# Patient Record
Sex: Female | Born: 1965 | Race: White | Hispanic: No | Marital: Married | Smoking: Never smoker
Health system: Southern US, Community
[De-identification: ages and names within clinical notes are randomized; demographics above are authoritative.]

## PROBLEM LIST (undated history)

## (undated) DIAGNOSIS — R519 Headache, unspecified: Secondary | ICD-10-CM

## (undated) DIAGNOSIS — K219 Gastro-esophageal reflux disease without esophagitis: Secondary | ICD-10-CM

## (undated) DIAGNOSIS — I10 Essential (primary) hypertension: Secondary | ICD-10-CM

## (undated) DIAGNOSIS — N920 Excessive and frequent menstruation with regular cycle: Secondary | ICD-10-CM

## (undated) DIAGNOSIS — R011 Cardiac murmur, unspecified: Secondary | ICD-10-CM

## (undated) DIAGNOSIS — F419 Anxiety disorder, unspecified: Secondary | ICD-10-CM

## (undated) DIAGNOSIS — I341 Nonrheumatic mitral (valve) prolapse: Secondary | ICD-10-CM

## (undated) DIAGNOSIS — T7840XA Allergy, unspecified, initial encounter: Secondary | ICD-10-CM

## (undated) DIAGNOSIS — N393 Stress incontinence (female) (male): Secondary | ICD-10-CM

## (undated) DIAGNOSIS — J45909 Unspecified asthma, uncomplicated: Secondary | ICD-10-CM

## (undated) DIAGNOSIS — Z960 Presence of urogenital implants: Secondary | ICD-10-CM

## (undated) DIAGNOSIS — N8110 Cystocele, unspecified: Secondary | ICD-10-CM

## (undated) DIAGNOSIS — R112 Nausea with vomiting, unspecified: Secondary | ICD-10-CM

## (undated) DIAGNOSIS — Z9889 Other specified postprocedural states: Secondary | ICD-10-CM

## (undated) DIAGNOSIS — T8859XA Other complications of anesthesia, initial encounter: Secondary | ICD-10-CM

## (undated) DIAGNOSIS — T4145XA Adverse effect of unspecified anesthetic, initial encounter: Secondary | ICD-10-CM

## (undated) HISTORY — DX: Anxiety disorder, unspecified: F41.9

## (undated) HISTORY — DX: Cardiac murmur, unspecified: R01.1

## (undated) HISTORY — DX: Essential (primary) hypertension: I10

## (undated) HISTORY — DX: Unspecified asthma, uncomplicated: J45.909

## (undated) HISTORY — PX: BREAST SURGERY: SHX581

## (undated) HISTORY — DX: Presence of urogenital implants: Z96.0

## (undated) HISTORY — DX: Excessive and frequent menstruation with regular cycle: N92.0

## (undated) HISTORY — DX: Nonrheumatic mitral (valve) prolapse: I34.1

## (undated) HISTORY — PX: FRACTURE SURGERY: SHX138

## (undated) HISTORY — PX: BREAST BIOPSY: SHX20

## (undated) HISTORY — DX: Allergy, unspecified, initial encounter: T78.40XA

## (undated) HISTORY — PX: POLYPECTOMY: SHX149

## (undated) HISTORY — DX: Stress incontinence (female) (male): N39.3

## (undated) HISTORY — PX: ABDOMINAL HYSTERECTOMY: SHX81

## (undated) HISTORY — DX: Cystocele, unspecified: N81.10

## (undated) HISTORY — PX: CHOLECYSTECTOMY: SHX55

---
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
## 2012-06-26 ENCOUNTER — Ambulatory Visit: Payer: Self-pay | Admitting: Obstetrics and Gynecology

## 2014-01-15 ENCOUNTER — Ambulatory Visit: Payer: Self-pay | Admitting: Obstetrics and Gynecology

## 2014-01-25 ENCOUNTER — Ambulatory Visit: Payer: Self-pay | Admitting: Obstetrics and Gynecology

## 2015-06-18 ENCOUNTER — Ambulatory Visit: Payer: Self-pay | Admitting: Family Medicine

## 2015-09-25 ENCOUNTER — Encounter: Payer: Self-pay | Admitting: Family Medicine

## 2015-09-25 ENCOUNTER — Ambulatory Visit (INDEPENDENT_AMBULATORY_CARE_PROVIDER_SITE_OTHER): Payer: Managed Care, Other (non HMO) | Admitting: Family Medicine

## 2015-09-25 VITALS — BP 124/78 | HR 102 | Temp 98.2°F | Resp 18 | Ht 64.0 in | Wt 201.5 lb

## 2015-09-25 DIAGNOSIS — J029 Acute pharyngitis, unspecified: Secondary | ICD-10-CM | POA: Insufficient documentation

## 2015-09-25 DIAGNOSIS — F419 Anxiety disorder, unspecified: Secondary | ICD-10-CM

## 2015-09-25 DIAGNOSIS — J302 Other seasonal allergic rhinitis: Secondary | ICD-10-CM | POA: Diagnosis not present

## 2015-09-25 DIAGNOSIS — I10 Essential (primary) hypertension: Secondary | ICD-10-CM | POA: Diagnosis not present

## 2015-09-25 DIAGNOSIS — R030 Elevated blood-pressure reading, without diagnosis of hypertension: Secondary | ICD-10-CM

## 2015-09-25 DIAGNOSIS — R011 Cardiac murmur, unspecified: Secondary | ICD-10-CM | POA: Insufficient documentation

## 2015-09-25 DIAGNOSIS — J0110 Acute frontal sinusitis, unspecified: Secondary | ICD-10-CM | POA: Diagnosis not present

## 2015-09-25 DIAGNOSIS — IMO0002 Reserved for concepts with insufficient information to code with codable children: Secondary | ICD-10-CM | POA: Insufficient documentation

## 2015-09-25 DIAGNOSIS — R059 Cough, unspecified: Secondary | ICD-10-CM | POA: Insufficient documentation

## 2015-09-25 DIAGNOSIS — J321 Chronic frontal sinusitis: Secondary | ICD-10-CM | POA: Insufficient documentation

## 2015-09-25 DIAGNOSIS — F418 Other specified anxiety disorders: Secondary | ICD-10-CM | POA: Insufficient documentation

## 2015-09-25 DIAGNOSIS — R058 Other specified cough: Secondary | ICD-10-CM | POA: Insufficient documentation

## 2015-09-25 DIAGNOSIS — J019 Acute sinusitis, unspecified: Secondary | ICD-10-CM | POA: Insufficient documentation

## 2015-09-25 DIAGNOSIS — R05 Cough: Secondary | ICD-10-CM | POA: Insufficient documentation

## 2015-09-25 DIAGNOSIS — R42 Dizziness and giddiness: Secondary | ICD-10-CM | POA: Insufficient documentation

## 2015-09-25 DIAGNOSIS — IMO0001 Reserved for inherently not codable concepts without codable children: Secondary | ICD-10-CM | POA: Insufficient documentation

## 2015-09-25 DIAGNOSIS — J45909 Unspecified asthma, uncomplicated: Secondary | ICD-10-CM | POA: Insufficient documentation

## 2015-09-25 DIAGNOSIS — J309 Allergic rhinitis, unspecified: Secondary | ICD-10-CM | POA: Insufficient documentation

## 2015-09-25 DIAGNOSIS — Z23 Encounter for immunization: Secondary | ICD-10-CM | POA: Insufficient documentation

## 2015-09-25 DIAGNOSIS — F411 Generalized anxiety disorder: Secondary | ICD-10-CM

## 2015-09-25 DIAGNOSIS — Z87898 Personal history of other specified conditions: Secondary | ICD-10-CM | POA: Insufficient documentation

## 2015-09-25 MED ORDER — ALPRAZOLAM 1 MG PO TABS: 1.0000 mg | ORAL_TABLET | Freq: Every day | ORAL | Status: DC | PRN

## 2015-09-25 MED ORDER — MONTELUKAST SODIUM 10 MG PO TABS: 10.0000 mg | ORAL_TABLET | Freq: Every day | ORAL | Status: DC

## 2015-09-25 MED ORDER — AZITHROMYCIN 250 MG PO TABS: ORAL_TABLET | ORAL | Status: DC

## 2015-09-25 MED ORDER — AMLODIPINE BESYLATE 5 MG PO TABS: 5.0000 mg | ORAL_TABLET | Freq: Every day | ORAL | Status: DC

## 2015-09-25 NOTE — Progress Notes (Signed)
Name: Mackenzie Bailey   MRN: [id number]    DOB: October 28, 1965   Date:09/25/2015       Progress Note  Subjective  Chief Complaint  Chief Complaint  Patient presents with  . URI    Sinusitis This is a new problem. The current episode started in the past 7 days. There has been no fever. Associated symptoms include congestion, headaches and sinus pressure. Pertinent negatives include no chills, shortness of breath or sore throat. Past treatments include oral decongestants.  Hypertension The problem is controlled. Associated symptoms include anxiety and headaches. Pertinent negatives include no blurred vision, chest pain, palpitations, shortness of breath or sweats. Past treatments include calcium channel blockers. The current treatment provides significant improvement. There is no history of kidney disease, CAD/MI or CVA.  Anxiety Presents for follow-up visit. Symptoms include nervous/anxious behavior and panic. Patient reports no chest pain, palpitations or shortness of breath. Symptoms occur occasionally. The severity of symptoms is mild.   Past treatments include benzodiazephines. The treatment provided moderate relief. Compliance with prior treatments has been good.    Past Medical History  Diagnosis Date  . Hypertension   . Allergy   . Anxiety   . Asthma   . Heart murmur   . Mitral valve disorder     Past Surgical History  Procedure Laterality Date  . Polypectomy      Family History  Problem Relation Age of Onset  . Arthritis Father   . Asthma Father   . Cancer Father     skin  . Cancer Sister     uterine  . Thyroid disease Sister   . Heart attack Maternal Grandfather   . Early death Maternal Grandfather     massive heart attack  . Arthritis Paternal Grandmother   . Cancer Paternal Grandmother     uterine  . Stroke Paternal Grandfather     Social History   Social History  . Marital Status: Married    Spouse Name: N/A  . Number of Children: N/A  . Years of  Education: N/A   Occupational History  . Not on file.   Social History Main Topics  . Smoking status: Never Smoker   . Smokeless tobacco: Not on file  . Alcohol Use: 0.0 oz/week    0 Standard drinks or equivalent per week     Comment: some weekends  . Drug Use: No  . Sexual Activity:    Partners: Male   Other Topics Concern  . Not on file   Social History Narrative  . No narrative on file     Current outpatient prescriptions:  .  ALPRAZolam (XANAX) 1 MG tablet, Take 1 mg by mouth at bedtime as needed for anxiety., Disp: , Rfl:  .  amLODipine (NORVASC) 5 MG tablet, , Disp: , Rfl:  .  montelukast (SINGULAIR) 10 MG tablet, Take 10 mg by mouth at bedtime., Disp: , Rfl:   Allergies  Allergen Reactions  . Penicillins Hives    Review of Systems  Constitutional: Negative for fever and chills.  HENT: Positive for congestion and sinus pressure. Negative for sore throat.   Eyes: Negative for blurred vision.  Respiratory: Positive for sputum production. Negative for shortness of breath.   Cardiovascular: Negative for chest pain and palpitations.  Neurological: Positive for headaches.  Psychiatric/Behavioral: The patient is nervous/anxious.     Objective  Filed Vitals:   09/18/15 1453 09/25/15 1442  BP: 121/84 124/78  Pulse:  102  Temp:  98.2 F (36.8  C)  TempSrc:  Oral  Resp:  18  Height:  5\' 4"  (1.626 m)  Weight:  201 lb 8 oz (91.4 kg)  SpO2:  98%    Physical Exam  Constitutional: She is oriented to person, place, and time and well-developed, well-nourished, and in no distress.  HENT:  Head: Normocephalic and atraumatic.  Right Ear: Tympanic membrane and ear canal normal.  Left Ear: Tympanic membrane and ear canal normal.  Nose: Mucosal edema present. Right sinus exhibits maxillary sinus tenderness and frontal sinus tenderness. Left sinus exhibits maxillary sinus tenderness and frontal sinus tenderness.  Mouth/Throat: Mucous membranes are normal. Posterior  oropharyngeal erythema present.  Cardiovascular: Normal rate and regular rhythm.   Pulmonary/Chest: Effort normal and breath sounds normal.  Neurological: She is alert and oriented to person, place, and time.  Psychiatric: Mood, memory, affect and judgment normal.  Nursing note and vitals reviewed.   Assessment & Plan  1. Essential hypertension BP stable and controlled on antihypertensive therapy - amLODipine (NORVASC) 5 MG tablet; Take 1 tablet (5 mg total) by mouth daily.  Dispense: 90 tablet; Refill: 0  2. Other seasonal allergic rhinitis  - montelukast (SINGULAIR) 10 MG tablet; Take 1 tablet (10 mg total) by mouth at bedtime.  Dispense: 90 tablet; Refill: 1  3. Acute frontal sinusitis, recurrence not specified Started on Z-Pak therapy for frontal sinusitis. - azithromycin (ZITHROMAX Z-PAK) 250 MG tablet; 2 tabs po x day 1, then 1 tab po q day x 4 days  Dispense: 6 each; Refill: 0  4. Anxiety Stable on alprazolam 1 mg daily as needed. Refills provided - ALPRAZolam (XANAX) 1 MG tablet; Take 1 tablet (1 mg total) by mouth daily as needed for anxiety.  Dispense: 30 tablet; Refill: 0   Mackenzie Bailey [facility] 09/25/2015 3:07 PM

## 2015-10-06 ENCOUNTER — Telehealth: Payer: Self-pay | Admitting: Family Medicine

## 2015-10-06 NOTE — Telephone Encounter (Signed)
Patient was seen by dr Manuella Ghazi about 2 weeks ago and still isn't any better. She is requesting a prescription for prednisone. Please send to [facility].

## 2015-10-08 MED ORDER — PREDNISONE 10 MG (21) PO TBPK
ORAL_TABLET | ORAL | Status: DC
Start: 2015-10-08 — End: 2015-10-30

## 2015-10-08 NOTE — Telephone Encounter (Signed)
Prednisone RX sent to Pharmacy.

## 2015-10-21 ENCOUNTER — Telehealth: Payer: Self-pay | Admitting: Family Medicine

## 2015-10-21 NOTE — Telephone Encounter (Signed)
Pt states she was here in Dec for a sick visit and her cough has not gotten any better. Pt request prednisone to be called into [facility]

## 2015-10-23 NOTE — Telephone Encounter (Signed)
Routed to Dr. Shah for approval 

## 2015-10-25 NOTE — Telephone Encounter (Signed)
Spoke with patient who explains that she is having coughing. Sinus pressure is better. Have recommended that she call on Monday 1/9 to make an appointment for Monday afternoon and will evaluate her symptoms at that time. Verbalized agreement.

## 2015-10-30 ENCOUNTER — Ambulatory Visit (INDEPENDENT_AMBULATORY_CARE_PROVIDER_SITE_OTHER): Payer: Managed Care, Other (non HMO) | Admitting: Family Medicine

## 2015-10-30 ENCOUNTER — Encounter: Payer: Self-pay | Admitting: Family Medicine

## 2015-10-30 VITALS — BP 130/78 | HR 105 | Temp 98.2°F | Resp 19 | Ht 64.0 in | Wt 203.4 lb

## 2015-10-30 DIAGNOSIS — J209 Acute bronchitis, unspecified: Secondary | ICD-10-CM

## 2015-10-30 MED ORDER — BENZONATATE 200 MG PO CAPS: 200.0000 mg | ORAL_CAPSULE | Freq: Three times a day (TID) | ORAL | Status: DC | PRN

## 2015-10-30 MED ORDER — PREDNISONE 10 MG (21) PO TBPK: 10.0000 mg | ORAL_TABLET | Freq: Every day | ORAL | Status: DC

## 2015-10-30 NOTE — Progress Notes (Signed)
Name: Mackenzie Bailey   MRN: [id number]    DOB: Feb 17, 1966   Date:10/30/2015       Progress Note  Subjective  Chief Complaint  Chief Complaint  Patient presents with  . Cough    Resp  . Hypertension  . Anxiety    Cough This is a recurrent problem. Episode onset: over 6 weeks. The problem has been unchanged. The cough is productive of sputum. Pertinent negatives include no chest pain, chills, ear pain, fever, postnasal drip, sore throat or shortness of breath. She has tried OTC cough suppressant for the symptoms. The treatment provided moderate relief.    diagnosed with acute sinusitis and started on Z-Pak 4 weeks ago. This resulted in improvement of symptoms. Past Medical History  Diagnosis Date  . Hypertension   . Allergy   . Anxiety   . Asthma   . Heart murmur   . Mitral valve disorder     Past Surgical History  Procedure Laterality Date  . Polypectomy      Family History  Problem Relation Age of Onset  . Arthritis Father   . Asthma Father   . Cancer Father     skin  . Cancer Sister     uterine  . Thyroid disease Sister   . Heart attack Maternal Grandfather   . Early death Maternal Grandfather     massive heart attack  . Arthritis Paternal Grandmother   . Cancer Paternal Grandmother     uterine  . Stroke Paternal Grandfather     Social History   Social History  . Marital Status: Married    Spouse Name: N/A  . Number of Children: N/A  . Years of Education: N/A   Occupational History  . Not on file.   Social History Main Topics  . Smoking status: Never Smoker   . Smokeless tobacco: Not on file  . Alcohol Use: 0.0 oz/week    0 Standard drinks or equivalent per week     Comment: some weekends  . Drug Use: No  . Sexual Activity:    Partners: Male   Other Topics Concern  . Not on file   Social History Narrative     Current outpatient prescriptions:  .  ALPRAZolam (XANAX) 1 MG tablet, Take 1 tablet (1 mg total) by mouth daily as needed for  anxiety., Disp: 30 tablet, Rfl: 0 .  amLODipine (NORVASC) 5 MG tablet, Take 1 tablet (5 mg total) by mouth daily., Disp: 90 tablet, Rfl: 0 .  montelukast (SINGULAIR) 10 MG tablet, Take 1 tablet (10 mg total) by mouth at bedtime., Disp: 90 tablet, Rfl: 1 .  azithromycin (ZITHROMAX Z-PAK) 250 MG tablet, 2 tabs po x day 1, then 1 tab po q day x 4 days (Patient not taking: Reported on 10/30/2015), Disp: 6 each, Rfl: 0 .  predniSONE (STERAPRED UNI-PAK 21 TAB) 10 MG (21) TBPK tablet, Use as directed by mouth is a 6 day taper PredPak (Patient not taking: Reported on 10/30/2015), Disp: 21 tablet, Rfl: 0  Allergies  Allergen Reactions  . Penicillins Hives     Review of Systems  Constitutional: Negative for fever and chills.  HENT: Negative for ear pain, postnasal drip and sore throat.   Respiratory: Positive for cough and sputum production. Negative for shortness of breath.   Cardiovascular: Negative for chest pain.    Objective  Filed Vitals:   10/30/15 1558  BP: 130/78  Pulse: 105  Temp: 98.2 F (36.8 C)  TempSrc: Oral  Resp: 19  Height: 5\' 4"  (1.626 m)  Weight: 203 lb 6.4 oz (92.262 kg)  SpO2: 97%    Physical Exam  Constitutional: She is oriented to person, place, and time and well-developed, well-nourished, and in no distress.  Cardiovascular: Normal rate and regular rhythm.   Pulmonary/Chest: Effort normal and breath sounds normal. She has no wheezes.  Neurological: She is alert and oriented to person, place, and time.  Nursing note and vitals reviewed.   Assessment & Plan  1. Acute bronchitis, unspecified organism   further ABx not indicated at this time. We'll start on 6 day course of prednisone and antitussive as needed.  - benzonatate (TESSALON) 200 MG capsule; Take 1 capsule (200 mg total) by mouth 3 (three) times daily as needed for cough.  Dispense: 30 capsule; Refill: 0 - predniSONE (STERAPRED UNI-PAK 21 TAB) 10 MG (21) TBPK tablet; Take 1 tablet (10 mg total) by  mouth daily. 60 50 40 30 20 10  then STOP  Dispense: 21 tablet; Refill: 0   Roseanne Juenger [facility] 10/30/2015 4:07 PM

## 2016-07-19 ENCOUNTER — Ambulatory Visit (INDEPENDENT_AMBULATORY_CARE_PROVIDER_SITE_OTHER): Payer: Managed Care, Other (non HMO) | Admitting: Family Medicine

## 2016-07-19 ENCOUNTER — Encounter: Payer: Self-pay | Admitting: Family Medicine

## 2016-07-19 DIAGNOSIS — F419 Anxiety disorder, unspecified: Secondary | ICD-10-CM

## 2016-07-19 DIAGNOSIS — J302 Other seasonal allergic rhinitis: Secondary | ICD-10-CM | POA: Diagnosis not present

## 2016-07-19 DIAGNOSIS — I10 Essential (primary) hypertension: Secondary | ICD-10-CM | POA: Diagnosis not present

## 2016-07-19 MED ORDER — AMLODIPINE BESYLATE 5 MG PO TABS: 5.0000 mg | ORAL_TABLET | Freq: Every day | ORAL | 0 refills | Status: DC

## 2016-07-19 MED ORDER — ALPRAZOLAM 1 MG PO TABS: 1.0000 mg | ORAL_TABLET | Freq: Every day | ORAL | 1 refills | Status: DC | PRN

## 2016-07-19 MED ORDER — MONTELUKAST SODIUM 10 MG PO TABS: 10.0000 mg | ORAL_TABLET | Freq: Every day | ORAL | 1 refills | Status: DC

## 2016-07-19 NOTE — Progress Notes (Signed)
Name: Mackenzie Bailey   MRN: [id number]    DOB: Mar 12, 1966   Date:07/19/2016       Progress Note  Subjective  Chief Complaint  Chief Complaint  Patient presents with  . Hypertension    medication refills  . Anxiety    Hypertension  This is a chronic problem. The problem is unchanged. The problem is controlled. Associated symptoms include anxiety. Pertinent negatives include no blurred vision, chest pain, palpitations or shortness of breath. Past treatments include calcium channel blockers.  Anxiety  Presents for follow-up visit. Symptoms include excessive worry, insomnia and nervous/anxious behavior. Patient reports no chest pain, palpitations or shortness of breath.      Past Medical History:  Diagnosis Date  . Allergy   . Anxiety   . Asthma   . Heart murmur   . Hypertension   . Mitral valve disorder     Past Surgical History:  Procedure Laterality Date  . POLYPECTOMY      Family History  Problem Relation Age of Onset  . Arthritis Father   . Asthma Father   . Cancer Father     skin  . Cancer Sister     uterine  . Thyroid disease Sister   . Heart attack Maternal Grandfather   . Early death Maternal Grandfather     massive heart attack  . Arthritis Paternal Grandmother   . Cancer Paternal Grandmother     uterine  . Stroke Paternal Grandfather     Social History   Social History  . Marital status: Married    Spouse name: N/A  . Number of children: N/A  . Years of education: N/A   Occupational History  . Not on file.   Social History Main Topics  . Smoking status: Never Smoker  . Smokeless tobacco: Not on file  . Alcohol use 0.0 oz/week     Comment: some weekends  . Drug use: No  . Sexual activity: Yes    Partners: Male   Other Topics Concern  . Not on file   Social History Narrative  . No narrative on file     Current Outpatient Prescriptions:  .  ALPRAZolam (XANAX) 1 MG tablet, Take 1 tablet (1 mg total) by mouth daily as needed for  anxiety., Disp: 30 tablet, Rfl: 0 .  amLODipine (NORVASC) 5 MG tablet, Take 1 tablet (5 mg total) by mouth daily., Disp: 90 tablet, Rfl: 0 .  montelukast (SINGULAIR) 10 MG tablet, Take 1 tablet (10 mg total) by mouth at bedtime., Disp: 90 tablet, Rfl: 1  Allergies  Allergen Reactions  . Penicillins Hives     Review of Systems  Eyes: Negative for blurred vision.  Respiratory: Negative for shortness of breath.   Cardiovascular: Negative for chest pain and palpitations.  Psychiatric/Behavioral: The patient is nervous/anxious and has insomnia.     Objective  Vitals:   07/19/16 1042  BP: 128/76  Pulse: 97  Resp: 16  Temp: 98.2 F (36.8 C)  TempSrc: Oral  SpO2: 97%  Weight: 197 lb 14.4 oz (89.8 kg)  Height: 5\' 4"  (1.626 m)    Physical Exam  Constitutional: She is well-developed, well-nourished, and in no distress.  HENT:  Head: Normocephalic and atraumatic.  Cardiovascular: Normal rate, regular rhythm, S1 normal, S2 normal and normal heart sounds.   No murmur heard. Pulmonary/Chest: Effort normal and breath sounds normal. She has no wheezes.  Musculoskeletal:       Right ankle: She exhibits no swelling.  Left ankle: She exhibits no swelling.  Nursing note and vitals reviewed.    Assessment & Plan  1. Anxiety  - ALPRAZolam (XANAX) 1 MG tablet; Take 1 tablet (1 mg total) by mouth daily as needed for anxiety.  Dispense: 30 tablet; Refill: 1  2. Essential hypertension  - amLODipine (NORVASC) 5 MG tablet; Take 1 tablet (5 mg total) by mouth daily.  Dispense: 90 tablet; Refill: 0  3. Other seasonal allergic rhinitis  - montelukast (SINGULAIR) 10 MG tablet; Take 1 tablet (10 mg total) by mouth at bedtime.  Dispense: 90 tablet; Refill: 1   Mackenzie Bailey [facility] 07/19/2016 10:58 AM

## 2016-09-27 ENCOUNTER — Encounter: Payer: Managed Care, Other (non HMO) | Admitting: Family Medicine

## 2016-11-09 ENCOUNTER — Encounter: Payer: Self-pay | Admitting: Family Medicine

## 2016-11-09 ENCOUNTER — Telehealth: Payer: Self-pay | Admitting: Family Medicine

## 2016-11-09 ENCOUNTER — Other Ambulatory Visit: Payer: Self-pay | Admitting: Family Medicine

## 2016-11-09 DIAGNOSIS — Z20828 Contact with and (suspected) exposure to other viral communicable diseases: Secondary | ICD-10-CM

## 2016-11-09 MED ORDER — OSELTAMIVIR PHOSPHATE 75 MG PO CAPS: 75.0000 mg | ORAL_CAPSULE | Freq: Every day | ORAL | 0 refills | Status: DC

## 2016-11-09 NOTE — Telephone Encounter (Signed)
Pt would like to know if she can get a RX for Tamiflu. 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 [employer].

## 2016-11-09 NOTE — Telephone Encounter (Signed)
Prescription for Tamiflu has been sent to your pharmacy.

## 2016-11-09 NOTE — Progress Notes (Signed)
tamiflu

## 2016-11-10 NOTE — Telephone Encounter (Signed)
LMOM to inform pt RX at pharmacy

## 2017-06-06 ENCOUNTER — Encounter: Payer: Self-pay | Admitting: Family Medicine

## 2017-06-06 ENCOUNTER — Ambulatory Visit (INDEPENDENT_AMBULATORY_CARE_PROVIDER_SITE_OTHER): Payer: Commercial Managed Care - PPO | Admitting: Family Medicine

## 2017-06-06 VITALS — BP 128/76 | HR 99 | Temp 97.6°F | Resp 16 | Ht 64.0 in | Wt 202.8 lb

## 2017-06-06 DIAGNOSIS — Z0000 Encounter for general adult medical examination without abnormal findings: Secondary | ICD-10-CM | POA: Diagnosis not present

## 2017-06-06 DIAGNOSIS — Z1211 Encounter for screening for malignant neoplasm of colon: Secondary | ICD-10-CM | POA: Diagnosis not present

## 2017-06-06 NOTE — Progress Notes (Signed)
Name: Mackenzie Bailey   MRN: [id number]    DOB: Oct 29, 1965   Date:06/06/2017       Progress Note  Subjective  Chief Complaint  Chief Complaint  Patient presents with  . Annual Exam    patient needs forms for work to be completed. sleeps about 7 hrs/ night. patient stated that she does not eat much.   . Labs Only    patient is not fasting.    HPI  Pt. presents for complete physical exam sponsored by her employer She is in otherwise good health.. She is due for first screening colonoscopy, will order today.  She has a gynecologist for Pap smear and mammogram.    Past Medical History:  Diagnosis Date  . Allergy   . Anxiety   . Asthma   . Heart murmur   . Hypertension   . Mitral valve disorder     Past Surgical History:  Procedure Laterality Date  . POLYPECTOMY      Family History  Problem Relation Age of Onset  . Arthritis Father   . Asthma Father   . Cancer Father        skin  . Cancer Sister        uterine  . Thyroid disease Sister   . Heart attack Maternal Grandfather   . Early death Maternal Grandfather        massive heart attack  . Arthritis Paternal Grandmother   . Cancer Paternal Grandmother        uterine  . Stroke Paternal Grandfather     Social History   Social History  . Marital status: Married    Spouse name: N/A  . Number of children: N/A  . Years of education: N/A   Occupational History  . Not on file.   Social History Main Topics  . Smoking status: Never Smoker  . Smokeless tobacco: Never Used  . Alcohol use 0.0 oz/week     Comment: some weekends  . Drug use: No  . Sexual activity: Yes    Partners: Male    Birth control/ protection: None   Other Topics Concern  . Not on file   Social History Narrative  . No narrative on file     Current Outpatient Prescriptions:  .  ALPRAZolam (XANAX) 1 MG tablet, Take 1 tablet (1 mg total) by mouth daily as needed for anxiety., Disp: 30 tablet, Rfl: 1 .  amLODipine (NORVASC) 5 MG tablet,  Take 1 tablet (5 mg total) by mouth daily., Disp: 90 tablet, Rfl: 0 .  montelukast (SINGULAIR) 10 MG tablet, Take 1 tablet (10 mg total) by mouth at bedtime., Disp: 90 tablet, Rfl: 1  Allergies  Allergen Reactions  . Penicillins Hives     Review of Systems  Constitutional: Positive for malaise/fatigue. Negative for chills and fever.  HENT: Negative for congestion, ear pain and sore throat.   Eyes: Negative for blurred vision and double vision.  Respiratory: Negative for cough and shortness of breath (chronic asthma, no recent flare ups).   Cardiovascular: Negative for chest pain and leg swelling.  Gastrointestinal: Negative for abdominal pain, blood in stool, constipation, diarrhea, nausea and vomiting.  Genitourinary: Negative for dysuria and hematuria.  Musculoskeletal: Negative for back pain and neck pain.  Neurological: Negative for dizziness and headaches.  Psychiatric/Behavioral: Negative for depression. The patient is nervous/anxious. The patient does not have insomnia.     Objective  Vitals:   06/06/17 1433  BP: 128/76  Pulse: 99  Resp:  16  Temp: 97.6 F (36.4 C)  TempSrc: Oral  SpO2: 99%  Weight: 202 lb 12.8 oz (92 kg)  Height: 5\' 4"  (1.626 m)    Physical Exam  Constitutional: She is oriented to person, place, and time and well-developed, well-nourished, and in no distress.  HENT:  Head: Normocephalic and atraumatic.  Right Ear: External ear normal.  Left Ear: External ear normal.  Mouth/Throat: Oropharynx is clear and moist.  Eyes: Pupils are equal, round, and reactive to light.  Neck: Neck supple.  Cardiovascular: Normal rate, regular rhythm and normal heart sounds.   No murmur heard. Pulmonary/Chest: Effort normal and breath sounds normal. She has no wheezes.  Abdominal: Soft. Bowel sounds are normal. There is no tenderness.  Musculoskeletal: She exhibits no edema.  Neurological: She is alert and oriented to person, place, and time.  Psychiatric: Mood,  memory, affect and judgment normal.  Nursing note and vitals reviewed.     Assessment & Plan  1. Well woman exam without gynecological exam  - TSH - VITAMIN D 25 Hydroxy (Vit-D Deficiency, Fractures) - Lipid panel - COMPLETE METABOLIC PANEL WITH GFR - CBC with Differential/Platelet  2. Screening for colon cancer  - Ambulatory referral to Gastroenterology     Dossie Der [facility] 06/06/2017 2:46 PM

## 2017-07-18 ENCOUNTER — Ambulatory Visit: Payer: Self-pay | Admitting: Obstetrics and Gynecology

## 2017-07-18 ENCOUNTER — Encounter: Payer: Self-pay | Admitting: Obstetrics and Gynecology

## 2017-07-18 ENCOUNTER — Ambulatory Visit (INDEPENDENT_AMBULATORY_CARE_PROVIDER_SITE_OTHER): Payer: Commercial Managed Care - PPO | Admitting: Obstetrics and Gynecology

## 2017-07-18 VITALS — BP 150/110 | HR 82 | Ht 64.0 in | Wt 203.0 lb

## 2017-07-18 DIAGNOSIS — N3001 Acute cystitis with hematuria: Secondary | ICD-10-CM | POA: Diagnosis not present

## 2017-07-18 LAB — POCT URINALYSIS DIPSTICK
BILIRUBIN UA: NEGATIVE
GLUCOSE UA: NEGATIVE
KETONES UA: NEGATIVE
NITRITE UA: NEGATIVE
Spec Grav, UA: 1.01 (ref 1.010–1.025)
pH, UA: 7 (ref 5.0–8.0)

## 2017-07-18 MED ORDER — NITROFURANTOIN MONOHYD MACRO 100 MG PO CAPS: 100.0000 mg | ORAL_CAPSULE | Freq: Two times a day (BID) | ORAL | 0 refills | Status: AC

## 2017-07-18 NOTE — Progress Notes (Signed)
Chief Complaint  Patient presents with  . Urinary Tract Infection    HPI:      [name] is a 51 y.o. G1P1001 who LMP was Patient's last menstrual period was 07/04/2017 (exact date)., presents today for UTI sx for the past 4 days. Pt notes frequency, urgency, LBP and suprapubic pressure. No dysuria/vaginal bleeding. No fevers, no vag sx. No meds to treat. Hx of UTIs as a child but none recently.    Past Medical History:  Diagnosis Date  . Allergy   . Anxiety   . Asthma   . Heart murmur   . Hypertension   . Mitral valve disorder     Past Surgical History:  Procedure Laterality Date  . POLYPECTOMY      Family History  Problem Relation Age of Onset  . Arthritis Father   . Asthma Father   . Cancer Father        skin  . Cancer Sister        uterine  . Thyroid disease Sister   . Heart attack Maternal Grandfather   . Early death Maternal Grandfather        massive heart attack  . Arthritis Paternal Grandmother   . Cancer Paternal Grandmother        uterine  . Stroke Paternal Grandfather     Social History   Social History  . Marital status: Married    Spouse name: N/A  . Number of children: N/A  . Years of education: N/A   Occupational History  . Not on file.   Social History Main Topics  . Smoking status: Never Smoker  . Smokeless tobacco: Never Used  . Alcohol use 0.0 oz/week     Comment: some weekends  . Drug use: No  . Sexual activity: Yes    Partners: Male    Birth control/ protection: IUD   Other Topics Concern  . Not on file   Social History Narrative  . No narrative on file     Current Outpatient Prescriptions:  .  ALPRAZolam (XANAX) 1 MG tablet, Take 1 tablet (1 mg total) by mouth daily as needed for anxiety., Disp: 30 tablet, Rfl: 1 .  amLODipine (NORVASC) 5 MG tablet, Take 1 tablet (5 mg total) by mouth daily., Disp: 90 tablet, Rfl: 0 .  levonorgestrel (MIRENA) 20 MCG/24HR IUD, 1 each by Intrauterine route once., Disp: , Rfl:  .   montelukast (SINGULAIR) 10 MG tablet, Take 1 tablet (10 mg total) by mouth at bedtime., Disp: 90 tablet, Rfl: 1 .  nitrofurantoin, macrocrystal-monohydrate, (MACROBID) 100 MG capsule, Take 1 capsule (100 mg total) by mouth 2 (two) times daily., Disp: 14 capsule, Rfl: 0   ROS:  Review of Systems  Constitutional: Negative for fever.  Gastrointestinal: Negative for blood in stool, constipation, diarrhea, nausea and vomiting.  Genitourinary: Positive for dysuria, frequency and urgency. Negative for dyspareunia, flank pain, hematuria, vaginal bleeding, vaginal discharge and vaginal pain.  Musculoskeletal: Negative for back pain.  Skin: Negative for rash.     OBJECTIVE:   Vitals:  BP (!) 150/110   Pulse 82   Ht 5\' 4"  (1.626 m)   Wt 203 lb (92.1 kg)   LMP 07/04/2017 (Exact Date)   BMI 34.84 kg/m   Physical Exam  Constitutional: She is oriented to person, place, and time and well-developed, well-nourished, and in no distress.  Abdominal: There is no CVA tenderness.  Neurological: She is alert and oriented to person, place, and time.  Psychiatric: Affect and judgment normal.  Vitals reviewed.   Results: Results for orders placed or performed in visit on 07/18/17 (from the past 24 hour(s))  POCT Urinalysis Dipstick     Status: Abnormal   Collection Time: 07/18/17 11:42 AM  Result Value Ref Range   Color, UA yellow    Clarity, UA     Glucose, UA neg    Bilirubin, UA neg    Ketones, UA neg    Spec Grav, UA 1.010 1.010 - 1.025   Blood, UA large    pH, UA 7.0 5.0 - 8.0   Protein, UA trace    Urobilinogen, UA  0.2 or 1.0 E.U./dL   Nitrite, UA neg    Leukocytes, UA Moderate (2+) (A) Negative     Assessment/Plan: Acute cystitis with hematuria - Rx macrobid. Chk C&S. F/u prn.  - Plan: nitrofurantoin, macrocrystal-monohydrate, (MACROBID) 100 MG capsule, Urine Culture, POCT Urinalysis Dipstick    Return in about 1 month (around 08/18/2017), or if symptoms worsen or fail to  improve, for annual.  Alicia B. Copland, PA-C 07/18/2017 11:43 AM

## 2017-07-20 LAB — URINE CULTURE

## 2017-07-21 ENCOUNTER — Telehealth: Payer: Self-pay | Admitting: Family Medicine

## 2017-07-21 NOTE — Telephone Encounter (Signed)
Please have pt scheduled for medication f/u appt per Dr.Shah, thanks

## 2017-07-21 NOTE — Telephone Encounter (Signed)
Left voice message for patient to schedule an appointment per Dr Manuella Ghazi request.

## 2017-07-21 NOTE — Telephone Encounter (Signed)
Please advise 

## 2017-07-21 NOTE — Telephone Encounter (Signed)
Pt was seen in August and is asking that you please refill on amlodipine and alprazolam. Please send to [facility] [address] Abbott Pao ask that we give her a call when the prescriptions has been sent to the pharmacy.

## 2017-07-21 NOTE — Telephone Encounter (Signed)
She was seen in August for a comprehensive physical exam and not for medication management. Please schedule for for medication management and refills

## 2017-07-26 ENCOUNTER — Ambulatory Visit (INDEPENDENT_AMBULATORY_CARE_PROVIDER_SITE_OTHER): Payer: Commercial Managed Care - PPO | Admitting: Obstetrics and Gynecology

## 2017-07-26 ENCOUNTER — Encounter: Payer: Self-pay | Admitting: Obstetrics and Gynecology

## 2017-07-26 VITALS — BP 134/92 | HR 88 | Ht 64.0 in | Wt 201.0 lb

## 2017-07-26 DIAGNOSIS — N3001 Acute cystitis with hematuria: Secondary | ICD-10-CM | POA: Diagnosis not present

## 2017-07-26 LAB — POCT URINALYSIS DIPSTICK
BILIRUBIN UA: NEGATIVE
Glucose, UA: NEGATIVE
KETONES UA: NEGATIVE
Nitrite, UA: NEGATIVE
PH UA: 8 (ref 5.0–8.0)
SPEC GRAV UA: 1.01 (ref 1.010–1.025)

## 2017-07-26 MED ORDER — CIPROFLOXACIN HCL 500 MG PO TABS: 500.0000 mg | ORAL_TABLET | Freq: Two times a day (BID) | ORAL | 0 refills | Status: DC

## 2017-07-26 NOTE — Progress Notes (Signed)
Chief Complaint  Patient presents with  . Urinary Tract Infection    continued sxs after completing abx    HPI:      [name] is a 51 y.o. G1P1001 who LMP was Patient's last menstrual period was 07/04/2017 (exact date)., presents today for f/u on UTI from last wk. Pt had E.Coli on 07/18/17 C&S and was treated with macrobid. Culture confirmed sensitivity to abx. Pt's sx improved and then returned this morning, 1 day after taking last abx. Has dysuria/frequency/urgency with suprapubic pressure. No LBP, fevers, vag sx.  Pt has pessary. Hasn't had UTI in "years" until last wk.    Past Medical History:  Diagnosis Date  . Allergy   . Anxiety   . Asthma   . Heart murmur   . Hypertension   . Mitral valve disorder     Past Surgical History:  Procedure Laterality Date  . POLYPECTOMY      Family History  Problem Relation Age of Onset  . Arthritis Father   . Asthma Father   . Cancer Father        skin  . Cancer Sister        uterine  . Thyroid disease Sister   . Heart attack Maternal Grandfather   . Early death Maternal Grandfather        massive heart attack  . Arthritis Paternal Grandmother   . Cancer Paternal Grandmother        uterine  . Stroke Paternal Grandfather     Social History   Social History  . Marital status: Married    Spouse name: N/A  . Number of children: N/A  . Years of education: N/A   Occupational History  . Not on file.   Social History Main Topics  . Smoking status: Never Smoker  . Smokeless tobacco: Never Used  . Alcohol use 0.0 oz/week     Comment: some weekends  . Drug use: No  . Sexual activity: Yes    Partners: Male    Birth control/ protection: IUD   Other Topics Concern  . Not on file   Social History Narrative  . No narrative on file     Current Outpatient Prescriptions:  .  ALPRAZolam (XANAX) 1 MG tablet, Take 1 tablet (1 mg total) by mouth daily as needed for anxiety., Disp: 30 tablet, Rfl: 1 .  amLODipine  (NORVASC) 5 MG tablet, Take 1 tablet (5 mg total) by mouth daily., Disp: 90 tablet, Rfl: 0 .  levonorgestrel (MIRENA) 20 MCG/24HR IUD, 1 each by Intrauterine route once., Disp: , Rfl:  .  montelukast (SINGULAIR) 10 MG tablet, Take 1 tablet (10 mg total) by mouth at bedtime., Disp: 90 tablet, Rfl: 1 .  ciprofloxacin (CIPRO) 500 MG tablet, Take 1 tablet (500 mg total) by mouth 2 (two) times daily., Disp: 10 tablet, Rfl: 0   ROS:  Review of Systems  Constitutional: Negative for fever.  Gastrointestinal: Negative for blood in stool, constipation, diarrhea, nausea and vomiting.  Genitourinary: Positive for dysuria, frequency and pelvic pain. Negative for dyspareunia, flank pain, hematuria, urgency, vaginal bleeding, vaginal discharge and vaginal pain.  Musculoskeletal: Negative for back pain.  Skin: Negative for rash.     OBJECTIVE:   Vitals:  BP (!) 134/92   Pulse 88   Ht 5\' 4"  (1.626 m)   Wt 201 lb (91.2 kg)   LMP 07/04/2017 (Exact Date)   BMI 34.50 kg/m   Physical Exam  Constitutional: She is oriented  to person, place, and time and well-developed, well-nourished, and in no distress.  Abdominal: There is no CVA tenderness.  Neurological: She is alert and oriented to person, place, and time.  Psychiatric: Affect and judgment normal.  Vitals reviewed.   Results: Results for orders placed or performed in visit on 07/26/17 (from the past 24 hour(s))  POCT Urinalysis Dipstick     Status: Abnormal   Collection Time: 07/26/17  3:04 PM  Result Value Ref Range   Color, UA yellow    Clarity, UA     Glucose, UA neg    Bilirubin, UA neg    Ketones, UA neg    Spec Grav, UA 1.010 1.010 - 1.025   Blood, UA small    pH, UA 8.0 5.0 - 8.0   Protein, UA trace    Urobilinogen, UA  0.2 or 1.0 E.U./dL   Nitrite, UA neg    Leukocytes, UA Moderate (2+) (A) Negative     Assessment/Plan: Acute cystitis with hematuria - Recurrent from last wk. Rx cipro. Check C&S. Discussed pelvic tilt  with voiding to fully empty bladder. If sx recur, question position of pessary causing sx. - Plan: POCT Urinalysis Dipstick, Urine Culture, ciprofloxacin (CIPRO) 500 MG tablet    Meds ordered this encounter  Medications  . ciprofloxacin (CIPRO) 500 MG tablet    Sig: Take 1 tablet (500 mg total) by mouth 2 (two) times daily.    Dispense:  10 tablet    Refill:  0      Return if symptoms worsen or fail to improve.  Oneika Simonian B. Phillips Goulette, PA-C 07/26/2017 3:07 PM

## 2017-07-29 LAB — URINE CULTURE

## 2017-08-01 ENCOUNTER — Ambulatory Visit: Payer: Self-pay | Admitting: Obstetrics and Gynecology

## 2017-08-02 ENCOUNTER — Ambulatory Visit (INDEPENDENT_AMBULATORY_CARE_PROVIDER_SITE_OTHER): Payer: Commercial Managed Care - PPO | Admitting: Family Medicine

## 2017-08-02 ENCOUNTER — Encounter: Payer: Self-pay | Admitting: Family Medicine

## 2017-08-02 VITALS — BP 132/84 | HR 94 | Temp 98.4°F | Resp 16 | Ht 64.0 in | Wt 202.7 lb

## 2017-08-02 DIAGNOSIS — I10 Essential (primary) hypertension: Secondary | ICD-10-CM | POA: Diagnosis not present

## 2017-08-02 DIAGNOSIS — J4540 Moderate persistent asthma, uncomplicated: Secondary | ICD-10-CM | POA: Diagnosis not present

## 2017-08-02 DIAGNOSIS — J302 Other seasonal allergic rhinitis: Secondary | ICD-10-CM | POA: Diagnosis not present

## 2017-08-02 DIAGNOSIS — F419 Anxiety disorder, unspecified: Secondary | ICD-10-CM | POA: Diagnosis not present

## 2017-08-02 MED ORDER — MONTELUKAST SODIUM 10 MG PO TABS: 10.0000 mg | ORAL_TABLET | Freq: Every day | ORAL | 1 refills | Status: DC

## 2017-08-02 MED ORDER — NEBULIZER DEVI: 1.0000 | 0 refills | Status: DC

## 2017-08-02 MED ORDER — AMLODIPINE BESYLATE 5 MG PO TABS: 5.0000 mg | ORAL_TABLET | Freq: Every day | ORAL | 0 refills | Status: DC

## 2017-08-02 MED ORDER — ALBUTEROL SULFATE (2.5 MG/3ML) 0.083% IN NEBU: 2.5000 mg | INHALATION_SOLUTION | Freq: Four times a day (QID) | RESPIRATORY_TRACT | 12 refills | Status: AC | PRN

## 2017-08-02 MED ORDER — ALPRAZOLAM 1 MG PO TABS: 1.0000 mg | ORAL_TABLET | Freq: Every day | ORAL | 0 refills | Status: DC | PRN

## 2017-08-02 NOTE — Progress Notes (Signed)
Name: Mackenzie Bailey   MRN: [id number]    DOB: October 19, 1965   Date:08/03/2017       Progress Note  Subjective  Chief Complaint  Chief Complaint  Patient presents with  . Medication Refill    xanax, amlodipine, singular   . Asthma    Pt is asking for a Rx for a nebualizer machine and solution. Pt starting to have asthma flare ups     Asthma  She complains of cough, frequent throat clearing, sputum production and wheezing. There is no shortness of breath. This is a recurrent problem. The problem has been gradually worsening (worse after the change in weather). The cough is productive of sputum. Pertinent negatives include no chest pain or headaches. Her symptoms are aggravated by change in weather. Her symptoms are alleviated by beta-agonist and leukotriene antagonist (needs prescription for nebulizer machine and liquid). Her past medical history is significant for asthma.  Anxiety  Presents for follow-up visit. Symptoms include nervous/anxious behavior. Patient reports no chest pain, depressed mood, excessive worry, insomnia, palpitations or shortness of breath. Primary symptoms comment: anxious with her son who is growing up and will soon start driving, also gets anxious when her husband is traveling.. The severity of symptoms is mild.   Her past medical history is significant for asthma.  Hypertension  This is a chronic problem. The problem is unchanged. The problem is controlled. Associated symptoms include anxiety. Pertinent negatives include no blurred vision, chest pain, headaches, palpitations or shortness of breath. Past treatments include calcium channel blockers.    Past Medical History:  Diagnosis Date  . Allergy   . Anxiety   . Asthma   . Heart murmur   . Hypertension   . Mitral valve disorder     Past Surgical History:  Procedure Laterality Date  . POLYPECTOMY      Family History  Problem Relation Age of Onset  . Arthritis Father   . Asthma Father   . Cancer Father         skin  . Cancer Sister        uterine  . Thyroid disease Sister   . Heart attack Maternal Grandfather   . Early death Maternal Grandfather        massive heart attack  . Arthritis Paternal Grandmother   . Cancer Paternal Grandmother        uterine  . Stroke Paternal Grandfather     Social History   Social History  . Marital status: Married    Spouse name: N/A  . Number of children: N/A  . Years of education: N/A   Occupational History  . Not on file.   Social History Main Topics  . Smoking status: Never Smoker  . Smokeless tobacco: Never Used  . Alcohol use 0.0 oz/week     Comment: some weekends  . Drug use: No  . Sexual activity: Yes    Partners: Male    Birth control/ protection: IUD   Other Topics Concern  . Not on file   Social History Narrative  . No narrative on file     Current Outpatient Prescriptions:  .  ALPRAZolam (XANAX) 1 MG tablet, Take 1 tablet (1 mg total) by mouth daily as needed for anxiety., Disp: 30 tablet, Rfl: 0 .  amLODipine (NORVASC) 5 MG tablet, Take 1 tablet (5 mg total) by mouth daily., Disp: 90 tablet, Rfl: 0 .  levonorgestrel (MIRENA) 20 MCG/24HR IUD, 1 each by Intrauterine route once., Disp: , Rfl:  .  montelukast (SINGULAIR) 10 MG tablet, Take 1 tablet (10 mg total) by mouth at bedtime., Disp: 90 tablet, Rfl: 1 .  albuterol (PROVENTIL) (2.5 MG/3ML) 0.083% nebulizer solution, Take 3 mLs (2.5 mg total) by nebulization every 6 (six) hours as needed for wheezing or shortness of breath., Disp: 75 mL, Rfl: 12 .  Respiratory Therapy Supplies (NEBULIZER) DEVI, 1 Device by Does not apply route as directed., Disp: 1 each, Rfl: 0  Allergies  Allergen Reactions  . Penicillins Hives     Review of Systems  Eyes: Negative for blurred vision.  Respiratory: Positive for cough, sputum production and wheezing. Negative for shortness of breath.   Cardiovascular: Negative for chest pain and palpitations.  Neurological: Negative for  headaches.  Psychiatric/Behavioral: The patient is nervous/anxious. The patient does not have insomnia.      Objective  Vitals:   08/03/17 0850  BP: 132/84  Pulse: 94  Resp: 16  Temp: 98.4 F (36.9 C)  TempSrc: Oral  SpO2: 97%  Weight: 202 lb 11.2 oz (91.9 kg)  Height: 5\' 4"  (1.626 m)    Physical Exam  Constitutional: She is oriented to person, place, and time and well-developed, well-nourished, and in no distress.  HENT:  Head: Normocephalic and atraumatic.  Cardiovascular: Normal rate, regular rhythm and normal heart sounds.   Pulmonary/Chest: Effort normal and breath sounds normal. No respiratory distress. She has no wheezes.  Abdominal: Soft. Bowel sounds are normal. There is no tenderness.  Musculoskeletal: She exhibits edema.  Neurological: She is alert and oriented to person, place, and time.  Psychiatric: Mood, memory, affect and judgment normal.  Nursing note and vitals reviewed.     Assessment & Plan  1. Anxiety Symptoms stable on alprazolam taken daily as needed for anxiety, patient compliant with controlled substances agreement - ALPRAZolam (XANAX) 1 MG tablet; Take 1 tablet (1 mg total) by mouth daily as needed for anxiety.  Dispense: 30 tablet; Refill: 0  2. Essential hypertension  - amLODipine (NORVASC) 5 MG tablet; Take 1 tablet (5 mg total) by mouth daily.  Dispense: 90 tablet; Refill: 0  3. Other seasonal allergic rhinitis  - montelukast (SINGULAIR) 10 MG tablet; Take 1 tablet (10 mg total) by mouth at bedtime.  Dispense: 90 tablet; Refill: 1  4. Moderate persistent asthma without complication  Advised to use nebulizer for treatment, prescription sent to pharmacy  - Respiratory Therapy Supplies (NEBULIZER) DEVI; 1 Device by Does not apply route as directed.  Dispense: 1 each; Refill: 0 - albuterol (PROVENTIL) (2.5 MG/3ML) 0.083% nebulizer solution; Take 3 mLs (2.5 mg total) by nebulization every 6 (six) hours as needed for wheezing or shortness  of breath.  Dispense: 75 mL; Refill: 12   Mackenzie Bailey [facility] 08/03/2017 9:09 PM

## 2017-09-12 ENCOUNTER — Ambulatory Visit: Payer: Commercial Managed Care - PPO | Admitting: Obstetrics and Gynecology

## 2017-10-31 ENCOUNTER — Other Ambulatory Visit: Payer: Self-pay | Admitting: Family Medicine

## 2017-10-31 DIAGNOSIS — I10 Essential (primary) hypertension: Secondary | ICD-10-CM

## 2017-11-07 ENCOUNTER — Ambulatory Visit: Payer: Commercial Managed Care - PPO | Admitting: Obstetrics and Gynecology

## 2017-12-05 ENCOUNTER — Ambulatory Visit (INDEPENDENT_AMBULATORY_CARE_PROVIDER_SITE_OTHER): Payer: Commercial Managed Care - PPO | Admitting: Obstetrics and Gynecology

## 2017-12-05 ENCOUNTER — Other Ambulatory Visit: Payer: Self-pay

## 2017-12-05 ENCOUNTER — Encounter: Payer: Self-pay | Admitting: Obstetrics and Gynecology

## 2017-12-05 VITALS — BP 130/80 | HR 84 | Ht 64.0 in | Wt 214.0 lb

## 2017-12-05 DIAGNOSIS — Z01419 Encounter for gynecological examination (general) (routine) without abnormal findings: Secondary | ICD-10-CM

## 2017-12-05 DIAGNOSIS — Z1231 Encounter for screening mammogram for malignant neoplasm of breast: Secondary | ICD-10-CM

## 2017-12-05 DIAGNOSIS — Z713 Dietary counseling and surveillance: Secondary | ICD-10-CM

## 2017-12-05 DIAGNOSIS — N393 Stress incontinence (female) (male): Secondary | ICD-10-CM | POA: Diagnosis not present

## 2017-12-05 DIAGNOSIS — Z1239 Encounter for other screening for malignant neoplasm of breast: Secondary | ICD-10-CM

## 2017-12-05 DIAGNOSIS — R195 Other fecal abnormalities: Secondary | ICD-10-CM | POA: Diagnosis not present

## 2017-12-05 DIAGNOSIS — Z4689 Encounter for fitting and adjustment of other specified devices: Secondary | ICD-10-CM | POA: Diagnosis not present

## 2017-12-05 DIAGNOSIS — Z1211 Encounter for screening for malignant neoplasm of colon: Secondary | ICD-10-CM | POA: Diagnosis not present

## 2017-12-05 LAB — HEMOCCULT GUIAC POC 1CARD (OFFICE): Fecal Occult Blood, POC: POSITIVE — AB

## 2017-12-05 NOTE — Patient Instructions (Signed)
I value your feedback and entrusting us with your care. If you get a [place] patient survey, I would appreciate you taking the time to let us know about your experience today. Thank you! 

## 2017-12-05 NOTE — Progress Notes (Signed)
PCP: Roselee Nova, MD   Chief Complaint  Patient presents with  . Gynecologic Exam    Hormonal changes/weight gain    HPI:      Mackenzie Bailey is a 52 y.o. G1P1001 who LMP was Patient's last menstrual period was 10/15/2017 (approximate)., presents today for her annual examination.  Her menses are regular every 28-30 days, lasting 2 days.  Dysmenorrhea mild, occurring first 1-2 days of flow. She does not have intermenstrual bleeding. She has missed a couple months of periods this yr. Had Mirena placed 2016 for menorrhagia--couldn't have endometrial ablation at time of hysteroscopy/polypectomy in [facility]. She does have vasomotor sx. Also notes irritability and wt gain.   Sex activity: single partner, contraception - IUD. She does not have vaginal dryness. Mirena placed ~8/16 in [facility]  She has a cystocele with pessary for SUI. Still has SUI with certain positions, as well as urge incont and random leakage. Voids as frequently as she can at work, some caffeine daily. Pessary helps with cystocele. Hasn't done pelvic PT, considering bladder surg. Doesn't clean pessary on her own. Last done at 10/17 appt with me.  Last Pap: August 02, 2016  Results were: no abnormalities /neg HPV DNA.  Hx of STDs: none  Last mammogram: not in a couple yrs There is no FH of breast cancer. There is no FH of ovarian cancer. The patient does not do self-breast exams.  Colonoscopy: not yet.   Tobacco use: The patient denies current or previous tobacco use. Alcohol use: social drinker Exercise: moderately active  She does get adequate calcium but not Vitamin D in her diet.  Labs with PCP. Up 18# since 10/17. Not exercising specifically. Trying to eat better but no calorie counting.  Past Medical History:  Diagnosis Date  . Allergy   . Anxiety   . Asthma   . Female cystocele   . Heart murmur   . Hypertension   . Menorrhagia   . Mitral valve disorder   . SUI (stress urinary incontinence, female)   .  Vaginal pessary present     Past Surgical History:  Procedure Laterality Date  . POLYPECTOMY      Family History  Problem Relation Age of Onset  . Arthritis Father   . Asthma Father   . Cancer Father        skin  . Cancer Sister        uterine  . Thyroid disease Sister   . Heart attack Maternal Grandfather   . Early death Maternal Grandfather        massive heart attack  . Arthritis Paternal Grandmother   . Cancer Paternal Grandmother        uterine  . Stroke Paternal Grandfather     Social History   Socioeconomic History  . Marital status: Married    Spouse name: Not on file  . Number of children: Not on file  . Years of education: Not on file  . Highest education level: Not on file  Social Needs  . Financial resource strain: Not on file  . Food insecurity - worry: Not on file  . Food insecurity - inability: Not on file  . Transportation needs - medical: Not on file  . Transportation needs - non-medical: Not on file  Occupational History  . Not on file  Tobacco Use  . Smoking status: Never Smoker  . Smokeless tobacco: Never Used  Substance and Sexual Activity  . Alcohol use: Yes  Alcohol/week: 0.0 oz    Comment: some weekends  . Drug use: No  . Sexual activity: Yes    Partners: Male    Birth control/protection: IUD  Other Topics Concern  . Not on file  Social History Narrative  . Not on file    Current Meds  Medication Sig  . albuterol (PROVENTIL) (2.5 MG/3ML) 0.083% nebulizer solution Take 3 mLs (2.5 mg total) by nebulization every 6 (six) hours as needed for wheezing or shortness of breath.  . ALPRAZolam (XANAX) 1 MG tablet Take 1 tablet (1 mg total) by mouth daily as needed for anxiety.  Marland Kitchen amLODipine (NORVASC) 5 MG tablet TAKE 1 TABLET BY MOUTH EVERY DAY  . levonorgestrel (MIRENA) 20 MCG/24HR IUD 1 each by Intrauterine route once.  . montelukast (SINGULAIR) 10 MG tablet Take 1 tablet (10 mg total) by mouth at bedtime.  Marland Kitchen Respiratory Therapy  Supplies (NEBULIZER) DEVI 1 Device by Does not apply route as directed.      ROS:  Review of Systems  Constitutional: Positive for fatigue. Negative for fever and unexpected weight change.  Respiratory: Negative for cough, shortness of breath and wheezing.   Cardiovascular: Negative for chest pain, palpitations and leg swelling.  Gastrointestinal: Negative for blood in stool, constipation, diarrhea, nausea and vomiting.  Endocrine: Negative for cold intolerance, heat intolerance and polyuria.  Genitourinary: Positive for difficulty urinating and urgency. Negative for dyspareunia, dysuria, flank pain, frequency, genital sores, hematuria, menstrual problem, pelvic pain, vaginal bleeding, vaginal discharge and vaginal pain.  Musculoskeletal: Negative for back pain, joint swelling and myalgias.  Skin: Negative for rash.  Neurological: Negative for dizziness, syncope, light-headedness, numbness and headaches.  Hematological: Negative for adenopathy.  Psychiatric/Behavioral: Negative for agitation, confusion, sleep disturbance and suicidal ideas. The patient is not nervous/anxious.      Objective: BP 130/80   Pulse 84   Ht 5\' 4"  (1.626 m)   Wt 214 lb (97.1 kg)   LMP 10/15/2017 (Approximate)   BMI 36.73 kg/m    Physical Exam  Constitutional: She is oriented to person, place, and time. She appears well-developed and well-nourished.  Genitourinary: Vagina normal and uterus normal. There is no rash or tenderness on the right labia. There is no rash or tenderness on the left labia. No erythema or tenderness in the vagina. No vaginal discharge found. Right adnexum does not display mass and does not display tenderness. Left adnexum does not display mass and does not display tenderness.  Cervix exhibits visible IUD strings. Cervix does not exhibit motion tenderness or polyp. Uterus is not enlarged or tender. Rectal exam shows guaiac positive stool. Rectal exam shows no fissure, no mass and no  tenderness.  Genitourinary Comments: PESSARY IN PLACE; REMOVED FOR CLEANING AND REPLACEMENT  Neck: Normal range of motion. No thyromegaly present.  Cardiovascular: Normal rate, regular rhythm and normal heart sounds.  No murmur heard. Pulmonary/Chest: Effort normal and breath sounds normal. Right breast exhibits no mass, no nipple discharge, no skin change and no tenderness. Left breast exhibits no mass, no nipple discharge, no skin change and no tenderness.  Abdominal: Soft. There is no tenderness. There is no guarding.  Musculoskeletal: Normal range of motion.  Neurological: She is alert and oriented to person, place, and time. No cranial nerve deficit.  Psychiatric: She has a normal mood and affect. Her behavior is normal.  Vitals reviewed.   Results: Results for orders placed or performed in visit on 12/05/17 (from the past 24 hour(s))  POCT Occult Blood Stool  Status: Abnormal   Collection Time: 12/05/17  2:55 PM  Result Value Ref Range   Fecal Occult Blood, POC Positive (A) Negative   Card #1 Date     Card #2 Fecal Occult Blod, POC     Card #2 Date     Card #3 Fecal Occult Blood, POC     Card #3 Date      Assessment/Plan:  Encounter for annual routine gynecological examination  Screening for breast cancer - Pt to sched mammo. - Plan: MM DIGITAL SCREENING BILATERAL  Screening for colon cancer - Pos FOBT today. REfer to GI for scr colonoscopy due to age. - Plan: POCT Occult Blood Stool, Ambulatory referral to Gastroenterology  Heme positive stool - Plan: POCT Occult Blood Stool, Ambulatory referral to Gastroenterology  Pessary maintenance - Cleaned and replaced today. RTO in 6 mo cleaning/maintenance.  SUI (stress urinary incontinence, female) - Discussed pelvic PT or surg. F/u with MD for surg conf prn.  Weight loss counseling, encounter for - [employer]/exercise. If no wt loss, f/u with PCP 3/19 for labs.        GYN counsel breast self exam, mammography screening,  menopause, adequate intake of calcium and vitamin D, diet and exercise    F/U  Return in about 1 year (around 12/05/2018).  Alicia B. Copland, PA-C 12/05/2017 3:01 PM

## 2017-12-19 ENCOUNTER — Other Ambulatory Visit: Payer: Self-pay | Admitting: *Deleted

## 2017-12-19 ENCOUNTER — Inpatient Hospital Stay
Admission: RE | Admit: 2017-12-19 | Discharge: 2017-12-19 | Disposition: A | Discharge status: Home / Self Care | Payer: Self-pay | Source: Ambulatory Visit | Attending: *Deleted | Admitting: *Deleted

## 2017-12-19 DIAGNOSIS — Z9289 Personal history of other medical treatment: Secondary | ICD-10-CM

## 2018-01-02 ENCOUNTER — Ambulatory Visit: Payer: Commercial Managed Care - PPO | Admitting: Family Medicine

## 2018-01-02 ENCOUNTER — Ambulatory Visit
Admission: RE | Admit: 2018-01-02 | Discharge: 2018-01-02 | Disposition: A | Discharge status: Home / Self Care | Payer: Commercial Managed Care - PPO | Source: Ambulatory Visit | Attending: Obstetrics and Gynecology | Admitting: Obstetrics and Gynecology

## 2018-01-02 ENCOUNTER — Encounter: Payer: Self-pay | Admitting: Family Medicine

## 2018-01-02 ENCOUNTER — Encounter: Payer: Self-pay | Admitting: Obstetrics and Gynecology

## 2018-01-02 VITALS — BP 124/80 | HR 90 | Temp 97.7°F | Resp 16 | Ht 64.0 in | Wt 212.0 lb

## 2018-01-02 DIAGNOSIS — E669 Obesity, unspecified: Secondary | ICD-10-CM | POA: Diagnosis not present

## 2018-01-02 DIAGNOSIS — Z808 Family history of malignant neoplasm of other organs or systems: Secondary | ICD-10-CM | POA: Diagnosis not present

## 2018-01-02 DIAGNOSIS — R195 Other fecal abnormalities: Secondary | ICD-10-CM

## 2018-01-02 DIAGNOSIS — J301 Allergic rhinitis due to pollen: Secondary | ICD-10-CM

## 2018-01-02 DIAGNOSIS — Z1239 Encounter for other screening for malignant neoplasm of breast: Secondary | ICD-10-CM

## 2018-01-02 DIAGNOSIS — J4520 Mild intermittent asthma, uncomplicated: Secondary | ICD-10-CM

## 2018-01-02 DIAGNOSIS — Z1231 Encounter for screening mammogram for malignant neoplasm of breast: Secondary | ICD-10-CM | POA: Diagnosis not present

## 2018-01-02 DIAGNOSIS — Z6836 Body mass index (BMI) 36.0-36.9, adult: Secondary | ICD-10-CM | POA: Diagnosis not present

## 2018-01-02 DIAGNOSIS — E66812 Obesity, class 2: Secondary | ICD-10-CM

## 2018-01-02 DIAGNOSIS — I10 Essential (primary) hypertension: Secondary | ICD-10-CM | POA: Diagnosis not present

## 2018-01-02 DIAGNOSIS — F419 Anxiety disorder, unspecified: Secondary | ICD-10-CM | POA: Diagnosis not present

## 2018-01-02 MED ORDER — CITALOPRAM HYDROBROMIDE 20 MG PO TABS: 20.0000 mg | ORAL_TABLET | Freq: Every day | ORAL | 3 refills | Status: DC

## 2018-01-02 NOTE — Assessment & Plan Note (Signed)
Uncontrolled Taking Xanax prn, not frequently Needs better baseline control Discussed SSRIs for this purpose Discussed possible side effects - can take 6-8 weeks to reach full efficacy, possible GI upset or decreased libido Start celexa 20mg  daily  F/u in 2 months, repeat PHQ and GAD7 Can consider dose increase if needed at that time Continue xanax sparingly in the meantime

## 2018-01-02 NOTE — Patient Instructions (Signed)

## 2018-01-02 NOTE — Assessment & Plan Note (Signed)
Well controlled Continue singulair Can consider addition of flonase or antihistamine in the future

## 2018-01-02 NOTE — Assessment & Plan Note (Signed)
No concerning lesions currently Advised seeing Derm annually for full skin exam Advised on sun protection

## 2018-01-02 NOTE — Assessment & Plan Note (Signed)
Has appt for colonoscopy with GI Check CBC

## 2018-01-02 NOTE — Progress Notes (Signed)
Patient: Mackenzie Bailey, Female    DOB: 07/09/1966, 52 y.o.   MRN: [id number] Visit Date: 01/02/2018  Today's Provider: Lavon Paganini, MD   I, Martha Clan, CMA, am acting as scribe for Lavon Paganini, MD.  Chief Complaint  Patient presents with  . Establish Care   Subjective:    Establish Care Mackenzie Bailey is a 52 y.o. female who presents today to establish care. She feels well. She reports exercising none, but is active at work at [employer]. She is concerned because she has gained several pounds since turning 51 YO. She reports she is sleeping fairly well.  Her PMH includes anxiety (which is worsening due to family stressors such as having a teenage, and a husband who travels for work). She also has HTN, RAD, and a H/O mitral-valve prolapse, which is stable per pt.  Pt has a mammogram scheduled for today. She has an appointment to see GI for colonoscopy on 01/31/2018. Had FOBT+ stool at GYN She sees [facility] for OB/GYN.  Weight gain: Walks at work, but no formal exercise - states that she has never crossed over 200lbs in the past - since turning 52 - also more fatigue - too tired to exercise once getting home - more abdominal girth - tends to eat when bored or watching TV or when stressed - knows that she needs to cut back on this  Anxiety: Seems to have increased with age - home is stressful due to raising a teen - started on Xanax for perimenopausal irritability to take with menses - tries not to take Xanax unless absolutely necessary - states she still has ~20 pills from a fill in October 2018.  Asthma/allergies: Seems to be worse during high allergy seasons - well controlled, has not needed albuterol at all this year - taking Singulair regularly - no h/o hospitalization/intubation  HTN: - Medications: amlodipine 5mg  daily - Compliance: good - Checking BP at home: no - Denies any SOB, CP, vision changes, [place] edema, medication SEs,  or symptoms of hypotension -----------------------------------------------------------------   Review of Systems  Constitutional: Negative.   HENT: Positive for sinus pressure. Negative for congestion, dental problem, drooling, ear discharge, ear pain, facial swelling, hearing loss, mouth sores, nosebleeds, postnasal drip, rhinorrhea, sinus pain, sneezing, sore throat, tinnitus, trouble swallowing and voice change.   Eyes: Negative.   Respiratory: Negative.   Cardiovascular: Negative.   Gastrointestinal: Positive for diarrhea. Negative for abdominal distention, abdominal pain, anal bleeding, blood in stool, constipation, nausea, rectal pain and vomiting.  Endocrine: Negative.   Genitourinary: Positive for urgency. Negative for decreased urine volume, difficulty urinating, dyspareunia, dysuria, enuresis, flank pain, frequency, genital sores, hematuria, menstrual problem, pelvic pain, vaginal bleeding, vaginal discharge and vaginal pain.  Musculoskeletal: Negative.   Skin: Negative.   Allergic/Immunologic: Negative.   Neurological: Negative.   Hematological: Negative.   Psychiatric/Behavioral: Negative.     Social History      She  reports that  has never smoked. she has never used smokeless tobacco. She reports that she drinks about 0.6 - 1.2 oz of alcohol per week. She reports that she does not use drugs.       Social History   Socioeconomic History  . Marital status: Married    Spouse name: Phu  . Number of children: 1  . Years of education: 27  . Highest education level: Some college, no degree  Social Needs  . Financial resource strain: Not hard at all  .  Food insecurity - worry: Never true  . Food insecurity - inability: Never true  . Transportation needs - medical: No  . Transportation needs - non-medical: No  Occupational History    Employer: [employer]  Tobacco Use  . Smoking status: Never Smoker  . Smokeless tobacco: Never Used  Substance and Sexual Activity  .  Alcohol use: Yes    Alcohol/week: 0.6 - 1.2 oz    Types: 1 - 2 Glasses of wine per week  . Drug use: No  . Sexual activity: Yes    Partners: Male    Birth control/protection: IUD  Other Topics Concern  . None  Social History Narrative  . None    Past Medical History:  Diagnosis Date  . Allergy   . Anxiety   . Asthma   . Female cystocele   . Heart murmur   . Hypertension   . Menorrhagia   . Mitral valve prolapse   . SUI (stress urinary incontinence, female)   . Vaginal pessary present      Patient Active Problem List   Diagnosis Date Noted  . Family history of melanoma 01/02/2018  . Class 2 obesity without serious comorbidity with body mass index (BMI) of 36.0 to 36.9 in adult 01/02/2018  . Heme positive stool 12/05/2017  . Pessary maintenance 12/05/2017  . Allergic rhinitis 09/25/2015  . Asthma 09/25/2015  . Cardiac murmur 09/25/2015  . Essential hypertension 09/25/2015  . Anxiety 09/25/2015    Past Surgical History:  Procedure Laterality Date  . BREAST SURGERY     benign biobsy  . FRACTURE SURGERY     R lower leg  . POLYPECTOMY     uterine, found on sonohystogram, contributed to menorrhagia     Family History        Family Status  Relation Name Status  . Father  Alive  . Sister  Alive  . MGF  Deceased  . PGM  Deceased  . PGF  Deceased  . Mother  Alive  . Son  Alive  . MGM  Deceased  . Neg Hx  (Not Specified)        Her family history includes Arthritis in her father and paternal grandmother; Asthma in her father; Cancer in her paternal grandmother; Early death in her maternal grandfather; Heart attack in her maternal grandfather; Hypertension in her mother; Skin cancer in her father; Stroke in her paternal grandfather; Thyroid disease in her sister; Uterine cancer in her sister. There is no history of Colon cancer or Breast cancer.      Allergies  Allergen Reactions  . Penicillins Hives     Current Outpatient Medications:  .  albuterol  (PROVENTIL) (2.5 MG/3ML) 0.083% nebulizer solution, Take 3 mLs (2.5 mg total) by nebulization every 6 (six) hours as needed for wheezing or shortness of breath., Disp: 75 mL, Rfl: 12 .  ALPRAZolam (XANAX) 1 MG tablet, Take 1 tablet (1 mg total) by mouth daily as needed for anxiety., Disp: 30 tablet, Rfl: 0 .  amLODipine (NORVASC) 5 MG tablet, TAKE 1 TABLET BY MOUTH EVERY DAY, Disp: 90 tablet, Rfl: 0 .  levonorgestrel (MIRENA) 20 MCG/24HR IUD, 1 each by Intrauterine route once., Disp: , Rfl:  .  montelukast (SINGULAIR) 10 MG tablet, Take 1 tablet (10 mg total) by mouth at bedtime., Disp: 90 tablet, Rfl: 1 .  Respiratory Therapy Supplies (NEBULIZER) DEVI, 1 Device by Does not apply route as directed., Disp: 1 each, Rfl: 0 .  citalopram (CELEXA) 20  MG tablet, Take 1 tablet (20 mg total) by mouth daily., Disp: 30 tablet, Rfl: 3   Patient Care Team: Virginia Crews, MD as PCP - General (Family Medicine)      Objective:   Vitals: BP 124/80 (BP Location: Left Arm, Patient Position: Sitting, Cuff Size: Large)   Pulse 90   Temp 97.7 F (36.5 C) (Oral)   Resp 16   Ht 5\' 4"  (1.626 m)   Wt 212 lb (96.2 kg)   SpO2 98%   BMI 36.39 kg/m    Vitals:   01/02/18 0908  BP: 124/80  Pulse: 90  Resp: 16  Temp: 97.7 F (36.5 C)  TempSrc: Oral  SpO2: 98%  Weight: 212 lb (96.2 kg)  Height: 5\' 4"  (1.626 m)     Physical Exam  Constitutional: She is oriented to person, place, and time. She appears well-developed and well-nourished. No distress.  HENT:  Head: Normocephalic and atraumatic.  Right Ear: External ear normal.  Left Ear: External ear normal.  Nose: Nose normal.  Mouth/Throat: Oropharynx is clear and moist. No oropharyngeal exudate.  Eyes: Conjunctivae and EOM are normal. Pupils are equal, round, and reactive to light. Right eye exhibits no discharge. Left eye exhibits no discharge. No scleral icterus.  Neck: Neck supple. No thyromegaly present.  Cardiovascular: Normal rate, regular  rhythm, normal heart sounds and intact distal pulses.  No murmur heard. Pulmonary/Chest: Effort normal and breath sounds normal. No respiratory distress. She has no wheezes. She has no rales.  Abdominal: Soft. She exhibits no distension. There is no tenderness.  Musculoskeletal: She exhibits no edema or deformity.  Lymphadenopathy:    She has no cervical adenopathy.  Neurological: She is alert and oriented to person, place, and time. No cranial nerve deficit.  Skin: Skin is warm and dry. No rash noted.  Psychiatric: She has a normal mood and affect. Her speech is normal and behavior is normal. Judgment normal. Cognition and memory are normal. She expresses no homicidal and no suicidal ideation.  Vitals reviewed.    Depression Screen PHQ 2/9 Scores 01/02/2018 08/02/2017 06/06/2017 10/30/2015  PHQ - 2 Score 0 0 0 0      Assessment & Plan:    Problem List Items Addressed This Visit      Cardiovascular and Mediastinum   Essential hypertension - Primary    Well controlled Continue amlodipine 5mg  daily F/u in 6 months Screen for DM, HLD, CKD      Relevant Orders   Lipid panel   Comprehensive metabolic panel     Respiratory   Allergic rhinitis    Well controlled Continue singulair Can consider addition of flonase or antihistamine in the future      Asthma    Well controlled Continue singulair, albuterol prn States that inhalers such as ICS have not worked in the past        Other   Anxiety    Uncontrolled Taking Xanax prn, not frequently Needs better baseline control Discussed SSRIs for this purpose Discussed possible side effects - can take 6-8 weeks to reach full efficacy, possible GI upset or decreased libido Start celexa 20mg  daily  F/u in 2 months, repeat PHQ and GAD7 Can consider dose increase if needed at that time Continue xanax sparingly in the meantime      Relevant Medications   citalopram (CELEXA) 20 MG tablet   Other Relevant Orders   TSH   Heme  positive stool    Has appt for colonoscopy with  GI Check CBC      Relevant Orders   CBC   Family history of melanoma    No concerning lesions currently Advised seeing Derm annually for full skin exam Advised on sun protection      Class 2 obesity without serious comorbidity with body mass index (BMI) of 36.0 to 36.9 in adult    Discussed healthy weight management, diet and exercise Patient seems to have trouble with overeating Discussed mindful eating Control of anxiety may help some with this Could consider Topamax at f/u if continues to struggle with this      Relevant Orders   TSH       Return in about 2 months (around 03/04/2018) for anxiety.   The entirety of the information documented in the History of Present Illness, Review of Systems and Physical Exam were personally obtained by me. Portions of this information were initially documented by Raquel Sarna Ratchford, CMA and reviewed by me for thoroughness and accuracy.    Virginia Crews, MD, MPH [facility] 01/02/2018 10:44 AM

## 2018-01-02 NOTE — Assessment & Plan Note (Signed)
Discussed healthy weight management, diet and exercise Patient seems to have trouble with overeating Discussed mindful eating Control of anxiety may help some with this Could consider Topamax at f/u if continues to struggle with this

## 2018-01-02 NOTE — Assessment & Plan Note (Signed)
Well controlled Continue singulair, albuterol prn States that inhalers such as ICS have not worked in the past

## 2018-01-02 NOTE — Assessment & Plan Note (Signed)
Well controlled Continue amlodipine 5mg  daily F/u in 6 months Screen for DM, HLD, CKD

## 2018-01-24 ENCOUNTER — Other Ambulatory Visit: Payer: Self-pay | Admitting: Family Medicine

## 2018-01-24 DIAGNOSIS — J302 Other seasonal allergic rhinitis: Secondary | ICD-10-CM

## 2018-01-24 NOTE — Telephone Encounter (Signed)
Please review

## 2018-01-26 ENCOUNTER — Other Ambulatory Visit: Payer: Self-pay | Admitting: Family Medicine

## 2018-01-26 DIAGNOSIS — F419 Anxiety disorder, unspecified: Secondary | ICD-10-CM

## 2018-01-26 MED ORDER — CITALOPRAM HYDROBROMIDE 20 MG PO TABS: 20.0000 mg | ORAL_TABLET | Freq: Every day | ORAL | 3 refills | Status: DC

## 2018-01-26 NOTE — Telephone Encounter (Signed)
LOV 01/02/2018. Has FU scheduled 03/27/2018.

## 2018-01-26 NOTE — Telephone Encounter (Signed)
[facility] faxed a refill request for a 90-days supply for the following medication. Thanks CC  citalopram (CELEXA) 20 MG tablet

## 2018-01-31 ENCOUNTER — Other Ambulatory Visit: Payer: Self-pay

## 2018-01-31 ENCOUNTER — Ambulatory Visit (INDEPENDENT_AMBULATORY_CARE_PROVIDER_SITE_OTHER): Payer: Commercial Managed Care - PPO | Admitting: Gastroenterology

## 2018-01-31 ENCOUNTER — Encounter: Payer: Self-pay | Admitting: Gastroenterology

## 2018-01-31 VITALS — BP 126/85 | HR 82 | Temp 97.5°F | Ht 64.0 in | Wt 209.4 lb

## 2018-01-31 DIAGNOSIS — R195 Other fecal abnormalities: Secondary | ICD-10-CM

## 2018-01-31 NOTE — Progress Notes (Signed)
Gastroenterology Consultation  Referring Provider:     Chad Cordial, PA-C Primary Care Physician:  Virginia Crews, MD Primary Gastroenterologist:  Dr. Allen Norris     Reason for Consultation:     Heme positive stools        HPI:   Mackenzie Bailey is a 52 y.o. y/o female referred for consultation & management of Heme positive stools by Dr. Brita Romp, Dionne Bucy, MD.  This patient comes in today after having stool cards sent off back in February of this year that were shown to be positive.  The patient denies seeing any blood in her stools.  She denies any abdominal pain nausea vomiting fevers or chills.  The patient also denies any unexplained weight loss.  There is no change in bowel habits.  The patient also does not have a recent CBC although one was ordered back in March of this year. The patient denies ever having a colonoscopy in the past.  Past Medical History:  Diagnosis Date  . Allergy   . Anxiety   . Asthma   . Female cystocele   . Heart murmur   . Hypertension   . Menorrhagia   . Mitral valve prolapse   . SUI (stress urinary incontinence, female)   . Vaginal pessary present     Past Surgical History:  Procedure Laterality Date  . BREAST BIOPSY Right 2012?   benign  . BREAST SURGERY     benign biobsy  . FRACTURE SURGERY     R lower leg  . POLYPECTOMY     uterine, found on sonohystogram, contributed to menorrhagia     Prior to Admission medications   Medication Sig Start Date End Date Taking? Authorizing Provider  albuterol (PROVENTIL) (2.5 MG/3ML) 0.083% nebulizer solution Take 3 mLs (2.5 mg total) by nebulization every 6 (six) hours as needed for wheezing or shortness of breath. 08/02/17  Yes Roselee Nova, MD  ALPRAZolam Duanne Moron) 1 MG tablet Take 1 tablet (1 mg total) by mouth daily as needed for anxiety. 08/02/17  Yes Rochel Brome A, MD  amLODipine (NORVASC) 5 MG tablet TAKE 1 TABLET BY MOUTH EVERY DAY 10/31/17  Yes Roselee Nova, MD    citalopram (CELEXA) 20 MG tablet Take 1 tablet (20 mg total) by mouth daily. 01/26/18  Yes Bacigalupo, Dionne Bucy, MD  levonorgestrel (MIRENA) 20 MCG/24HR IUD 1 each by Intrauterine route once. 10/18/14  Yes [provider]  montelukast (SINGULAIR) 10 MG tablet TAKE 1 TABLET BY MOUTH EVERYDAY AT BEDTIME 01/25/18  Yes Bacigalupo, Dionne Bucy, MD  Respiratory Therapy Supplies (NEBULIZER) DEVI 1 Device by Does not apply route as directed. 08/02/17  Yes Roselee Nova, MD    Family History  Problem Relation Age of Onset  . Arthritis Father   . Asthma Father   . Skin cancer Father        melanoma  . Thyroid disease Sister   . Uterine cancer Sister   . Heart attack Maternal Grandfather   . Early death Maternal Grandfather        massive heart attack  . Arthritis Paternal Grandmother   . Cancer Paternal Grandmother        uterine  . Stroke Paternal Grandfather   . Hypertension Mother   . Colon cancer Neg Hx   . Breast cancer Neg Hx      Social History   Tobacco Use  . Smoking status: Never Smoker  . Smokeless  tobacco: Never Used  Substance Use Topics  . Alcohol use: Yes    Alcohol/week: 0.6 - 1.2 oz    Types: 1 - 2 Glasses of wine per week  . Drug use: No    Allergies as of 01/31/2018 - Review Complete 01/31/2018  Allergen Reaction Noted  . Penicillins Hives 09/25/2015    Review of Systems:    All systems reviewed and negative except where noted in HPI.   Physical Exam:  BP 126/85   Pulse 82   Temp (!) 97.5 F (36.4 C) (Oral)   Ht 5\' 4"  (1.626 m)   Wt 209 lb 6.4 oz (95 kg)   BMI 35.94 kg/m  No LMP recorded. (Menstrual status: IUD). Psych:  Alert and cooperative. Normal mood and affect. General:   Alert,  Well-developed, well-nourished, pleasant and cooperative in NAD Head:  Normocephalic and atraumatic. Eyes:  Sclera clear, no icterus.   Conjunctiva pink. Ears:  Normal auditory acuity. Nose:  No deformity, discharge, or lesions. Mouth:  No deformity or  lesions,oropharynx pink & moist. Neck:  Supple; no masses or thyromegaly. Lungs:  Respirations even and unlabored.  Clear throughout to auscultation.   No wheezes, crackles, or rhonchi. No acute distress. Heart:  Regular rate and rhythm; no murmurs, clicks, rubs, or gallops. Abdomen:  Normal bowel sounds.  No bruits.  Soft, non-tender and non-distended without masses, hepatosplenomegaly or hernias noted.  No guarding or rebound tenderness.  Negative Carnett sign.   Rectal:  Deferred.  Msk:  Symmetrical without gross deformities.  Good, equal movement & strength bilaterally. Pulses:  Normal pulses noted. Extremities:  No clubbing or edema.  No cyanosis. Neurologic:  Alert and oriented x3;  grossly normal neurologically. Skin:  Intact without significant lesions or rashes.  No jaundice. Lymph Nodes:  No significant cervical adenopathy. Psych:  Alert and cooperative. Normal mood and affect.  Imaging Studies: Mm Screening Breast Tomo Bilateral  Result Date: 01/02/2018 CLINICAL DATA:  Screening. EXAM: DIGITAL SCREENING BILATERAL MAMMOGRAM WITH TOMO AND CAD COMPARISON:  Previous exam(s). ACR Breast Density Category a: The breast tissue is almost entirely fatty. FINDINGS: There are no findings suspicious for malignancy. Images were processed with CAD. IMPRESSION: No mammographic evidence of malignancy. A result letter of this screening mammogram will be mailed directly to the patient. RECOMMENDATION: Screening mammogram in one year. (Code:SM-B-01Y) BI-RADS CATEGORY  1: Negative. Electronically Signed   By: Nolon Nations M.D.   On: 01/02/2018 12:52    Assessment and Plan:   Mackenzie Bailey is a 52 y.o. y/o female who comes in with a history of heme positive stool on her stool cards. The patient has never had a colonoscopy in the past.  She has no worry symptoms such as change in bowel habits weight loss or family history of colon cancer colon polyps. Nevertheless the patient will be set up for  a colonoscopy to look for a cause of the possible blood in his stools. I have discussed risks & benefits which include, but are not limited to, bleeding, infection, perforation & drug reaction.  The patient agrees with this plan & written consent will be obtained.     Lucilla Lame, MD. Marval Regal   Note: This dictation was prepared with Dragon dictation along with smaller phrase technology. Any transcriptional errors that result from this process are unintentional.

## 2018-02-01 ENCOUNTER — Other Ambulatory Visit: Payer: Self-pay

## 2018-02-01 DIAGNOSIS — Z1211 Encounter for screening for malignant neoplasm of colon: Secondary | ICD-10-CM

## 2018-02-13 ENCOUNTER — Other Ambulatory Visit: Payer: Self-pay | Admitting: Family Medicine

## 2018-02-13 DIAGNOSIS — F419 Anxiety disorder, unspecified: Secondary | ICD-10-CM

## 2018-02-13 MED ORDER — CITALOPRAM HYDROBROMIDE 20 MG PO TABS
20.0000 mg | ORAL_TABLET | Freq: Every day | ORAL | 1 refills | Status: DC
Start: 2018-02-13 — End: 2018-03-27

## 2018-02-13 NOTE — Telephone Encounter (Signed)
Was just prescribed #30 on 01/26/2018.

## 2018-02-13 NOTE — Telephone Encounter (Signed)
[facility] faxed a refill request for a 90-days supply for the following medication. Thanks CC  citalopram (CELEXA) 20 MG tablet

## 2018-02-15 ENCOUNTER — Other Ambulatory Visit: Payer: Self-pay

## 2018-02-22 NOTE — Discharge Instructions (Signed)
General Anesthesia, Adult, Care After °These instructions provide you with information about caring for yourself after your procedure. Your health care provider may also give you more specific instructions. Your treatment has been planned according to current medical practices, but problems sometimes occur. Call your health care provider if you have any problems or questions after your procedure. °What can I expect after the procedure? °After the procedure, it is common to have: °· Vomiting. °· A sore throat. °· Mental slowness. ° °It is common to feel: °· Nauseous. °· Cold or shivery. °· Sleepy. °· Tired. °· Sore or achy, even in parts of your body where you did not have surgery. ° °Follow these instructions at home: °For at least 24 hours after the procedure: °· Do not: °? Participate in activities where you could fall or become injured. °? Drive. °? Use heavy machinery. °? Drink alcohol. °? Take sleeping pills or medicines that cause drowsiness. °? Make important decisions or sign legal documents. °? Take care of children on your own. °· Rest. °Eating and drinking °· If you vomit, drink water, juice, or soup when you can drink without vomiting. °· Drink enough fluid to keep your urine clear or pale yellow. °· Make sure you have little or no nausea before eating solid foods. °· Follow the diet recommended by your health care provider. °General instructions °· Have a responsible adult stay with you until you are awake and alert. °· Return to your normal activities as told by your health care provider. Ask your health care provider what activities are safe for you. °· Take over-the-counter and prescription medicines only as told by your health care provider. °· If you smoke, do not smoke without supervision. °· Keep all follow-up visits as told by your health care provider. This is important. °Contact a health care provider if: °· You continue to have nausea or vomiting at home, and medicines are not helpful. °· You  cannot drink fluids or start eating again. °· You cannot urinate after 8-12 hours. °· You develop a skin rash. °· You have fever. °· You have increasing redness at the site of your procedure. °Get help right away if: °· You have difficulty breathing. °· You have chest pain. °· You have unexpected bleeding. °· You feel that you are having a life-threatening or urgent problem. °This information is not intended to replace advice given to you by your health care provider. Make sure you discuss any questions you have with your health care provider. °Document Released: 01/10/2001 Document Revised: 03/08/2016 Document Reviewed: 09/18/2015 °Elsevier Interactive Patient Education © 2018 [employer] ° °

## 2018-02-24 ENCOUNTER — Encounter
Admission: RE | Disposition: A | Discharge status: Home / Self Care | Payer: Self-pay | Source: Ambulatory Visit | Attending: Gastroenterology

## 2018-02-24 ENCOUNTER — Ambulatory Visit: Payer: Commercial Managed Care - PPO | Admitting: Anesthesiology

## 2018-02-24 ENCOUNTER — Ambulatory Visit
Admission: RE | Admit: 2018-02-24 | Discharge: 2018-02-24 | Disposition: A | Discharge status: Home / Self Care | Payer: Commercial Managed Care - PPO | Source: Ambulatory Visit | Attending: Gastroenterology | Admitting: Gastroenterology

## 2018-02-24 DIAGNOSIS — D122 Benign neoplasm of ascending colon: Secondary | ICD-10-CM

## 2018-02-24 DIAGNOSIS — J45909 Unspecified asthma, uncomplicated: Secondary | ICD-10-CM | POA: Insufficient documentation

## 2018-02-24 DIAGNOSIS — K5730 Diverticulosis of large intestine without perforation or abscess without bleeding: Secondary | ICD-10-CM | POA: Diagnosis not present

## 2018-02-24 DIAGNOSIS — Z79899 Other long term (current) drug therapy: Secondary | ICD-10-CM | POA: Diagnosis not present

## 2018-02-24 DIAGNOSIS — Z800 Family history of malignant neoplasm of digestive organs: Secondary | ICD-10-CM | POA: Diagnosis not present

## 2018-02-24 DIAGNOSIS — I10 Essential (primary) hypertension: Secondary | ICD-10-CM | POA: Insufficient documentation

## 2018-02-24 DIAGNOSIS — D123 Benign neoplasm of transverse colon: Secondary | ICD-10-CM | POA: Diagnosis not present

## 2018-02-24 DIAGNOSIS — Z1211 Encounter for screening for malignant neoplasm of colon: Secondary | ICD-10-CM

## 2018-02-24 DIAGNOSIS — I341 Nonrheumatic mitral (valve) prolapse: Secondary | ICD-10-CM | POA: Insufficient documentation

## 2018-02-24 DIAGNOSIS — K640 First degree hemorrhoids: Secondary | ICD-10-CM | POA: Diagnosis not present

## 2018-02-24 DIAGNOSIS — F419 Anxiety disorder, unspecified: Secondary | ICD-10-CM | POA: Diagnosis not present

## 2018-02-24 HISTORY — PX: POLYPECTOMY: SHX5525

## 2018-02-24 HISTORY — PX: COLONOSCOPY WITH PROPOFOL: SHX5780

## 2018-02-24 SURGERY — COLONOSCOPY WITH PROPOFOL
Anesthesia: General | Site: Rectum | Wound class: Contaminated

## 2018-02-24 MED ORDER — IBUPROFEN 100 MG/5ML PO SUSP: 200.0000 mg | Freq: Four times a day (QID) | ORAL | Status: DC | PRN

## 2018-02-24 MED ORDER — FENTANYL CITRATE (PF) 100 MCG/2ML IJ SOLN: 25.0000 ug | INTRAMUSCULAR | Status: DC | PRN

## 2018-02-24 MED ORDER — PROPOFOL 10 MG/ML IV BOLUS
INTRAVENOUS | Status: DC | PRN
  Administered 2018-02-24: 20 mg via INTRAVENOUS
  Administered 2018-02-24 (×2): 40 mg via INTRAVENOUS
  Administered 2018-02-24: 80 mg via INTRAVENOUS
  Administered 2018-02-24 (×3): 40 mg via INTRAVENOUS

## 2018-02-24 MED ORDER — STERILE WATER FOR IRRIGATION IR SOLN
Status: DC | PRN
  Administered 2018-02-24: .5 mL

## 2018-02-24 MED ORDER — LIDOCAINE HCL (CARDIAC) PF 100 MG/5ML IV SOSY
PREFILLED_SYRINGE | INTRAVENOUS | Status: DC | PRN
  Administered 2018-02-24: 20 mg via INTRAVENOUS

## 2018-02-24 MED ORDER — IBUPROFEN 200 MG PO TABS: 200.0000 mg | ORAL_TABLET | Freq: Four times a day (QID) | ORAL | Status: DC | PRN

## 2018-02-24 MED ORDER — LACTATED RINGERS IV SOLN
INTRAVENOUS | Status: DC
  Administered 2018-02-24 (×2): via INTRAVENOUS

## 2018-02-24 SURGICAL SUPPLY — 8 items
CANISTER SUCT 1200ML W/VALVE (MISCELLANEOUS) ×4 IMPLANT
FORCEPS BIOP RAD 4 LRG CAP 4 (CUTTING FORCEPS) ×4 IMPLANT
GOWN CVR UNV OPN BCK APRN NK (MISCELLANEOUS) ×4 IMPLANT
GOWN ISOL THUMB LOOP REG UNIV (MISCELLANEOUS) ×4
KIT ENDO PROCEDURE OLY (KITS) ×4 IMPLANT
SNARE SHORT THROW 13M SML OVAL (MISCELLANEOUS) ×4 IMPLANT
TRAP ETRAP POLY (MISCELLANEOUS) ×4 IMPLANT
WATER STERILE IRR 250ML POUR (IV SOLUTION) ×4 IMPLANT

## 2018-02-24 NOTE — Op Note (Addendum)
[facility] Gastroenterology Patient Name: Mackenzie Bailey Procedure Date: 02/24/2018 8:54 AM MRN: [id number] Account #: [id number] Date of Birth: May 28, 1966 Admit Type: Outpatient Age: 52 Room: [facility] OR ROOM 01 Gender: Female Note Status: Finalized Procedure:            Colonoscopy Indications:          Screening for colorectal malignant neoplasm Providers:            Lucilla Lame MD, MD Referring MD:         Dionne Bucy. Bacigalupo (Referring MD) Medicines:            Propofol per Anesthesia Complications:        No immediate complications. Procedure:            Pre-Anesthesia Assessment:                       - Prior to the procedure, a History and Physical was                        performed, and patient medications and allergies were                        reviewed. The patient's tolerance of previous                        anesthesia was also reviewed. The risks and benefits of                        the procedure and the sedation options and risks were                        discussed with the patient. All questions were                        answered, and informed consent was obtained. Prior                        Anticoagulants: The patient has taken no previous                        anticoagulant or antiplatelet agents. ASA Grade                        Assessment: II - A patient with mild systemic disease.                        After reviewing the risks and benefits, the patient was                        deemed in satisfactory condition to undergo the                        procedure.                       After obtaining informed consent, the colonoscope was                        passed under direct vision. Throughout the procedure,  the patient's blood pressure, pulse, and oxygen                        saturations were monitored continuously. The Olympus CF                        H180AL Colonoscope (S#: [id number]) was introduced  through                        the anus and advanced to the the cecum, identified by                        appendiceal orifice and ileocecal valve. The                        colonoscopy was performed without difficulty. The                        patient tolerated the procedure well. The quality of                        the bowel preparation was excellent. Findings:      The perianal and digital rectal examinations were normal.      Two sessile polyps were found in the ascending colon. The polyps were 2       to 3 mm in size. These polyps were removed with a cold biopsy forceps.       Resection and retrieval were complete.      Two pedunculated polyps were found in the transverse colon. The polyps       were 5 to 6 mm in size. These polyps were removed with a cold snare.       Resection and retrieval were complete.      Multiple small-mouthed diverticula were found in the sigmoid colon.      Non-bleeding internal hemorrhoids were found during retroflexion. The       hemorrhoids were Grade I (internal hemorrhoids that do not prolapse). Impression:           - Two 2 to 3 mm polyps in the ascending colon, removed                        with a cold biopsy forceps. Resected and retrieved.                       - Two 5 to 6 mm polyps in the transverse colon, removed                        with a cold snare. Resected and retrieved.                       - Diverticulosis in the sigmoid colon.                       - Non-bleeding internal hemorrhoids. Recommendation:       - Discharge patient to home.                       - Resume previous diet.                       -  Continue present medications.                       - Await pathology results.                       - Repeat colonoscopy in 5 years if polyp adenoma and 10                        years if hyperplastic Procedure Code(s):    --- Professional ---                       [phone], Colonoscopy, flexible; with removal of tumor(s),                         polyp(s), or other lesion(s) by snare technique                       [id number], 64, Colonoscopy, flexible; with biopsy, single                        or multiple Diagnosis Code(s):    --- Professional ---                       Z12.11, Encounter for screening for malignant neoplasm                        of colon                       D12.2, Benign neoplasm of ascending colon                       D12.3, Benign neoplasm of transverse colon (hepatic                        flexure or splenic flexure) CPT copyright 2017 American Medical Association. All rights reserved. The codes documented in this report are preliminary and upon coder review may  be revised to meet current compliance requirements. Lucilla Lame MD, MD 02/24/2018 9:22:21 AM This report has been signed electronically. Number of Addenda: 0 Note Initiated On: 02/24/2018 8:54 AM Scope Withdrawal Time: 0 hours 14 minutes 12 seconds  Total Procedure Duration: 0 hours 16 minutes 30 seconds       [facility]

## 2018-02-24 NOTE — Anesthesia Procedure Notes (Signed)
Procedure Name: MAC Date/Time: 02/24/2018 8:59 AM Performed by: Lind Guest, CRNA Pre-anesthesia Checklist: Patient identified, Emergency Drugs available, Suction available, Patient being monitored and Timeout performed Patient Re-evaluated:Patient Re-evaluated prior to induction Oxygen Delivery Method: Nasal cannula

## 2018-02-24 NOTE — Transfer of Care (Signed)
Immediate Anesthesia Transfer of Care Note  Patient: Mackenzie Bailey  Procedure(s) Performed: COLONOSCOPY WITH PROPOFOL (N/A Rectum) POLYPECTOMY (Rectum)  Patient Location: PACU  Anesthesia Type: General  Level of Consciousness: awake, alert  and patient cooperative  Airway and Oxygen Therapy: Patient Spontanous Breathing and Patient connected to supplemental oxygen  Post-op Assessment: Post-op Vital signs reviewed, Patient's Cardiovascular Status Stable, Respiratory Function Stable, Patent Airway and No signs of Nausea or vomiting  Post-op Vital Signs: Reviewed and stable  Complications: No apparent anesthesia complications

## 2018-02-24 NOTE — Anesthesia Postprocedure Evaluation (Signed)
Anesthesia Post Note  Patient: Mackenzie Bailey  Procedure(s) Performed: COLONOSCOPY WITH PROPOFOL (N/A Rectum) POLYPECTOMY (Rectum)  Patient location during evaluation: PACU Anesthesia Type: General Level of consciousness: awake and alert Pain management: pain level controlled Vital Signs Assessment: post-procedure vital signs reviewed and stable Respiratory status: spontaneous breathing Cardiovascular status: blood pressure returned to baseline Postop Assessment: no headache Anesthetic complications: no    Jaci Standard, III,  Maeghan Canny D

## 2018-02-24 NOTE — H&P (Signed)
Lucilla Lame, MD [address] [place], [place] [address] Phone: [phone] Fax : [phone]  Primary Care Physician:  Virginia Crews, MD Primary Gastroenterologist:  Dr. Allen Norris  Pre-Procedure History & Physical: HPI:  Mackenzie Bailey is a 52 y.o. female is here for a screening colonoscopy.   Past Medical History:  Diagnosis Date  . Allergy   . Anxiety   . Asthma   . Female cystocele   . Heart murmur   . Hypertension   . Menorrhagia   . Mitral valve prolapse   . SUI (stress urinary incontinence, female)   . Vaginal pessary present     Past Surgical History:  Procedure Laterality Date  . BREAST BIOPSY Right 2012?   benign  . BREAST SURGERY     benign biobsy  . FRACTURE SURGERY     R lower leg  . POLYPECTOMY     uterine, found on sonohystogram, contributed to menorrhagia     Prior to Admission medications   Medication Sig Start Date End Date Taking? Authorizing Provider  albuterol (PROVENTIL) (2.5 MG/3ML) 0.083% nebulizer solution Take 3 mLs (2.5 mg total) by nebulization every 6 (six) hours as needed for wheezing or shortness of breath. 08/02/17   Roselee Nova, MD  ALPRAZolam Duanne Moron) 1 MG tablet Take 1 tablet (1 mg total) by mouth daily as needed for anxiety. 08/02/17   Roselee Nova, MD  amLODipine (NORVASC) 5 MG tablet TAKE 1 TABLET BY MOUTH EVERY DAY 10/31/17   Roselee Nova, MD  citalopram (CELEXA) 20 MG tablet Take 1 tablet (20 mg total) by mouth daily. 02/13/18   Virginia Crews, MD  levonorgestrel (MIRENA) 20 MCG/24HR IUD 1 each by Intrauterine route once. 10/18/14   [provider]  montelukast (SINGULAIR) 10 MG tablet TAKE 1 TABLET BY MOUTH EVERYDAY AT BEDTIME 01/25/18   Bacigalupo, Dionne Bucy, MD  Respiratory Therapy Supplies (NEBULIZER) DEVI 1 Device by Does not apply route as directed. 08/02/17   Roselee Nova, MD    Allergies as of 02/01/2018 - Review Complete 01/31/2018  Allergen Reaction Noted  .  Penicillins Hives 09/25/2015    Family History  Problem Relation Age of Onset  . Arthritis Father   . Asthma Father   . Skin cancer Father        melanoma  . Thyroid disease Sister   . Uterine cancer Sister   . Heart attack Maternal Grandfather   . Early death Maternal Grandfather        massive heart attack  . Arthritis Paternal Grandmother   . Cancer Paternal Grandmother        uterine  . Stroke Paternal Grandfather   . Hypertension Mother   . Colon cancer Neg Hx   . Breast cancer Neg Hx     Social History   Socioeconomic History  . Marital status: Married    Spouse name: Phu  . Number of children: 1  . Years of education: 82  . Highest education level: Some college, no degree  Occupational History    Employer: [employer]  Social Needs  . Financial resource strain: Not hard at all  . Food insecurity:    Worry: Never true    Inability: Never true  . Transportation needs:    Medical: No    Non-medical: No  Tobacco Use  . Smoking status: Never Smoker  . Smokeless tobacco: Never Used  Substance and Sexual Activity  .  Alcohol use: Yes    Alcohol/week: 0.6 - 1.2 oz    Types: 1 - 2 Glasses of wine per week  . Drug use: No  . Sexual activity: Yes    Partners: Male    Birth control/protection: IUD  Lifestyle  . Physical activity:    Days per week: 0 days    Minutes per session: Not on file  . Stress: To some extent  Relationships  . Social connections:    Talks on phone: More than three times a week    Gets together: Once a week    Attends religious service: 1 to 4 times per year    Active member of club or organization: No    Attends meetings of clubs or organizations: Never    Relationship status: Married  . Intimate partner violence:    Fear of current or ex partner: No    Emotionally abused: No    Physically abused: No    Forced sexual activity: No  Other Topics Concern  . Not on file  Social History Narrative  . Not on file    Review of  Systems: See HPI, otherwise negative ROS  Physical Exam: Ht 5\' 4"  (1.626 m)   Wt 200 lb (90.7 kg)   BMI 34.33 kg/m  General:   Alert,  pleasant and cooperative in NAD Head:  Normocephalic and atraumatic. Neck:  Supple; no masses or thyromegaly. Lungs:  Clear throughout to auscultation.    Heart:  Regular rate and rhythm. Abdomen:  Soft, nontender and nondistended. Normal bowel sounds, without guarding, and without rebound.   Neurologic:  Alert and  oriented x4;  grossly normal neurologically.  Impression/Plan: Vanda Waskey is now here to undergo a screening colonoscopy.  Risks, benefits, and alternatives regarding colonoscopy have been reviewed with the patient.  Questions have been answered.  All parties agreeable.

## 2018-02-24 NOTE — Anesthesia Preprocedure Evaluation (Signed)
Anesthesia Evaluation  Patient identified by MRN, date of birth, ID band Patient awake    Reviewed: Allergy & Precautions, H&P , NPO status , Patient's Chart, lab work & pertinent test results  Airway Mallampati: II  TM Distance: >3 FB Neck ROM: full    Dental no notable dental hx.    Pulmonary asthma ,    Pulmonary exam normal        Cardiovascular hypertension, On Medications Normal cardiovascular exam     Neuro/Psych    GI/Hepatic negative GI ROS, Neg liver ROS,   Endo/Other  negative endocrine ROS  Renal/GU negative Renal ROS     Musculoskeletal   Abdominal   Peds  Hematology negative hematology ROS (+)   Anesthesia Other Findings   Reproductive/Obstetrics negative OB ROS                            Anesthesia Physical Anesthesia Plan  ASA: II  Anesthesia Plan: General   Post-op Pain Management:    Induction:   PONV Risk Score and Plan:   Airway Management Planned:   Additional Equipment:   Intra-op Plan:   Post-operative Plan:   Informed Consent: I have reviewed the patients History and Physical, chart, labs and discussed the procedure including the risks, benefits and alternatives for the proposed anesthesia with the patient or authorized representative who has indicated his/her understanding and acceptance.     Plan Discussed with:   Anesthesia Plan Comments:         Anesthesia Quick Evaluation

## 2018-02-25 ENCOUNTER — Other Ambulatory Visit: Payer: Self-pay | Admitting: Family Medicine

## 2018-02-25 DIAGNOSIS — I10 Essential (primary) hypertension: Secondary | ICD-10-CM

## 2018-02-27 ENCOUNTER — Encounter: Payer: Self-pay | Admitting: Gastroenterology

## 2018-03-27 ENCOUNTER — Ambulatory Visit: Payer: Commercial Managed Care - PPO | Admitting: Family Medicine

## 2018-03-27 ENCOUNTER — Encounter: Payer: Self-pay | Admitting: Family Medicine

## 2018-03-27 VITALS — BP 130/82 | HR 72 | Temp 97.6°F | Resp 16 | Wt 205.0 lb

## 2018-03-27 DIAGNOSIS — E669 Obesity, unspecified: Secondary | ICD-10-CM

## 2018-03-27 DIAGNOSIS — Z6835 Body mass index (BMI) 35.0-35.9, adult: Secondary | ICD-10-CM

## 2018-03-27 DIAGNOSIS — J302 Other seasonal allergic rhinitis: Secondary | ICD-10-CM

## 2018-03-27 DIAGNOSIS — F419 Anxiety disorder, unspecified: Secondary | ICD-10-CM

## 2018-03-27 DIAGNOSIS — I10 Essential (primary) hypertension: Secondary | ICD-10-CM | POA: Diagnosis not present

## 2018-03-27 DIAGNOSIS — F5081 Binge eating disorder: Secondary | ICD-10-CM | POA: Insufficient documentation

## 2018-03-27 MED ORDER — TOPIRAMATE 50 MG PO TABS: ORAL_TABLET | ORAL | 0 refills | Status: DC

## 2018-03-27 MED ORDER — CITALOPRAM HYDROBROMIDE 20 MG PO TABS: 20.0000 mg | ORAL_TABLET | Freq: Every day | ORAL | 3 refills | Status: DC

## 2018-03-27 MED ORDER — AMLODIPINE BESYLATE 5 MG PO TABS: 5.0000 mg | ORAL_TABLET | Freq: Every day | ORAL | 3 refills | Status: DC

## 2018-03-27 MED ORDER — ALPRAZOLAM 1 MG PO TABS: 1.0000 mg | ORAL_TABLET | Freq: Every day | ORAL | 0 refills | Status: DC | PRN

## 2018-03-27 MED ORDER — MONTELUKAST SODIUM 10 MG PO TABS: 10.0000 mg | ORAL_TABLET | Freq: Every day | ORAL | 3 refills | Status: DC

## 2018-03-27 NOTE — Assessment & Plan Note (Signed)
Contributes to obesity Discussed options for weight loss medications and decided that Topamax alone without phentermine is the best option for the patient Start with 25 mg nightly and increase by 25 mg weekly Max dose 100 mg nightly prior to follow-up She will call back in 4 weeks and let us know what dose she is landed at for a refill Follow-up at her CPE

## 2018-03-27 NOTE — Progress Notes (Signed)
Patient: Mackenzie Bailey Female    DOB: 09-03-1966   52 y.o.   MRN: [id number] Visit Date: 03/27/2018  Today's Provider: Lavon Paganini, MD   I, Martha Clan, CMA, am acting as scribe for Lavon Paganini, MD.  Chief Complaint  Patient presents with  . Anxiety   Subjective:    Anxiety  Presents for follow-up (LOV 01/02/2018. Pt was started on Celexa 20 mg at [place].) visit. Symptoms include insomnia (intermittent). Patient reports no chest pain, compulsions, confusion, decreased concentration, depressed mood, dizziness, dry mouth, excessive worry, feeling of choking, hyperventilation, irritability, malaise, muscle tension, nausea, nervous/anxious behavior, obsessions, palpitations, panic, restlessness, shortness of breath or suicidal ideas. The severity of symptoms is mild. The patient sleeps 7 hours per night. The quality of sleep is good.   Compliance with medications: good compliance. Treatment side effects: no side effects.  Pt states she is 75% improved.  Patient does have some Xanax left from many years ago.  She states she likes having this and knowing that if there if she needs it.  She has not taken it in many months.  She states that on average she takes it less than once every 2 months.  Needs refill on her amlodipine and Singulair.  Patient is concerned about her weight.  She thinks she is gained weight since she was here last time.  Chart review reveals that she was 209 pounds in 01/2018, 201 in 02/2018 and 205 today.  She states that she is tried healthier and cutting back on her portion size.  She is walking daily.  She did just by elliptical which she is trying to use more.  She finds that she can be good about her diet and exercise, but then when she gets stressed she will binge eat excessive calories.     Allergies  Allergen Reactions  . Codeine Nausea And Vomiting  . Penicillins Hives     Current Outpatient Medications:  .  ALPRAZolam (XANAX) 1 MG  tablet, Take 1 tablet (1 mg total) by mouth daily as needed for anxiety., Disp: 30 tablet, Rfl: 0 .  amLODipine (NORVASC) 5 MG tablet, TAKE 1 TABLET BY MOUTH EVERY DAY, Disp: 90 tablet, Rfl: 3 .  citalopram (CELEXA) 20 MG tablet, Take 1 tablet (20 mg total) by mouth daily., Disp: 90 tablet, Rfl: 1 .  levonorgestrel (MIRENA) 20 MCG/24HR IUD, 1 each by Intrauterine route once., Disp: , Rfl:  .  montelukast (SINGULAIR) 10 MG tablet, TAKE 1 TABLET BY MOUTH EVERYDAY AT BEDTIME, Disp: 90 tablet, Rfl: 1 .  albuterol (PROVENTIL) (2.5 MG/3ML) 0.083% nebulizer solution, Take 3 mLs (2.5 mg total) by nebulization every 6 (six) hours as needed for wheezing or shortness of breath. (Patient not taking: Reported on 03/27/2018), Disp: 75 mL, Rfl: 12 .  Respiratory Therapy Supplies (NEBULIZER) DEVI, 1 Device by Does not apply route as directed. (Patient not taking: Reported on 03/27/2018), Disp: 1 each, Rfl: 0  Review of Systems  Constitutional: Negative.  Negative for irritability.  HENT: Negative.   Respiratory: Negative.  Negative for shortness of breath.   Cardiovascular: Negative.  Negative for chest pain and palpitations.  Gastrointestinal: Negative.  Negative for nausea.  Genitourinary: Negative.   Musculoskeletal: Negative.   Skin: Negative.   Neurological: Negative.  Negative for dizziness.  Psychiatric/Behavioral: Negative for confusion, decreased concentration and suicidal ideas. The patient has insomnia (intermittent). The patient is not nervous/anxious.     Social History   Tobacco Use  .  Smoking status: Never Smoker  . Smokeless tobacco: Never Used  Substance Use Topics  . Alcohol use: Yes    Alcohol/week: 0.6 - 1.2 oz    Types: 1 - 2 Glasses of wine per week   Objective:   BP 130/82 (BP Location: Left Arm, Patient Position: Sitting, Cuff Size: Large)   Pulse 72   Temp 97.6 F (36.4 C) (Oral)   Resp 16   Wt 205 lb (93 kg)   SpO2 97%   BMI 35.19 kg/m  Vitals:   03/27/18 0841  BP:  130/82  Pulse: 72  Resp: 16  Temp: 97.6 F (36.4 C)  TempSrc: Oral  SpO2: 97%  Weight: 205 lb (93 kg)     Physical Exam  Constitutional: She is oriented to person, place, and time. She appears well-developed and well-nourished. No distress.  HENT:  Head: Normocephalic and atraumatic.  Eyes: Conjunctivae are normal. Right eye exhibits no discharge. Left eye exhibits no discharge. No scleral icterus.  Neck: Neck supple. No thyromegaly present.  Cardiovascular: Normal rate, regular rhythm and intact distal pulses.  Murmur heard. Pulmonary/Chest: Effort normal and breath sounds normal. No respiratory distress. She has no wheezes. She has no rales.  Abdominal: Soft. She exhibits no distension. There is no tenderness.  Musculoskeletal: She exhibits no edema.  Lymphadenopathy:    She has no cervical adenopathy.  Neurological: She is alert and oriented to person, place, and time.  Skin: Skin is warm and dry. Capillary refill takes less than 2 seconds. No rash noted.  Psychiatric: She has a normal mood and affect. Her behavior is normal.  Vitals reviewed.   Depression screen [facility] 2/9 03/27/2018 01/02/2018 08/02/2017 06/06/2017 10/30/2015  Decreased Interest 0 0 0 0 0  Down, Depressed, Hopeless 0 0 0 0 0  PHQ - 2 Score 0 0 0 0 0  Altered sleeping 1 - - - -  Tired, decreased energy 1 - - - -  Change in appetite 1 - - - -  Feeling bad or failure about yourself  0 - - - -  Trouble concentrating 0 - - - -  Moving slowly or fidgety/restless 0 - - - -  Suicidal thoughts 0 - - - -  PHQ-9 Score 3 - - - -  Difficult doing work/chores Not difficult at all - - - -     GAD 7 : Generalized Anxiety Score 03/27/2018 01/02/2018  Nervous, Anxious, on Edge 0 1  Control/stop worrying 0 1  Worry too much - different things 0 1  Trouble relaxing 1 1  Restless 0 0  Easily annoyed or irritable 0 1  Afraid - awful might happen 1 2  Total GAD 7 Score 2 7  Anxiety Difficulty Not difficult at all Not  difficult at all        Assessment & Plan:   Problem List Items Addressed This Visit      Cardiovascular and Mediastinum   Essential hypertension    Refilled amlodipine Did not obtain screening labs after last visit, so she will get these today      Relevant Medications   amLODipine (NORVASC) 5 MG tablet     Other   Anxiety    Much improved Continue Celexa 20 mg daily Okay to have Xanax on hand to use very infrequently as she is Follow-up in 6 months      Relevant Medications   citalopram (CELEXA) 20 MG tablet   ALPRAZolam (XANAX) 1 MG tablet  Class 2 obesity without serious comorbidity with body mass index (BMI) of 35.0 to 35.9 in adult - Primary    Discussed healthy weight management, diet, and exercise Discussed mindful eating, which I hope with better control of her anxiety will be improved Seems to have trouble with overeating and binge eating, so we will try Topamax as below Discussed that even with Topamax, it is important to cut back on calories and carbohydrates and exercise more frequently      Binge eating disorder    Contributes to obesity Discussed options for weight loss medications and decided that Topamax alone without phentermine is the best option for the patient Start with 25 mg nightly and increase by 25 mg weekly Max dose 100 mg nightly prior to follow-up She will call back in 4 weeks and let us know what dose she is landed at for a refill Follow-up at her CPE       Other Visit Diagnoses    Other seasonal allergic rhinitis       Relevant Medications   montelukast (SINGULAIR) 10 MG tablet       Return in about 2 months (around 05/27/2018) for CPE.   The entirety of the information documented in the History of Present Illness, Review of Systems and Physical Exam were personally obtained by me. Portions of this information were initially documented by Raquel Sarna Ratchford, CMA and reviewed by me for thoroughness and accuracy.    Virginia Crews, MD, MPH [facility] 03/27/2018 9:43 AM

## 2018-03-27 NOTE — Assessment & Plan Note (Signed)
Discussed healthy weight management, diet, and exercise Discussed mindful eating, which I hope with better control of her anxiety will be improved Seems to have trouble with overeating and binge eating, so we will try Topamax as below Discussed that even with Topamax, it is important to cut back on calories and carbohydrates and exercise more frequently

## 2018-03-27 NOTE — Assessment & Plan Note (Addendum)
Refilled amlodipine Did not obtain screening labs after last visit, so she will get these today

## 2018-03-27 NOTE — Assessment & Plan Note (Signed)
Much improved Continue Celexa 20 mg daily Okay to have Xanax on hand to use very infrequently as she is Follow-up in 6 months

## 2018-03-27 NOTE — Patient Instructions (Signed)
Diverticulosis  Diverticulosis is a condition that develops when small pouches (diverticula) form in the wall of the large intestine (colon). The colon is where water is absorbed and stool is formed. The pouches form when the inside layer of the colon pushes through weak spots in the outer layers of the colon. You may have a few pouches or many of them.  What are the causes?  The cause of this condition is not known.  What increases the risk?  The following factors may make you more likely to develop this condition:   Being older than age 60. Your risk for this condition increases with age. Diverticulosis is rare among people younger than age 30. By age 80, many people have it.   Eating a low-fiber diet.   Having frequent constipation.   Being overweight.   Not getting enough exercise.   Smoking.   Taking over-the-counter pain medicines, like aspirin and ibuprofen.   Having a family history of diverticulosis.    What are the signs or symptoms?  In most people, there are no symptoms of this condition. If you do have symptoms, they may include:   Bloating.   Cramps in the abdomen.   Constipation or diarrhea.   Pain in the lower left side of the abdomen.    How is this diagnosed?  This condition is most often diagnosed during an exam for other colon problems. Because diverticulosis usually has no symptoms, it often cannot be diagnosed independently. This condition may be diagnosed by:   Using a flexible scope to examine the colon (colonoscopy).   Taking an X-ray of the colon after dye has been put into the colon (barium enema).   Doing a CT scan.    How is this treated?  You may not need treatment for this condition if you have never developed an infection related to diverticulosis. If you have had an infection before, treatment may include:   Eating a high-fiber diet. This may include eating more fruits, vegetables, and grains.   Taking a fiber supplement.   Taking a live bacteria supplement  (probiotic).   Taking medicine to relax your colon.   Taking antibiotic medicines.    Follow these instructions at home:   Drink 6-8 glasses of water or more each day to prevent constipation.   Try not to strain when you have a bowel movement.   If you have had an infection before:  ? Eat more fiber as directed by your health care provider or your diet and nutrition specialist (dietitian).  ? Take a fiber supplement or probiotic, if your health care provider approves.   Take over-the-counter and prescription medicines only as told by your health care provider.   If you were prescribed an antibiotic, take it as told by your health care provider. Do not stop taking the antibiotic even if you start to feel better.   Keep all follow-up visits as told by your health care provider. This is important.  Contact a health care provider if:   You have pain in your abdomen.   You have bloating.   You have cramps.   You have not had a bowel movement in 3 days.  Get help right away if:   Your pain gets worse.   Your bloating becomes very bad.   You have a fever or chills, and your symptoms suddenly get worse.   You vomit.   You have bowel movements that are bloody or black.   You have   bleeding from your rectum.  Summary   Diverticulosis is a condition that develops when small pouches (diverticula) form in the wall of the large intestine (colon).   You may have a few pouches or many of them.   This condition is most often diagnosed during an exam for other colon problems.   If you have had an infection related to diverticulosis, treatment may include increasing the fiber in your diet, taking supplements, or taking medicines.  This information is not intended to replace advice given to you by your health care provider. Make sure you discuss any questions you have with your health care provider.  Document Released: 07/01/2004 Document Revised: 08/23/2016 Document Reviewed: 08/23/2016  Elsevier Interactive  Patient Education  2017 [employer]

## 2018-03-28 ENCOUNTER — Telehealth: Payer: Self-pay

## 2018-03-28 LAB — LIPID PANEL
CHOLESTEROL TOTAL: 162 mg/dL (ref 100–199)
Chol/HDL Ratio: 3.1 ratio (ref 0.0–4.4)
HDL: 53 mg/dL (ref >39)
LDL CALC: 98 mg/dL (ref 0–99)
TRIGLYCERIDES: 53 mg/dL (ref 0–149)
VLDL CHOLESTEROL CAL: 11 mg/dL (ref 5–40)

## 2018-03-28 LAB — COMPREHENSIVE METABOLIC PANEL
A/G RATIO: 1.6 (ref 1.2–2.2)
ALBUMIN: 4 g/dL (ref 3.5–5.5)
ALK PHOS: 94 IU/L (ref 39–117)
ALT: 13 IU/L (ref 0–32)
AST: 14 IU/L (ref 0–40)
BUN / CREAT RATIO: 11 (ref 9–23)
BUN: 7 mg/dL (ref 6–24)
Bilirubin Total: 0.3 mg/dL (ref 0.0–1.2)
CALCIUM: 9.2 mg/dL (ref 8.7–10.2)
CO2: 24 mmol/L (ref 20–29)
Chloride: 102 mmol/L (ref 96–106)
Creatinine, Ser: 0.65 mg/dL (ref 0.57–1.00)
GFR calc Af Amer: 120 mL/min/{1.73_m2} (ref >59)
GFR, EST NON AFRICAN AMERICAN: 104 mL/min/{1.73_m2} (ref >59)
GLOBULIN, TOTAL: 2.5 g/dL (ref 1.5–4.5)
Glucose: 88 mg/dL (ref 65–99)
POTASSIUM: 3.8 mmol/L (ref 3.5–5.2)
SODIUM: 139 mmol/L (ref 134–144)
Total Protein: 6.5 g/dL (ref 6.0–8.5)

## 2018-03-28 LAB — CBC
Hematocrit: 38.6 % (ref 34.0–46.6)
Hemoglobin: 12.7 g/dL (ref 11.1–15.9)
MCH: 25.3 pg — ABNORMAL LOW (ref 26.6–33.0)
MCHC: 32.9 g/dL (ref 31.5–35.7)
MCV: 77 fL — ABNORMAL LOW (ref 79–97)
PLATELETS: 315 10*3/uL (ref 150–450)
RBC: 5.02 x10E6/uL (ref 3.77–5.28)
RDW: 15.5 % — AB (ref 12.3–15.4)
WBC: 6.3 10*3/uL (ref 3.4–10.8)

## 2018-03-28 LAB — TSH: TSH: 1.54 u[IU]/mL (ref 0.450–4.500)

## 2018-03-28 NOTE — Telephone Encounter (Signed)
LMTCB. Also advised pt these are available to view on My Chart.

## 2018-03-28 NOTE — Telephone Encounter (Signed)
-----   Message from Virginia Crews, MD sent at 03/28/2018  8:56 AM EDT ----- Normal Blood counts, kidney function, liver function, electrolytes, Thyroid function, cholesterol.  Virginia Crews, MD, MPH [facility] 03/28/2018 8:56 AM

## 2018-03-28 NOTE — Telephone Encounter (Signed)
Pt advised.

## 2018-04-17 ENCOUNTER — Other Ambulatory Visit: Payer: Self-pay | Admitting: Family Medicine

## 2018-04-18 NOTE — Telephone Encounter (Signed)
For Dr. Jacinto Reap   Thanks,   -Mickel Baas

## 2018-04-19 ENCOUNTER — Telehealth: Payer: Self-pay

## 2018-04-19 DIAGNOSIS — F5081 Binge eating disorder: Secondary | ICD-10-CM

## 2018-04-19 NOTE — Telephone Encounter (Signed)
[facility] send a request to fax new RX for Topamax. RX sent on 04/18/18. No directions were attached.

## 2018-04-21 MED ORDER — TOPIRAMATE 100 MG PO TABS: 100.0000 mg | ORAL_TABLET | Freq: Every day | ORAL | 0 refills | Status: DC

## 2018-04-21 NOTE — Telephone Encounter (Signed)
Resent topamax.

## 2018-04-22 ENCOUNTER — Other Ambulatory Visit: Payer: Self-pay | Admitting: Family Medicine

## 2018-05-11 ENCOUNTER — Encounter: Payer: Self-pay | Admitting: Family Medicine

## 2018-05-11 ENCOUNTER — Ambulatory Visit: Payer: Commercial Managed Care - PPO | Admitting: Family Medicine

## 2018-05-11 VITALS — BP 116/86 | HR 77 | Temp 97.8°F | Resp 16 | Wt 207.0 lb

## 2018-05-11 DIAGNOSIS — T63421A Toxic effect of venom of ants, accidental (unintentional), initial encounter: Secondary | ICD-10-CM

## 2018-05-11 MED ORDER — HYDROXYZINE HCL 10 MG PO TABS: 10.0000 mg | ORAL_TABLET | Freq: Three times a day (TID) | ORAL | 0 refills | Status: DC | PRN

## 2018-05-11 MED ORDER — TRIAMCINOLONE ACETONIDE 0.5 % EX OINT: 1.0000 "application " | TOPICAL_OINTMENT | Freq: Two times a day (BID) | CUTANEOUS | 0 refills | Status: DC

## 2018-05-11 NOTE — Patient Instructions (Addendum)
Start daily Zyrtec (cetirizine) Start triamcinolone ointment (steroid) twice daily Use hydroxyzine prn for itching (can make you sleepy)  This might look worse before they look better If you get any honey crusting, try applying antibiotic ointment (Neosporin) twice daily

## 2018-05-11 NOTE — Progress Notes (Signed)
Patient: Mackenzie Bailey Female    DOB: 08/11/1966   52 y.o.   MRN: [id number] Visit Date: 05/11/2018  Today's Provider: Lavon Paganini, MD   I, Martha Clan, CMA, am acting as scribe for Lavon Paganini, MD.  Chief Complaint  Patient presents with  . Rash   Subjective:    Rash  This is a new problem. Episode onset: x 5 days. The problem has been gradually worsening since onset. The affected locations include the left ankle and right fingers. The rash is characterized by redness, itchiness and burning. She was exposed to an insect bite/sting (possible any bites). Associated symptoms include congestion. Pertinent negatives include no anorexia, cough, diarrhea, eye pain, facial edema, fatigue, fever, joint pain, nail changes, rhinorrhea, shortness of breath, sore throat or vomiting. Treatments tried: different creams. The treatment provided no relief.  She states lavender essential oils improve the itch.     Allergies  Allergen Reactions  . Codeine Nausea And Vomiting  . Penicillins Hives     Current Outpatient Medications:  .  ALPRAZolam (XANAX) 1 MG tablet, Take 1 tablet (1 mg total) by mouth daily as needed for anxiety., Disp: 30 tablet, Rfl: 0 .  amLODipine (NORVASC) 5 MG tablet, Take 1 tablet (5 mg total) by mouth daily., Disp: 90 tablet, Rfl: 3 .  citalopram (CELEXA) 20 MG tablet, Take 1 tablet (20 mg total) by mouth daily., Disp: 90 tablet, Rfl: 3 .  levonorgestrel (MIRENA) 20 MCG/24HR IUD, 1 each by Intrauterine route once., Disp: , Rfl:  .  montelukast (SINGULAIR) 10 MG tablet, Take 1 tablet (10 mg total) by mouth at bedtime., Disp: 90 tablet, Rfl: 3 .  albuterol (PROVENTIL) (2.5 MG/3ML) 0.083% nebulizer solution, Take 3 mLs (2.5 mg total) by nebulization every 6 (six) hours as needed for wheezing or shortness of breath. (Patient not taking: Reported on 03/27/2018), Disp: 75 mL, Rfl: 12 .  Respiratory Therapy Supplies (NEBULIZER) DEVI, 1 Device by Does  not apply route as directed. (Patient not taking: Reported on 03/27/2018), Disp: 1 each, Rfl: 0 .  topiramate (TOPAMAX) 100 MG tablet, Take 1 tablet (100 mg total) by mouth at bedtime. (Patient not taking: Reported on 05/11/2018), Disp: 30 tablet, Rfl: 0  Review of Systems  Constitutional: Negative for fatigue and fever.  HENT: Positive for congestion. Negative for rhinorrhea and sore throat.   Eyes: Negative for pain.  Respiratory: Negative for cough and shortness of breath.   Gastrointestinal: Negative for anorexia, diarrhea and vomiting.  Musculoskeletal: Negative for joint pain.  Skin: Positive for rash. Negative for nail changes.    Social History   Tobacco Use  . Smoking status: Never Smoker  . Smokeless tobacco: Never Used  Substance Use Topics  . Alcohol use: Yes    Alcohol/week: 0.6 - 1.2 oz    Types: 1 - 2 Glasses of wine per week   Objective:   BP 116/86 (BP Location: Left Arm, Patient Position: Sitting, Cuff Size: Large)   Pulse 77   Resp 16   Wt 207 lb (93.9 kg)   SpO2 99%   BMI 35.53 kg/m  Vitals:   05/11/18 0854  BP: 116/86  Pulse: 77  Resp: 16  SpO2: 99%  Weight: 207 lb (93.9 kg)     Physical Exam  Constitutional: She is oriented to person, place, and time. She appears well-developed and well-nourished. No distress.  HENT:  Head: Normocephalic and atraumatic.  Eyes: Conjunctivae are normal. No scleral icterus.  Cardiovascular: Normal rate and regular rhythm.  Pulmonary/Chest: Effort normal. No respiratory distress.  Musculoskeletal: She exhibits no edema.  Neurological: She is alert and oriented to person, place, and time.  Skin: Skin is warm and dry. Capillary refill takes less than 2 seconds. Rash (see pictures) noted.  Psychiatric: She has a normal mood and affect. Her behavior is normal.  Vitals reviewed.            Assessment & Plan:   1. Fire ant bite, accidental or unintentional, initial encounter -Bite marks are consistent with  fire ant bites -No evidence of infection next line-discussed with patient that these can often get worse before they get better -Triamcinolone ointment twice daily for itching and inflammation -Advised daily Zyrtec to help with histamine reaction - Hydroxyzine as needed for itching -Discussed icing and other symptomatic management -Discussed signs of infection and return precautions    Meds ordered this encounter  Medications  . triamcinolone ointment (KENALOG) 0.5 %    Sig: Apply 1 application topically 2 (two) times daily.    Dispense:  60 g    Refill:  0  . hydrOXYzine (ATARAX/VISTARIL) 10 MG tablet    Sig: Take 1 tablet (10 mg total) by mouth 3 (three) times daily as needed.    Dispense:  60 tablet    Refill:  0     Return if symptoms worsen or fail to improve.   The entirety of the information documented in the History of Present Illness, Review of Systems and Physical Exam were personally obtained by me. Portions of this information were initially documented by Raquel Sarna Ratchford, CMA and reviewed by me for thoroughness and accuracy.    Virginia Crews, MD, MPH [facility] 05/11/2018 11:16 AM

## 2018-05-17 ENCOUNTER — Other Ambulatory Visit: Payer: Self-pay | Admitting: Physician Assistant

## 2018-05-17 DIAGNOSIS — F5081 Binge eating disorder: Secondary | ICD-10-CM

## 2018-06-09 ENCOUNTER — Ambulatory Visit (INDEPENDENT_AMBULATORY_CARE_PROVIDER_SITE_OTHER): Payer: Commercial Managed Care - PPO | Admitting: Family Medicine

## 2018-06-09 ENCOUNTER — Encounter: Payer: Self-pay | Admitting: Family Medicine

## 2018-06-09 VITALS — BP 122/82 | HR 78 | Temp 97.9°F | Resp 16 | Ht 64.0 in | Wt 208.0 lb

## 2018-06-09 DIAGNOSIS — E669 Obesity, unspecified: Secondary | ICD-10-CM

## 2018-06-09 DIAGNOSIS — Z6835 Body mass index (BMI) 35.0-35.9, adult: Secondary | ICD-10-CM | POA: Diagnosis not present

## 2018-06-09 DIAGNOSIS — Z0000 Encounter for general adult medical examination without abnormal findings: Secondary | ICD-10-CM

## 2018-06-09 MED ORDER — PHENTERMINE HCL 37.5 MG PO CAPS: 37.5000 mg | ORAL_CAPSULE | ORAL | 2 refills | Status: DC

## 2018-06-09 NOTE — Progress Notes (Signed)
Patient: Mackenzie Bailey, Female    DOB: 1965-12-19, 52 y.o.   MRN: [id number] Visit Date: 06/09/2018  Today's Provider: Lavon Paganini, MD   I, Martha Clan, CMA, am acting as scribe for Lavon Paganini, MD.  Chief Complaint  Patient presents with  . Annual Exam   Subjective:    Annual physical exam Mackenzie Bailey is a 52 y.o. female who presents today for health maintenance and complete physical. She feels well. She is frustrated with her weight. She was prescribed Topamax, which caused drowsiness the next day. She reports exercising 3-4 times per week for about 25 minutes per week on her elliptical.  She knows that she overeats, but she finds it difficult to control. She reports she is sleeping fairly well.  last mammogram- 01/02/2018- BI-RADS 1 through [facility] Last colonoscopy- 02/24/2018. Tubular adenomas. Repeat 5 years. -----------------------------------------------------------------   Review of Systems  Constitutional: Positive for fatigue. Negative for activity change, appetite change, chills, diaphoresis, fever and unexpected weight change.  HENT: Negative.   Eyes: Negative.   Respiratory: Negative.   Cardiovascular: Negative.   Gastrointestinal: Negative.   Endocrine: Negative.   Genitourinary: Positive for frequency. Negative for decreased urine volume, difficulty urinating, dyspareunia, dysuria, enuresis, flank pain, genital sores, hematuria, menstrual problem, pelvic pain, urgency, vaginal bleeding, vaginal discharge and vaginal pain.  Musculoskeletal: Negative.   Skin: Negative.   Allergic/Immunologic: Negative.   Neurological: Negative.   Hematological: Negative.   Psychiatric/Behavioral: Negative.     Social History      She  reports that she has never smoked. She has never used smokeless tobacco. She reports that she drinks about 1.0 - 2.0 standard drinks of alcohol per week. She reports that she does not use drugs.       Social  History   Socioeconomic History  . Marital status: Married    Spouse name: Phu  . Number of children: 1  . Years of education: 13  . Highest education level: Some college, no degree  Occupational History    Employer: [employer]  Social Needs  . Financial resource strain: Not hard at all  . Food insecurity:    Worry: Never true    Inability: Never true  . Transportation needs:    Medical: No    Non-medical: No  Tobacco Use  . Smoking status: Never Smoker  . Smokeless tobacco: Never Used  Substance and Sexual Activity  . Alcohol use: Yes    Alcohol/week: 1.0 - 2.0 standard drinks    Types: 1 - 2 Glasses of wine per week  . Drug use: No  . Sexual activity: Yes    Partners: Male    Birth control/protection: IUD  Lifestyle  . Physical activity:    Days per week: 0 days    Minutes per session: Not on file  . Stress: To some extent  Relationships  . Social connections:    Talks on phone: More than three times a week    Gets together: Once a week    Attends religious service: 1 to 4 times per year    Active member of club or organization: No    Attends meetings of clubs or organizations: Never    Relationship status: Married  Other Topics Concern  . Not on file  Social History Narrative  . Not on file    Past Medical History:  Diagnosis Date  . Allergy   . Anxiety   . Asthma   .  Female cystocele   . Heart murmur   . Hypertension   . Menorrhagia   . Mitral valve prolapse   . SUI (stress urinary incontinence, female)   . Vaginal pessary present      Patient Active Problem List   Diagnosis Date Noted  . Binge eating disorder 03/27/2018  . Benign neoplasm of ascending colon   . Benign neoplasm of transverse colon   . Family history of melanoma 01/02/2018  . Class 2 obesity without serious comorbidity with body mass index (BMI) of 35.0 to 35.9 in adult 01/02/2018  . Heme positive stool 12/05/2017  . Special screening for malignant neoplasms, colon  12/05/2017  . Allergic rhinitis 09/25/2015  . Asthma 09/25/2015  . Cardiac murmur 09/25/2015  . Essential hypertension 09/25/2015  . Anxiety 09/25/2015    Past Surgical History:  Procedure Laterality Date  . BREAST BIOPSY Right 2012?   benign  . BREAST SURGERY     benign biobsy  . COLONOSCOPY WITH PROPOFOL N/A 02/24/2018   Procedure: COLONOSCOPY WITH PROPOFOL;  Surgeon: Lucilla Lame, MD;  Location: [place];  Service: Endoscopy;  Laterality: N/A;  . FRACTURE SURGERY     R lower leg  . POLYPECTOMY     uterine, found on sonohystogram, contributed to menorrhagia   . POLYPECTOMY  02/24/2018   Procedure: POLYPECTOMY;  Surgeon: Lucilla Lame, MD;  Location: [place];  Service: Endoscopy;;    Family History        Family Status  Relation Name Status  . Father  Alive  . Sister  Alive  . MGF  Deceased  . PGM  Deceased  . PGF  Deceased  . Mother  Alive  . Son  Alive  . MGM  Deceased  . Neg Hx  (Not Specified)        Her family history includes Arthritis in her father and paternal grandmother; Asthma in her father; Cancer in her paternal grandmother; Early death in her maternal grandfather; Heart attack in her maternal grandfather; Hypertension in her mother; Skin cancer in her father; Stroke in her paternal grandfather; Thyroid disease in her sister; Uterine cancer in her sister. There is no history of Colon cancer or Breast cancer.      Allergies  Allergen Reactions  . Codeine Nausea And Vomiting  . Penicillins Hives     Current Outpatient Medications:  .  albuterol (PROVENTIL) (2.5 MG/3ML) 0.083% nebulizer solution, Take 3 mLs (2.5 mg total) by nebulization every 6 (six) hours as needed for wheezing or shortness of breath., Disp: 75 mL, Rfl: 12 .  ALPRAZolam (XANAX) 1 MG tablet, Take 1 tablet (1 mg total) by mouth daily as needed for anxiety., Disp: 30 tablet, Rfl: 0 .  amLODipine (NORVASC) 5 MG tablet, Take 1 tablet (5 mg total) by mouth daily., Disp: 90  tablet, Rfl: 3 .  citalopram (CELEXA) 20 MG tablet, Take 1 tablet (20 mg total) by mouth daily., Disp: 90 tablet, Rfl: 3 .  levonorgestrel (MIRENA) 20 MCG/24HR IUD, 1 each by Intrauterine route once., Disp: , Rfl:  .  montelukast (SINGULAIR) 10 MG tablet, Take 1 tablet (10 mg total) by mouth at bedtime., Disp: 90 tablet, Rfl: 3 .  Respiratory Therapy Supplies (NEBULIZER) DEVI, 1 Device by Does not apply route as directed., Disp: 1 each, Rfl: 0 .  topiramate (TOPAMAX) 100 MG tablet, TAKE 1 TABLET BY MOUTH EVERYDAY AT BEDTIME (Patient not taking: Reported on 06/09/2018), Disp: 30 tablet, Rfl: 5   Patient Care  Team: Virginia Crews, MD as PCP - General (Family Medicine)      Objective:   Vitals: BP 122/82 (BP Location: Left Arm, Patient Position: Sitting, Cuff Size: Large)   Pulse 78   Temp 97.9 F (36.6 C) (Oral)   Resp 16   Ht 5\' 4"  (1.626 m)   Wt 208 lb (94.3 kg)   SpO2 95%   BMI 35.70 kg/m    Vitals:   06/09/18 1516  BP: 122/82  Pulse: 78  Resp: 16  Temp: 97.9 F (36.6 C)  TempSrc: Oral  SpO2: 95%  Weight: 208 lb (94.3 kg)  Height: 5\' 4"  (1.626 m)     Physical Exam  Constitutional: She is oriented to person, place, and time. She appears well-developed and well-nourished. No distress.  HENT:  Head: Normocephalic and atraumatic.  Right Ear: External ear normal.  Left Ear: External ear normal.  Nose: Nose normal.  Mouth/Throat: Oropharynx is clear and moist.  Eyes: Pupils are equal, round, and reactive to light. Conjunctivae and EOM are normal. No scleral icterus.  Neck: Neck supple. No thyromegaly present.  Cardiovascular: Normal rate, regular rhythm, normal heart sounds and intact distal pulses.  No murmur heard. Pulmonary/Chest: Effort normal and breath sounds normal. No respiratory distress. She has no wheezes. She has no rales.  Abdominal: Soft. Bowel sounds are normal. She exhibits no distension. There is no tenderness. There is no rebound and no guarding.    Musculoskeletal: She exhibits no edema or deformity.  Lymphadenopathy:    She has no cervical adenopathy.  Neurological: She is alert and oriented to person, place, and time.  Skin: Skin is warm and dry. Capillary refill takes less than 2 seconds. No rash noted.  Psychiatric: She has a normal mood and affect. Her behavior is normal.  Vitals reviewed.    Depression Screen PHQ 2/9 Scores 06/09/2018 03/27/2018 01/02/2018 08/02/2017  PHQ - 2 Score 2 0 0 0  PHQ- 9 Score 4 3 - -     Assessment & Plan:     Routine Health Maintenance and Physical Exam  Exercise Activities and Dietary recommendations Goals   None     Immunization History  Administered Date(s) Administered  . Influenza,inj,Quad PF,6+ Mos 08/13/2013    Health Maintenance  Topic Date Due  . HIV Screening  08/17/1982  . INFLUENZA VACCINE  05/18/2018  . TETANUS/TDAP  12/05/2018 (Originally 08/17/1986)  . PAP SMEAR  08/03/2019  . MAMMOGRAM  01/03/2020  . COLONOSCOPY  02/25/2023     Discussed health benefits of physical activity, and encouraged her to engage in regular exercise appropriate for her age and condition.    --------------------------------------------------------------------  Problem List Items Addressed This Visit      Other   Class 2 obesity without serious comorbidity with body mass index (BMI) of 35.0 to 35.9 in adult    Discussed healthy weight management, diet, and exercise Discussed mindful eating Patient seems to have trouble with overeating and binge eating  she did not tolerate Topamax due to drowsiness Discussed other options for weight loss medications We will try 3 months of phentermine Follow-up in 3 months and consider something like Saxenda or Contrave if she still needs a weight loss medication      Relevant Medications   phentermine 37.5 MG capsule    Other Visit Diagnoses    Encounter for annual physical exam    -  Primary       Return in about 3 months (around  09/09/2018)  for weight f/u.   The entirety of the information documented in the History of Present Illness, Review of Systems and Physical Exam were personally obtained by me. Portions of this information were initially documented by Raquel Sarna Ratchford, CMA and reviewed by me for thoroughness and accuracy.    Virginia Crews, MD, MPH [facility] 06/09/2018 4:38 PM

## 2018-06-09 NOTE — Patient Instructions (Signed)
Preventive Care 40-64 Years, Female Preventive care refers to lifestyle choices and visits with your health care provider that can promote health and wellness. What does preventive care include?  A yearly physical exam. This is also called an annual well check.  Dental exams once or twice a year.  Routine eye exams. Ask your health care provider how often you should have your eyes checked.  Personal lifestyle choices, including: ? Daily care of your teeth and gums. ? Regular physical activity. ? Eating a healthy diet. ? Avoiding tobacco and drug use. ? Limiting alcohol use. ? Practicing safe sex. ? Taking low-dose aspirin daily starting at age 58. ? Taking vitamin and mineral supplements as recommended by your health care provider. What happens during an annual well check? The services and screenings done by your health care provider during your annual well check will depend on your age, overall health, lifestyle risk factors, and family history of disease. Counseling Your health care provider may ask you questions about your:  Alcohol use.  Tobacco use.  Drug use.  Emotional well-being.  Home and relationship well-being.  Sexual activity.  Eating habits.  Work and work Statistician.  Method of birth control.  Menstrual cycle.  Pregnancy history.  Screening You may have the following tests or measurements:  Height, weight, and BMI.  Blood pressure.  Lipid and cholesterol levels. These may be checked every 5 years, or more frequently if you are over 81 years old.  Skin check.  Lung cancer screening. You may have this screening every year starting at age 78 if you have a 30-pack-year history of smoking and currently smoke or have quit within the past 15 years.  Fecal occult blood test (FOBT) of the stool. You may have this test every year starting at age 65.  Flexible sigmoidoscopy or colonoscopy. You may have a sigmoidoscopy every 5 years or a colonoscopy  every 10 years starting at age 30.  Hepatitis C blood test.  Hepatitis B blood test.  Sexually transmitted disease (STD) testing.  Diabetes screening. This is done by checking your blood sugar (glucose) after you have not eaten for a while (fasting). You may have this done every 1-3 years.  Mammogram. This may be done every 1-2 years. Talk to your health care provider about when you should start having regular mammograms. This may depend on whether you have a family history of breast cancer.  BRCA-related cancer screening. This may be done if you have a family history of breast, ovarian, tubal, or peritoneal cancers.  Pelvic exam and Pap test. This may be done every 3 years starting at age 80. Starting at age 36, this may be done every 5 years if you have a Pap test in combination with an HPV test.  Bone density scan. This is done to screen for osteoporosis. You may have this scan if you are at high risk for osteoporosis.  Discuss your test results, treatment options, and if necessary, the need for more tests with your health care provider. Vaccines Your health care provider may recommend certain vaccines, such as:  Influenza vaccine. This is recommended every year.  Tetanus, diphtheria, and acellular pertussis (Tdap, Td) vaccine. You may need a Td booster every 10 years.  Varicella vaccine. You may need this if you have not been vaccinated.  Zoster vaccine. You may need this after age 5.  Measles, mumps, and rubella (MMR) vaccine. You may need at least one dose of MMR if you were born in  1956 or later. You may also need a second dose.  Pneumococcal 13-valent conjugate (PCV13) vaccine. You may need this if you have certain conditions and were not previously vaccinated.  Pneumococcal polysaccharide (PPSV23) vaccine. You may need one or two doses if you smoke cigarettes or if you have certain conditions.  Meningococcal vaccine. You may need this if you have certain  conditions.  Hepatitis A vaccine. You may need this if you have certain conditions or if you travel or work in places where you may be exposed to hepatitis A.  Hepatitis B vaccine. You may need this if you have certain conditions or if you travel or work in places where you may be exposed to hepatitis B.  Haemophilus influenzae type b (Hib) vaccine. You may need this if you have certain conditions.  Talk to your health care provider about which screenings and vaccines you need and how often you need them. This information is not intended to replace advice given to you by your health care provider. Make sure you discuss any questions you have with your health care provider. Document Released: 10/31/2015 Document Revised: 06/23/2016 Document Reviewed: 08/05/2015 Elsevier Interactive Patient Education  2018 Elsevier Inc.  

## 2018-06-09 NOTE — Assessment & Plan Note (Signed)
Discussed healthy weight management, diet, and exercise Discussed mindful eating Patient seems to have trouble with overeating and binge eating  she did not tolerate Topamax due to drowsiness Discussed other options for weight loss medications We will try 3 months of phentermine Follow-up in 3 months and consider something like Saxenda or Contrave if she still needs a weight loss medication

## 2018-06-14 ENCOUNTER — Other Ambulatory Visit: Payer: Self-pay | Admitting: Family Medicine

## 2018-06-14 MED ORDER — PHENTERMINE HCL 30 MG PO TBDP: 30.0000 mg | ORAL_TABLET | Freq: Every day | ORAL | 2 refills | Status: DC

## 2018-09-11 ENCOUNTER — Ambulatory Visit: Payer: Self-pay | Admitting: Family Medicine

## 2018-09-25 ENCOUNTER — Ambulatory Visit: Payer: Commercial Managed Care - PPO | Admitting: Family Medicine

## 2018-09-25 ENCOUNTER — Encounter: Payer: Self-pay | Admitting: Family Medicine

## 2018-09-25 VITALS — BP 133/78 | HR 90 | Temp 97.6°F | Wt 199.6 lb

## 2018-09-25 DIAGNOSIS — F419 Anxiety disorder, unspecified: Secondary | ICD-10-CM

## 2018-09-25 DIAGNOSIS — Z6834 Body mass index (BMI) 34.0-34.9, adult: Secondary | ICD-10-CM

## 2018-09-25 DIAGNOSIS — E669 Obesity, unspecified: Secondary | ICD-10-CM

## 2018-09-25 DIAGNOSIS — I10 Essential (primary) hypertension: Secondary | ICD-10-CM | POA: Diagnosis not present

## 2018-09-25 MED ORDER — NALTREXONE-BUPROPION HCL ER 8-90 MG PO TB12: ORAL_TABLET | ORAL | 0 refills | Status: DC

## 2018-09-25 MED ORDER — ALPRAZOLAM 1 MG PO TABS: 1.0000 mg | ORAL_TABLET | Freq: Every day | ORAL | 0 refills | Status: DC | PRN

## 2018-09-25 MED ORDER — CITALOPRAM HYDROBROMIDE 40 MG PO TABS: 40.0000 mg | ORAL_TABLET | Freq: Every day | ORAL | 3 refills | Status: DC

## 2018-09-25 NOTE — Assessment & Plan Note (Signed)
Discussed healthy weight management, diet, exercise Discussed overeating and binging She did not tolerate topamax due to drowsiness Did not lose much weight on phentermine We discussed saxenda and contrave She is not a good candidate for Belviq or Qysmia Will try Contrave - may need to do PA

## 2018-09-25 NOTE — Progress Notes (Signed)
Patient: Mackenzie Bailey Female    DOB: 1965-12-20   52 y.o.   MRN: [id number] Visit Date: 09/25/2018  Today's Provider: Lavon Paganini, MD   Chief Complaint  Patient presents with  . Hypertension  . Anxiety  . Weight Check   Subjective:    HPI  Hypertension, follow-up:  BP Readings from Last 3 Encounters:  09/25/18 133/78  06/09/18 122/82  05/11/18 116/86    She was last seen for hypertension 6 months ago.  BP at that visit was 130/82. Management changes since that visit include continue medications. She reports good compliance with treatment. She is not having side effects.  She is not exercising. She is not adherent to low salt diet.   Outside blood pressures are not being checked. She is experiencing none.  Patient denies chest pain, chest pressure/discomfort, claudication, dyspnea, exertional chest pressure/discomfort, fatigue, irregular heart beat, lower extremity edema, near-syncope, orthopnea, palpitations, paroxysmal nocturnal dyspnea, syncope and tachypnea.   Cardiovascular risk factors include hypertension and obesity (BMI >= 30 kg/m2).  Use of agents associated with hypertension: none.     Weight trend: decreasing  Wt Readings from Last 3 Encounters:  09/25/18 199 lb 9.6 oz (90.5 kg)  06/09/18 208 lb (94.3 kg)  05/11/18 207 lb (93.9 kg)    Current diet: Patient states she was eating a healthy diet until the past week  ------------------------------------------------------------------------  Anxiety:  Patient presents today for a 6 month follow up. Last OV was on 03/27/2018. Patient advised to continue Celexa 20 mg. She reports good compliance with treatment plan. Symptoms are unstable at this time. Patient states her son started riving this week and she is anxious about that.  She is needed prn Xanax more frequently due to stress and increased anxiety.  She was confused and thought that she could not take Celexa on days when she needed  Xanax.  Last Xanax #30 refill given in 03/2018.    Weight Loss:  Patient presents for a 3 month follow up. Last OV was on 06/09/2018. Patient started Phentermine 37.5 mg. She reports good compliance with treatment plan. She noticed appetite suppression that seemed to wax overtime  Wt Readings from Last 3 Encounters:  09/25/18 199 lb 9.6 oz (90.5 kg)  06/09/18 208 lb (94.3 kg)  05/11/18 207 lb (93.9 kg)     Allergies  Allergen Reactions  . Codeine Nausea And Vomiting  . Penicillins Hives     Current Outpatient Medications:  .  albuterol (PROVENTIL) (2.5 MG/3ML) 0.083% nebulizer solution, Take 3 mLs (2.5 mg total) by nebulization every 6 (six) hours as needed for wheezing or shortness of breath., Disp: 75 mL, Rfl: 12 .  ALPRAZolam (XANAX) 1 MG tablet, Take 1 tablet (1 mg total) by mouth daily as needed for anxiety., Disp: 30 tablet, Rfl: 0 .  amLODipine (NORVASC) 5 MG tablet, Take 1 tablet (5 mg total) by mouth daily., Disp: 90 tablet, Rfl: 3 .  citalopram (CELEXA) 20 MG tablet, Take 1 tablet (20 mg total) by mouth daily., Disp: 90 tablet, Rfl: 3 .  levonorgestrel (MIRENA) 20 MCG/24HR IUD, 1 each by Intrauterine route once., Disp: , Rfl:  .  montelukast (SINGULAIR) 10 MG tablet, Take 1 tablet (10 mg total) by mouth at bedtime., Disp: 90 tablet, Rfl: 3 .  phentermine 37.5 MG capsule, Take 37.5 mg by mouth every morning., Disp: , Rfl: 2 .  Respiratory Therapy Supplies (NEBULIZER) DEVI, 1 Device by Does not apply route as  directed., Disp: 1 each, Rfl: 0  Review of Systems  Constitutional: Negative.   HENT: Negative.   Respiratory: Negative.   Cardiovascular: Negative.   Musculoskeletal: Negative.   Neurological: Negative.   Psychiatric/Behavioral: Negative.     Social History   Tobacco Use  . Smoking status: Never Smoker  . Smokeless tobacco: Never Used  Substance Use Topics  . Alcohol use: Yes    Alcohol/week: 1.0 - 2.0 standard drinks    Types: 1 - 2 Glasses of wine per week    Objective:   BP 133/78 (BP Location: Right Arm, Patient Position: Sitting, Cuff Size: Normal)   Pulse 90   Temp 97.6 F (36.4 C) (Oral)   Wt 199 lb 9.6 oz (90.5 kg)   BMI 34.26 kg/m  Vitals:   09/25/18 0843  BP: 133/78  Pulse: 90  Temp: 97.6 F (36.4 C)  TempSrc: Oral  Weight: 199 lb 9.6 oz (90.5 kg)     Physical Exam  Constitutional: She is oriented to person, place, and time. She appears well-developed and well-nourished. No distress.  HENT:  Head: Normocephalic and atraumatic.  Mouth/Throat: Oropharynx is clear and moist.  Eyes: Conjunctivae are normal. No scleral icterus.  Neck: Neck supple. No thyromegaly present.  Cardiovascular: Normal rate, regular rhythm, normal heart sounds and intact distal pulses.  No murmur heard. Pulmonary/Chest: Effort normal and breath sounds normal. No respiratory distress. She has no wheezes. She has no rales.  Musculoskeletal: She exhibits no edema.  Lymphadenopathy:    She has no cervical adenopathy.  Neurological: She is alert and oriented to person, place, and time.  Skin: Skin is warm and dry. Capillary refill takes less than 2 seconds. No rash noted.  Psychiatric: Her speech is normal. Thought content normal. Her mood appears anxious. Her affect is not blunt. She does not exhibit a depressed mood.  Vitals reviewed.       Assessment & Plan:   Problem List Items Addressed This Visit      Cardiovascular and Mediastinum   Essential hypertension - Primary    Well controlled Reviewed labs Recheck BMP Continue amlodipine 5mg  daily      Relevant Orders   Basic Metabolic Panel (BMET)     Other   Anxiety    Chronic Worsening due to stressors of her son driving Will increase celexa t o40 mg daily Can use Xanax sparingly Discussed synergism with therapy      Relevant Medications   citalopram (CELEXA) 40 MG tablet   ALPRAZolam (XANAX) 1 MG tablet   Obesity    Discussed healthy weight management, diet,  exercise Discussed overeating and binging She did not tolerate topamax due to drowsiness Did not lose much weight on phentermine We discussed saxenda and contrave She is not a good candidate for Belviq or Qysmia Will try Contrave - may need to do PA      Relevant Medications   Naltrexone-buPROPion HCl ER 8-90 MG TB12       Return in about 6 weeks (around 11/06/2018) for weight f/u.   The entirety of the information documented in the History of Present Illness, Review of Systems and Physical Exam were personally obtained by me. Portions of this information were initially documented by Tiburcio Pea, CMA and reviewed by me for thoroughness and accuracy.    Virginia Crews, MD, MPH [facility] 09/25/2018 12:37 PM

## 2018-09-25 NOTE — Assessment & Plan Note (Signed)
Chronic Worsening due to stressors of her son driving Will increase celexa t o40 mg daily Can use Xanax sparingly Discussed synergism with therapy

## 2018-09-25 NOTE — Patient Instructions (Signed)
Bupropion; Naltrexone extended-release tablets What is this medicine? BUPROPION; NALTREXONE (byoo PROE pee on; nal TREX one) is a combination product used to promote and maintain weight loss in obese adults or overweight adults who also have weight related medical problems. This medicine should be used with a reduced calorie diet and increased physical activity. This medicine may be used for other purposes; ask your health care provider or pharmacist if you have questions. COMMON BRAND NAME(S): CONTRAVE What should I tell my health care provider before I take this medicine? They need to know if you have any of these conditions: -an eating disorder, such as anorexia or bulimia -bipolar disorder -diabetes -depression -drug abuse or addiction -glaucoma -head injury -heart disease -high blood pressure -history of a tumor or infection of your brain or spine -history of stroke -history of irregular heartbeat -if you often drink alcohol -kidney disease -liver disease -schizophrenia -seizures -suicidal thoughts, plans, or attempt; a previous suicide attempt by you or a family member -an unusual or allergic reaction to bupropion, naltrexone, other medicines, foods, dyes, or preservatives -breast-feeding -pregnant or trying to become pregnant How should I use this medicine? Take this medicine by mouth with a glass of water. Follow the directions on the prescription label. Take this medicine in the morning and in the evenings as directed by your healthcare professional. Dennis Bast can take it with or without food. Do not take with high-fat meals as this may increase your risk of seizures. Do not crush, chew, or cut these tablets. Do not take your medicine more often than directed. Do not stop taking this medicine suddenly except upon the advice of your doctor. A special MedGuide will be given to you by the pharmacist with each prescription and refill. Be sure to read this information carefully each  time. Talk to your pediatrician regarding the use of this medicine in children. Special care may be needed. Overdosage: If you think you have taken too much of this medicine contact a poison control center or emergency room at once. NOTE: This medicine is only for you. Do not share this medicine with others. What if I miss a dose? If you miss a dose, skip the missed dose and take your next tablet at the regular time. Do not take double or extra doses. What may interact with this medicine? Do not take this medicine with any of the following medications: -any prescription or street opioid drug like codeine, heroin, methadone -linezolid -MAOIs like Carbex, Eldepryl, Marplan, Nardil, and Parnate -methylene blue (injected into a vein) -other medicines that contain bupropion like Zyban or Wellbutrin This medicine may also interact with the following medications: -alcohol -certain medicines for anxiety or sleep -certain medicines for blood pressure like metoprolol, propranolol -certain medicines for depression or psychotic disturbances -certain medicines for HIV or AIDS like efavirenz, lopinavir, nelfinavir, ritonavir -certain medicines for irregular heart beat like propafenone, flecainide -certain medicines for Parkinson's disease like amantadine, levodopa -certain medicines for seizures like carbamazepine, phenytoin, phenobarbital -cimetidine -clopidogrel -cyclophosphamide -digoxin -disulfiram -furazolidone -isoniazid -nicotine -orphenadrine -procarbazine -steroid medicines like prednisone or cortisone -stimulant medicines for attention disorders, weight loss, or to stay awake -tamoxifen -theophylline -thioridazine -thiotepa -ticlopidine -tramadol -warfarin This list may not describe all possible interactions. Give your health care provider a list of all the medicines, herbs, non-prescription drugs, or dietary supplements you use. Also tell them if you smoke, drink alcohol, or use  illegal drugs. Some items may interact with your medicine. What should I watch for while using this  medicine? This medicine is intended to be used in addition to a healthy diet and appropriate exercise. The best results are achieved this way. Do not increase or in any way change your dose without consulting your doctor or health care professional. Do not take this medicine with other prescription or over-the-counter weight loss products without consulting your doctor or health care professional. Your doctor should tell you to stop taking this medicine if you do not lose a certain amount of weight within the first 12 weeks of treatment. Visit your doctor or health care professional for regular checkups. Your doctor may order blood tests or other tests to see how you are doing. This medicine may affect blood sugar levels. If you have diabetes, check with your doctor or health care professional before you change your diet or the dose of your diabetic medicine. Patients and their families should watch out for new or worsening depression or thoughts of suicide. Also watch out for sudden changes in feelings such as feeling anxious, agitated, panicky, irritable, hostile, aggressive, impulsive, severely restless, overly excited and hyperactive, or not being able to sleep. If this happens, especially at the beginning of treatment or after a change in dose, call your health care professional. Avoid alcoholic drinks while taking this medicine. Drinking large amounts of alcoholic beverages, using sleeping or anxiety medicines, or quickly stopping the use of these agents while taking this medicine may increase your risk for a seizure. What side effects may I notice from receiving this medicine? Side effects that you should report to your doctor or health care professional as soon as possible: -allergic reactions like skin rash, itching or hives, swelling of the face, lips, or tongue -breathing problems -changes in  vision -confusion -elevated mood, decreased need for sleep, racing thoughts, impulsive behavior -fast or irregular heartbeat -hallucinations, loss of contact with reality -increased blood pressure -redness, blistering, peeling or loosening of the skin, including inside the mouth -seizures -signs and symptoms of liver injury like dark yellow or brown urine; general ill feeling or flu-like symptoms; light-colored stools; loss of appetite; nausea; right upper belly pain; unusually weak or tired; yellowing of the eyes or skin -suicidal thoughts or other mood changes -vomiting Side effects that usually do not require medical attention (report to your doctor or health care professional if they continue or are bothersome): -constipation -headache -loss of appetite -indigestion, stomach upset -tremors This list may not describe all possible side effects. Call your doctor for medical advice about side effects. You may report side effects to FDA at 1-800-FDA-1088. Where should I keep my medicine? Keep out of the reach of children. Store at room temperature between 15 and 30 degrees C (59 and 86 degrees F). Throw away any unused medicine after the expiration date. NOTE: This sheet is a summary. It may not cover all possible information. If you have questions about this medicine, talk to your doctor, pharmacist, or health care provider.  2018 Elsevier/Gold Standard (2016-03-26 13:42:58)

## 2018-09-25 NOTE — Assessment & Plan Note (Signed)
Well controlled Reviewed labs Recheck BMP Continue amlodipine 5mg  daily

## 2018-09-26 LAB — BASIC METABOLIC PANEL
BUN/Creatinine Ratio: 26 — ABNORMAL HIGH (ref 9–23)
BUN: 17 mg/dL (ref 6–24)
CALCIUM: 9.6 mg/dL (ref 8.7–10.2)
CO2: 22 mmol/L (ref 20–29)
CREATININE: 0.66 mg/dL (ref 0.57–1.00)
Chloride: 106 mmol/L (ref 96–106)
GFR calc Af Amer: 118 mL/min/{1.73_m2} (ref >59)
GFR, EST NON AFRICAN AMERICAN: 103 mL/min/{1.73_m2} (ref >59)
Glucose: 95 mg/dL (ref 65–99)
POTASSIUM: 4.3 mmol/L (ref 3.5–5.2)
Sodium: 141 mmol/L (ref 134–144)

## 2018-11-06 ENCOUNTER — Ambulatory Visit: Payer: Commercial Managed Care - PPO | Admitting: Family Medicine

## 2018-11-13 ENCOUNTER — Other Ambulatory Visit: Payer: Self-pay | Admitting: Family Medicine

## 2018-11-13 DIAGNOSIS — F419 Anxiety disorder, unspecified: Secondary | ICD-10-CM

## 2018-11-13 MED ORDER — CITALOPRAM HYDROBROMIDE 40 MG PO TABS: 40.0000 mg | ORAL_TABLET | Freq: Every day | ORAL | 3 refills | Status: DC

## 2018-11-13 NOTE — Telephone Encounter (Signed)
Pt needing a refill on: citalopram (CELEXA) 40 MG tablet - please call pt to let her know it was called in.  Please fill at:  [id number] [address], [place] (Phone) [phone] (Fax)   Thanks, [facility]

## 2018-11-17 ENCOUNTER — Ambulatory Visit
Admission: RE | Admit: 2018-11-17 | Discharge: 2018-11-17 | Disposition: A | Discharge status: Home / Self Care | Payer: Commercial Managed Care - PPO | Source: Ambulatory Visit | Attending: Family Medicine | Admitting: Family Medicine

## 2018-11-17 ENCOUNTER — Ambulatory Visit: Payer: Commercial Managed Care - PPO | Admitting: Family Medicine

## 2018-11-17 ENCOUNTER — Encounter: Payer: Self-pay | Admitting: Family Medicine

## 2018-11-17 ENCOUNTER — Other Ambulatory Visit: Payer: Self-pay | Admitting: Family Medicine

## 2018-11-17 VITALS — BP 128/83 | HR 87 | Temp 97.8°F | Wt 203.8 lb

## 2018-11-17 DIAGNOSIS — R05 Cough: Secondary | ICD-10-CM | POA: Diagnosis present

## 2018-11-17 DIAGNOSIS — R059 Cough, unspecified: Secondary | ICD-10-CM

## 2018-11-17 DIAGNOSIS — R0989 Other specified symptoms and signs involving the circulatory and respiratory systems: Secondary | ICD-10-CM

## 2018-11-17 DIAGNOSIS — J029 Acute pharyngitis, unspecified: Secondary | ICD-10-CM

## 2018-11-17 LAB — POCT RAPID STREP A (OFFICE): Rapid Strep A Screen: NEGATIVE

## 2018-11-17 MED ORDER — AZITHROMYCIN 250 MG PO TABS: ORAL_TABLET | ORAL | 0 refills | Status: DC

## 2018-11-17 MED ORDER — CEPHALEXIN 500 MG PO CAPS: 500.0000 mg | ORAL_CAPSULE | Freq: Two times a day (BID) | ORAL | 0 refills | Status: AC

## 2018-11-17 NOTE — Patient Instructions (Signed)
Community-Acquired Pneumonia, Adult  Pneumonia is an infection of the lungs. It causes swelling in the airways of the lungs. Mucus and fluid may also build up inside the airways.  One type of pneumonia can happen while a person is in a hospital. A different type can happen when a person is not in a hospital (community-acquired pneumonia).   What are the causes?    This condition is caused by germs (viruses, bacteria, or fungi). Some types of germs can be passed from one person to another. This can happen when you breathe in droplets from the cough or sneeze of an infected person.  What increases the risk?  You are more likely to develop this condition if you:   Have a long-term (chronic) disease, such as:  ? Chronic obstructive pulmonary disease (COPD).  ? Asthma.  ? Cystic fibrosis.  ? Congestive heart failure.  ? Diabetes.  ? Kidney disease.   Have HIV.   Have sickle cell disease.   Have had your spleen removed.   Do not take good care of your teeth and mouth (poor dental hygiene).   Have a medical condition that increases the risk of breathing in droplets from your own mouth and nose.   Have a weakened body defense system (immune system).   Are a smoker.   Travel to areas where the germs that cause this illness are common.   Are around certain animals or the places they live.  What are the signs or symptoms?   A dry cough.   A wet (productive) cough.   Fever.   Sweating.   Chest pain. This often happens when breathing deeply or coughing.   Fast breathing or trouble breathing.   Shortness of breath.   Shaking chills.   Feeling tired (fatigue).   Muscle aches.  How is this treated?  Treatment for this condition depends on many things. Most adults can be treated at home. In some cases, treatment must happen in a hospital. Treatment may include:   Medicines given by mouth or through an IV tube.   Being given extra oxygen.   Respiratory therapy.  In rare cases, treatment for very bad pneumonia  may include:   Using a machine to help you breathe.   Having a procedure to remove fluid from around your lungs.  Follow these instructions at home:  Medicines   Take over-the-counter and prescription medicines only as told by your doctor.  ? Only take cough medicine if you are losing sleep.   If you were prescribed an antibiotic medicine, take it as told by your doctor. Do not stop taking the antibiotic even if you start to feel better.  General instructions     Sleep with your head and neck raised (elevated). You can do this by sleeping in a recliner or by putting a few pillows under your head.   Rest as needed. Get at least 8 hours of sleep each night.   Drink enough water to keep your pee (urine) pale yellow.   Eat a healthy diet that includes plenty of vegetables, fruits, whole grains, low-fat dairy products, and lean protein.   Do not use any products that contain nicotine or tobacco. These include cigarettes, e-cigarettes, and chewing tobacco. If you need help quitting, ask your doctor.   Keep all follow-up visits as told by your doctor. This is important.  How is this prevented?  A shot (vaccine) can help prevent pneumonia. Shots are often suggested for:   People   older than 52 years of age.   People older than 52 years of age who:  ? Are having cancer treatment.  ? Have long-term (chronic) lung disease.  ? Have problems with their body's defense system.  You may also prevent pneumonia if you take these actions:   Get the flu (influenza) shot every year.   Go to the dentist as often as told.   Wash your hands often. If you cannot use soap and water, use hand sanitizer.  Contact a doctor if:   You have a fever.   You lose sleep because your cough medicine does not help.  Get help right away if:   You are short of breath and it gets worse.   You have more chest pain.   Your sickness gets worse. This is very serious if:  ? You are an older adult.  ? Your body's defense system is weak.   You  cough up blood.  Summary   Pneumonia is an infection of the lungs.   Most adults can be treated at home. Some will need treatment in a hospital.   Drink enough water to keep your pee pale yellow.   Get at least 8 hours of sleep each night.  This information is not intended to replace advice given to you by your health care provider. Make sure you discuss any questions you have with your health care provider.  Document Released: 03/22/2008 Document Revised: 06/01/2018 Document Reviewed: 06/01/2018  Elsevier Interactive Patient Education  2019 [employer]

## 2018-11-17 NOTE — Progress Notes (Signed)
Patient advised.

## 2018-11-17 NOTE — Progress Notes (Signed)
Patient: Mackenzie Bailey Female    DOB: 10/23/1965   53 y.o.   MRN: [id number] Visit Date: 11/17/2018  Today's Provider: Lavon Paganini, MD   Chief Complaint  Patient presents with  . Sore Throat   Subjective:    I, Tiburcio Pea, CMA, am acting as a scribe for Lavon Paganini, MD.    Sore Throat   This is a new problem. The current episode started today. The problem has been unchanged. Neither side of throat is experiencing more pain than the other. There has been no fever. Associated symptoms include ear pain. Treatments tried: Ibuprofen. The treatment provided mild relief.   Finished tamiflu ~2 weeks ago.  Starting to feel flu-like again. Also coughing, having chills.  No SOB, sinus pressure.     Allergies  Allergen Reactions  . Codeine Nausea And Vomiting  . Penicillins Hives     Current Outpatient Medications:  .  albuterol (PROVENTIL) (2.5 MG/3ML) 0.083% nebulizer solution, Take 3 mLs (2.5 mg total) by nebulization every 6 (six) hours as needed for wheezing or shortness of breath., Disp: 75 mL, Rfl: 12 .  ALPRAZolam (XANAX) 1 MG tablet, Take 1 tablet (1 mg total) by mouth daily as needed for anxiety., Disp: 30 tablet, Rfl: 0 .  amLODipine (NORVASC) 5 MG tablet, Take 1 tablet (5 mg total) by mouth daily., Disp: 90 tablet, Rfl: 3 .  citalopram (CELEXA) 40 MG tablet, Take 1 tablet (40 mg total) by mouth daily., Disp: 90 tablet, Rfl: 3 .  levonorgestrel (MIRENA) 20 MCG/24HR IUD, 1 each by Intrauterine route once., Disp: , Rfl:  .  montelukast (SINGULAIR) 10 MG tablet, Take 1 tablet (10 mg total) by mouth at bedtime., Disp: 90 tablet, Rfl: 3 .  Naltrexone-buPROPion HCl ER 8-90 MG TB12, Take 1 pill PO daily x1wk, then 1 pill BID x7d, then 2 pill qAM and 1 pill qhs x1wk, then 2 pills PO BID, Disp: 70 tablet, Rfl: 0 .  Respiratory Therapy Supplies (NEBULIZER) DEVI, 1 Device by Does not apply route as directed., Disp: 1 each, Rfl: 0  Review of Systems    Constitutional: Negative.   HENT: Positive for ear pain and sore throat.   Respiratory: Negative.   Cardiovascular: Negative.   Musculoskeletal: Negative.     Social History   Tobacco Use  . Smoking status: Never Smoker  . Smokeless tobacco: Never Used  Substance Use Topics  . Alcohol use: Yes    Alcohol/week: 1.0 - 2.0 standard drinks    Types: 1 - 2 Glasses of wine per week      Objective:   BP 128/83 (BP Location: Right Arm, Patient Position: Sitting, Cuff Size: Large)   Pulse 87   Temp 97.8 F (36.6 C) (Oral)   Wt 203 lb 12.8 oz (92.4 kg)   SpO2 97%   BMI 34.98 kg/m  Vitals:   11/17/18 1336  BP: 128/83  Pulse: 87  Temp: 97.8 F (36.6 C)  TempSrc: Oral  SpO2: 97%  Weight: 203 lb 12.8 oz (92.4 kg)     Physical Exam Vitals signs reviewed.  Constitutional:      General: She is not in acute distress.    Appearance: She is well-developed.  HENT:     Head: Normocephalic and atraumatic.     Right Ear: Tympanic membrane and ear canal normal.     Left Ear: Tympanic membrane and ear canal normal.     Nose: No congestion.  Mouth/Throat:     Mouth: Mucous membranes are moist.     Pharynx: Uvula midline. Posterior oropharyngeal erythema present. No pharyngeal swelling or oropharyngeal exudate.  Eyes:     Conjunctiva/sclera: Conjunctivae normal.     Pupils: Pupils are equal, round, and reactive to light.  Neck:     Musculoskeletal: Neck supple.     Thyroid: No thyromegaly.  Cardiovascular:     Rate and Rhythm: Normal rate and regular rhythm.     Heart sounds: Normal heart sounds. No murmur.  Pulmonary:     Effort: Pulmonary effort is normal. No respiratory distress.     Breath sounds: Rhonchi (in R base) present. No wheezing.  Abdominal:     General: There is no distension.     Palpations: Abdomen is soft.     Tenderness: There is no abdominal tenderness.  Lymphadenopathy:     Cervical: No cervical adenopathy.  Skin:    General: Skin is warm and dry.      Capillary Refill: Capillary refill takes less than 2 seconds.     Findings: No rash.  Neurological:     General: No focal deficit present.     Mental Status: She is alert and oriented to person, place, and time.  Psychiatric:        Mood and Affect: Mood normal.        Behavior: Behavior normal.     Results for orders placed or performed in visit on 11/17/18  POCT rapid strep A  Result Value Ref Range   Rapid Strep A Screen Negative Negative       Assessment & Plan   1. Respiratory crackles at right lung base 2. Cough - possibly 2/2 viral URI - discussed symptomatic management, natural course, and return precautions  - given focal lung exam, however, concerned for possible CAP - get CXR - will treat with keflex and azithro if positive for pneumonia - DG Chest 2 View; Future  3. Sore throat - rapid strep negative - given other symptoms, suspicion for strep is low and will not send culture - POCT rapid strep A    Return if symptoms worsen or fail to improve.   The entirety of the information documented in the History of Present Illness, Review of Systems and Physical Exam were personally obtained by me. Portions of this information were initially documented by Tiburcio Pea, CMA and reviewed by me for thoroughness and accuracy.    Virginia Crews, MD, MPH [facility] 11/17/2018 2:44 PM

## 2018-11-17 NOTE — Progress Notes (Signed)
CXR not formally read yet, but does have patchiness in R base that could represent pneumonia.  Will go ahead and start treatment.  Keflex and Azithro sent to pharmacy.

## 2018-11-20 ENCOUNTER — Telehealth: Payer: Self-pay | Admitting: Family Medicine

## 2018-11-20 NOTE — Telephone Encounter (Signed)
Patient would like to get chest xray results from Friday.

## 2018-11-20 NOTE — Telephone Encounter (Signed)
Patient advised.

## 2018-11-20 NOTE — Telephone Encounter (Signed)
-----   Message from Mar Daring, [place] sent at 11/20/2018 10:50 AM EST ----- CXR normal. Treat symptomatically as discussed with Dr. Jacinto Reap. Call if symptoms worsen or fail to improve over the next 7 days.

## 2019-01-01 ENCOUNTER — Encounter: Payer: Self-pay | Admitting: Family Medicine

## 2019-01-01 ENCOUNTER — Other Ambulatory Visit: Payer: Self-pay

## 2019-01-01 ENCOUNTER — Ambulatory Visit: Payer: Commercial Managed Care - PPO | Admitting: Family Medicine

## 2019-01-01 VITALS — BP 127/84 | HR 78 | Temp 98.0°F | Wt 208.8 lb

## 2019-01-01 DIAGNOSIS — F419 Anxiety disorder, unspecified: Secondary | ICD-10-CM

## 2019-01-01 DIAGNOSIS — Z6835 Body mass index (BMI) 35.0-35.9, adult: Secondary | ICD-10-CM

## 2019-01-01 DIAGNOSIS — G4719 Other hypersomnia: Secondary | ICD-10-CM | POA: Insufficient documentation

## 2019-01-01 DIAGNOSIS — G4700 Insomnia, unspecified: Secondary | ICD-10-CM | POA: Insufficient documentation

## 2019-01-01 DIAGNOSIS — R400 Somnolence: Secondary | ICD-10-CM | POA: Diagnosis not present

## 2019-01-01 MED ORDER — ALPRAZOLAM 1 MG PO TABS: 1.0000 mg | ORAL_TABLET | Freq: Every day | ORAL | 0 refills | Status: DC | PRN

## 2019-01-01 MED ORDER — TRAZODONE HCL 50 MG PO TABS: 25.0000 mg | ORAL_TABLET | Freq: Every evening | ORAL | 3 refills | Status: DC | PRN

## 2019-01-01 MED ORDER — BUPROPION HCL 75 MG PO TABS: 75.0000 mg | ORAL_TABLET | Freq: Two times a day (BID) | ORAL | 3 refills | Status: DC

## 2019-01-01 NOTE — Assessment & Plan Note (Signed)
Discussed healthy weight management, diet, exercise Discussed overeating and binging Patient did not tolerate Topamax due to drowsiness She did not lose much weight on phentermine in the past She was unable to afford Contrave We will try Wellbutrin instead Follow-up in 3 months

## 2019-01-01 NOTE — Assessment & Plan Note (Signed)
Chronic and worsening Likely multifactorial Suspect sleep apnea is contributing See Epworth score Referral for sleep study Can also use trazodone as needed Follow-up in 3 months

## 2019-01-01 NOTE — Assessment & Plan Note (Signed)
Patient with daytime drowsiness and falling asleep when sitting in place at work Suspect she has sleep apnea given her snoring and nonrestorative sleep Referral for sleep study Follow-up in 3 months

## 2019-01-01 NOTE — Progress Notes (Signed)
Patient: Mackenzie Bailey Female    DOB: Dec 01, 1965   53 y.o.   MRN: [id number] Visit Date: 01/01/2019  Today's Provider: Lavon Paganini, MD   Chief Complaint  Patient presents with  . Fatigue  . Weight Loss   Subjective:    HPI  Fatigue Patient presents today for fatigue and sleep disturbance for about 2 months. Patient states she fall asleep at work and takes NyQuil to sleep at night.  Can fall asleep anytime she sits down in a quiet place.  Trouble falling asleep at nighttime when she wants to sleep.  Does snore. Husband has complained about apnea.     Weight Loss Patient would like to started weight loss medication again.  Insurance didn't approve contrave.  Phentermine didn't help in the past.  Allergies  Allergen Reactions  . Codeine Nausea And Vomiting  . Penicillins Hives     Current Outpatient Medications:  .  albuterol (PROVENTIL) (2.5 MG/3ML) 0.083% nebulizer solution, Take 3 mLs (2.5 mg total) by nebulization every 6 (six) hours as needed for wheezing or shortness of breath., Disp: 75 mL, Rfl: 12 .  ALPRAZolam (XANAX) 1 MG tablet, Take 1 tablet (1 mg total) by mouth daily as needed for anxiety., Disp: 30 tablet, Rfl: 0 .  amLODipine (NORVASC) 5 MG tablet, Take 1 tablet (5 mg total) by mouth daily., Disp: 90 tablet, Rfl: 3 .  azithromycin (ZITHROMAX) 250 MG tablet, Take 500mg  PO daily x1d and then 250mg  daily x4 days, Disp: 6 each, Rfl: 0 .  citalopram (CELEXA) 40 MG tablet, Take 1 tablet (40 mg total) by mouth daily., Disp: 90 tablet, Rfl: 3 .  levonorgestrel (MIRENA) 20 MCG/24HR IUD, 1 each by Intrauterine route once., Disp: , Rfl:  .  montelukast (SINGULAIR) 10 MG tablet, Take 1 tablet (10 mg total) by mouth at bedtime., Disp: 90 tablet, Rfl: 3 .  Naltrexone-buPROPion HCl ER 8-90 MG TB12, Take 1 pill PO daily x1wk, then 1 pill BID x7d, then 2 pill qAM and 1 pill qhs x1wk, then 2 pills PO BID, Disp: 70 tablet, Rfl: 0 .  Respiratory Therapy  Supplies (NEBULIZER) DEVI, 1 Device by Does not apply route as directed., Disp: 1 each, Rfl: 0  Review of Systems  Constitutional: Positive for fatigue.  HENT: Negative.   Respiratory: Negative.   Genitourinary: Negative.   Neurological: Negative.   Psychiatric/Behavioral: Positive for sleep disturbance.    Social History   Tobacco Use  . Smoking status: Never Smoker  . Smokeless tobacco: Never Used  Substance Use Topics  . Alcohol use: Yes    Alcohol/week: 1.0 - 2.0 standard drinks    Types: 1 - 2 Glasses of wine per week      Objective:   BP 127/84 (BP Location: Left Arm, Patient Position: Sitting, Cuff Size: Large)   Pulse 78   Temp 98 F (36.7 C) (Oral)   Wt 208 lb 12.8 oz (94.7 kg)   SpO2 99%   BMI 35.84 kg/m  Vitals:   01/01/19 1012  BP: 127/84  Pulse: 78  Temp: 98 F (36.7 C)  TempSrc: Oral  SpO2: 99%  Weight: 208 lb 12.8 oz (94.7 kg)     Physical Exam Vitals signs reviewed.  Constitutional:      General: She is not in acute distress.    Appearance: Normal appearance. She is well-developed. She is obese. She is not diaphoretic.  HENT:     Head: Normocephalic and atraumatic.  Mouth/Throat:     Pharynx: Oropharynx is clear.  Eyes:     General: No scleral icterus.    Conjunctiva/sclera: Conjunctivae normal.  Neck:     Musculoskeletal: Neck supple.     Thyroid: No thyromegaly.  Cardiovascular:     Rate and Rhythm: Normal rate and regular rhythm.     Pulses: Normal pulses.     Heart sounds: Normal heart sounds. No murmur.  Pulmonary:     Effort: Pulmonary effort is normal. No respiratory distress.     Breath sounds: Normal breath sounds. No wheezing, rhonchi or rales.  Musculoskeletal:     Right lower leg: No edema.     Left lower leg: No edema.  Lymphadenopathy:     Cervical: No cervical adenopathy.  Skin:    General: Skin is warm and dry.     Capillary Refill: Capillary refill takes less than 2 seconds.     Findings: No rash.   Neurological:     Mental Status: She is alert and oriented to person, place, and time. Mental status is at baseline.  Psychiatric:        Mood and Affect: Mood normal.        Behavior: Behavior normal.         Assessment & Plan   Problem List Items Addressed This Visit      Other   Anxiety    Chronic and stable Continue Celexa 40 mg daily Continue Xanax sparingly Discussed synergism with therapy      Relevant Medications   buPROPion (WELLBUTRIN) 75 MG tablet   traZODone (DESYREL) 50 MG tablet   ALPRAZolam (XANAX) 1 MG tablet   Obesity    Discussed healthy weight management, diet, exercise Discussed overeating and binging Patient did not tolerate Topamax due to drowsiness She did not lose much weight on phentermine in the past She was unable to afford Contrave We will try Wellbutrin instead Follow-up in 3 months      Relevant Orders   Ambulatory referral to Sleep Studies   Insomnia - Primary    Chronic and worsening Likely multifactorial Suspect sleep apnea is contributing See Epworth score Referral for sleep study Can also use trazodone as needed Follow-up in 3 months      Relevant Orders   TSH   CBC w/Diff/Platelet   Comprehensive metabolic panel   E09   VITAMIN D 25 Hydroxy (Vit-D Deficiency, Fractures)   Ambulatory referral to Sleep Studies   Has daytime drowsiness    Patient with daytime drowsiness and falling asleep when sitting in place at work Suspect she has sleep apnea given her snoring and nonrestorative sleep Referral for sleep study Follow-up in 3 months      Relevant Orders   TSH   CBC w/Diff/Platelet   B12   VITAMIN D 25 Hydroxy (Vit-D Deficiency, Fractures)   Ambulatory referral to Sleep Studies       Return in about 3 months (around 04/03/2019) for insomnia, weight f/u.   The entirety of the information documented in the History of Present Illness, Review of Systems and Physical Exam were personally obtained by me. Portions of  this information were initially documented by Hurman Horn, CMA and reviewed by me for thoroughness and accuracy.    Virginia Crews, MD, MPH [facility] 01/01/2019 11:56 AM

## 2019-01-01 NOTE — Patient Instructions (Signed)

## 2019-01-01 NOTE — Assessment & Plan Note (Signed)
Chronic and stable Continue Celexa 40 mg daily Continue Xanax sparingly Discussed synergism with therapy

## 2019-01-02 LAB — COMPREHENSIVE METABOLIC PANEL
ALBUMIN: 3.8 g/dL (ref 3.8–4.9)
ALK PHOS: 80 IU/L (ref 39–117)
ALT: 11 IU/L (ref 0–32)
AST: 11 IU/L (ref 0–40)
Albumin/Globulin Ratio: 1.5 (ref 1.2–2.2)
BUN / CREAT RATIO: 19 (ref 9–23)
BUN: 11 mg/dL (ref 6–24)
CALCIUM: 9 mg/dL (ref 8.7–10.2)
CHLORIDE: 104 mmol/L (ref 96–106)
CO2: 21 mmol/L (ref 20–29)
CREATININE: 0.57 mg/dL (ref 0.57–1.00)
GFR calc Af Amer: 124 mL/min/{1.73_m2} (ref >59)
GFR calc non Af Amer: 108 mL/min/{1.73_m2} (ref >59)
GLOBULIN, TOTAL: 2.6 g/dL (ref 1.5–4.5)
GLUCOSE: 92 mg/dL (ref 65–99)
POTASSIUM: 4.2 mmol/L (ref 3.5–5.2)
SODIUM: 141 mmol/L (ref 134–144)
Total Protein: 6.4 g/dL (ref 6.0–8.5)

## 2019-01-02 LAB — CBC WITH DIFFERENTIAL/PLATELET
BASOS ABS: 0 10*3/uL (ref 0.0–0.2)
Basos: 0 %
EOS (ABSOLUTE): 0.1 10*3/uL (ref 0.0–0.4)
Eos: 1 %
HEMATOCRIT: 37.6 % (ref 34.0–46.6)
HEMOGLOBIN: 12.2 g/dL (ref 11.1–15.9)
IMMATURE GRANULOCYTES: 0 %
Immature Grans (Abs): 0 10*3/uL (ref 0.0–0.1)
LYMPHS: 27 %
Lymphocytes Absolute: 1.9 10*3/uL (ref 0.7–3.1)
MCH: 26 pg — ABNORMAL LOW (ref 26.6–33.0)
MCHC: 32.4 g/dL (ref 31.5–35.7)
MCV: 80 fL (ref 79–97)
MONOCYTES: 5 %
Monocytes Absolute: 0.4 10*3/uL (ref 0.1–0.9)
NEUTROS PCT: 67 %
Neutrophils Absolute: 4.8 10*3/uL (ref 1.4–7.0)
Platelets: 302 10*3/uL (ref 150–450)
RBC: 4.7 x10E6/uL (ref 3.77–5.28)
RDW: 14.3 % (ref 11.7–15.4)
WBC: 7.2 10*3/uL (ref 3.4–10.8)

## 2019-01-02 LAB — VITAMIN D 25 HYDROXY (VIT D DEFICIENCY, FRACTURES): Vit D, 25-Hydroxy: 13.3 ng/mL — ABNORMAL LOW (ref 30.0–100.0)

## 2019-01-02 LAB — VITAMIN B12: VITAMIN B 12: 283 pg/mL (ref 232–1245)

## 2019-01-02 LAB — TSH: TSH: 1.28 u[IU]/mL (ref 0.450–4.500)

## 2019-01-03 ENCOUNTER — Telehealth: Payer: Self-pay

## 2019-01-03 MED ORDER — VITAMIN D (ERGOCALCIFEROL) 1.25 MG (50000 UNIT) PO CAPS: 50000.0000 [IU] | ORAL_CAPSULE | ORAL | 0 refills | Status: DC

## 2019-01-03 NOTE — Telephone Encounter (Signed)
-----   Message from Virginia Crews, MD sent at 01/03/2019  9:22 AM EDT ----- Normal labs, except significantly low Vit D.  Would like to treat with 12 weeks of high dose weekly supplement (ergocalciferol 50,000 units 1 tab weekly #12 r0) and then recheck

## 2019-01-03 NOTE — Telephone Encounter (Signed)
Patient advised. Patient agrees to start Vitamin D. RX sent to [facility] pharmacy. She will call back to request a lab slip in 12 weeks to have Vitamin D rechecked.

## 2019-01-23 ENCOUNTER — Other Ambulatory Visit: Payer: Self-pay | Admitting: Family Medicine

## 2019-02-16 ENCOUNTER — Telehealth: Payer: Self-pay

## 2019-02-16 NOTE — Telephone Encounter (Signed)
Spoke to pharmacy and the rx is for citalopram 40 mg once daily and that is what is in her chart.  I gave the ok to fill the rx to the pharmacist.  Also left message on pt's vm that she is only to take two 20 mg tabs or one 40 mg.  dbs

## 2019-02-16 NOTE — Telephone Encounter (Signed)
[facility] pharmacy sent notification that patient s requesting a new RX for Citalopram 40 mg. Pharmacy states patient is taking 2 tablets per day.

## 2019-02-16 NOTE — Telephone Encounter (Signed)
She should not be taking 2 tabs of the 40mg  pills at a time.  I think she was taking 2 of the 20mg  pills previously

## 2019-03-10 IMAGING — MG MM DIGITAL SCREENING BILAT W/ TOMO W/ CAD
8 of 12 series · 8 of 28 positions shown · non-contrast
Comparison: Previous exam(s).

ACR Breast Density Category a: The breast tissue is almost entirely
fatty.

CLINICAL DATA: Screening.

EXAM:
DIGITAL SCREENING BILATERAL MAMMOGRAM WITH TOMO AND CAD

[R MLO synth-2D]
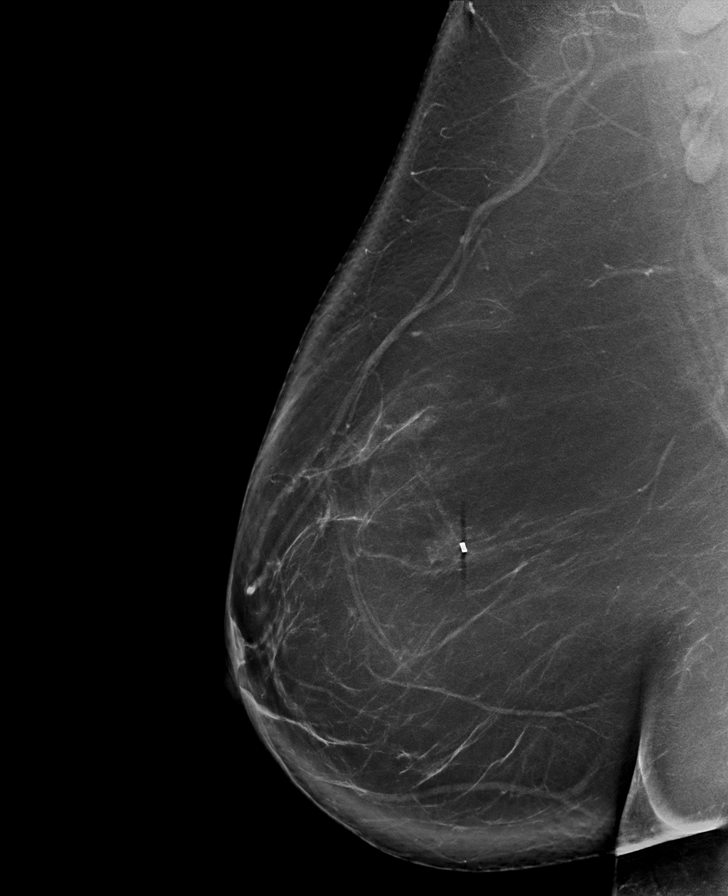

[R MLO]
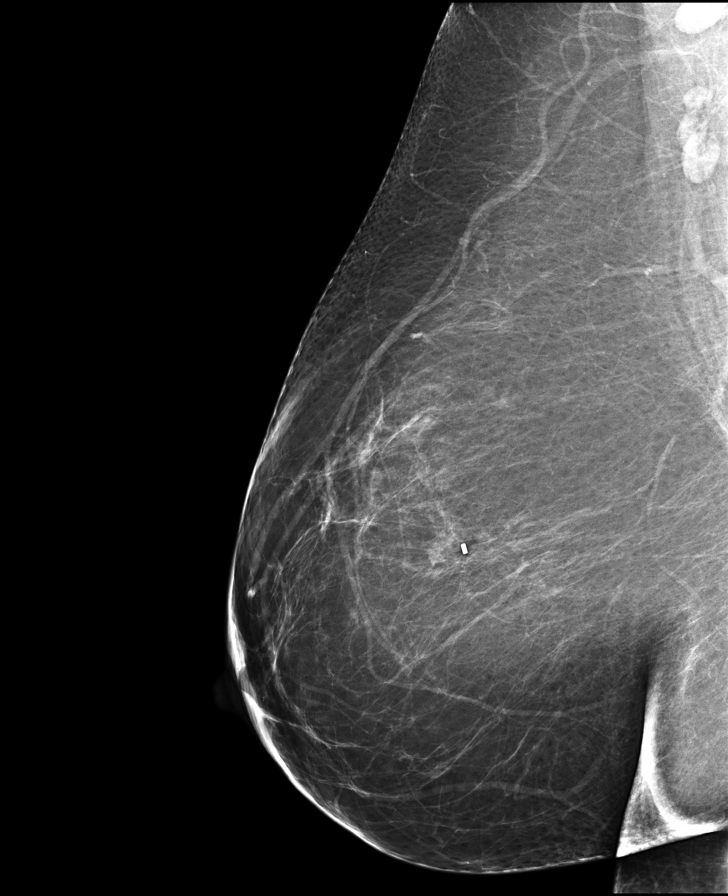

[L MLO]
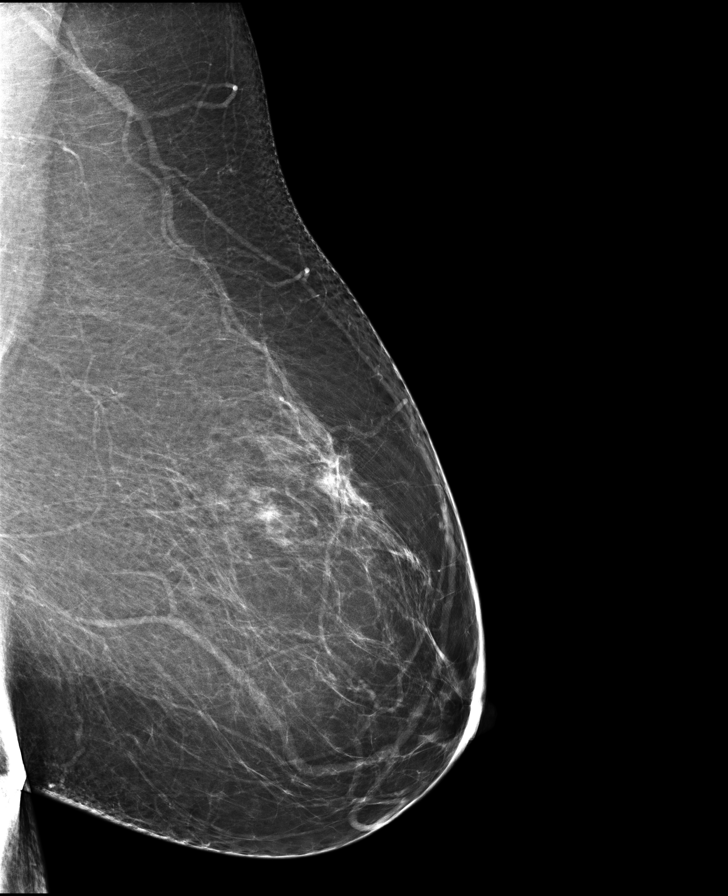

[L CC synth-2D]
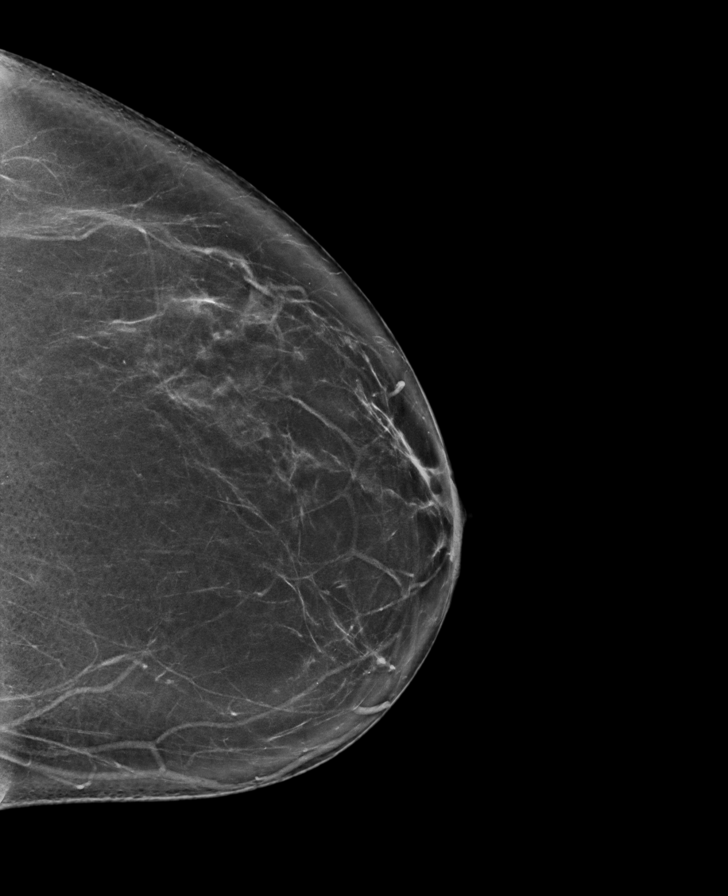

[R CC synth-2D]
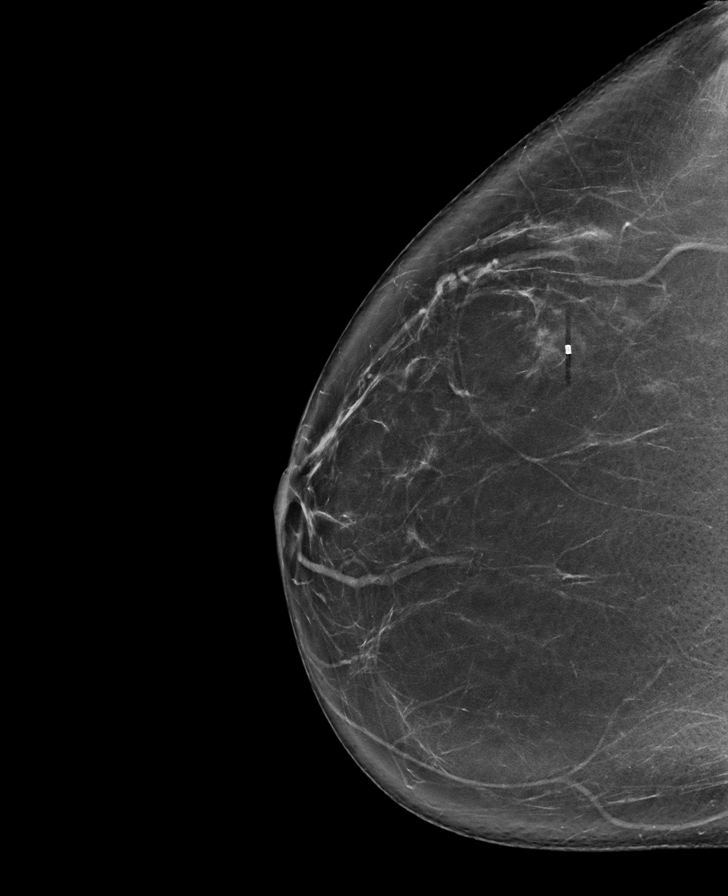

[R CC]
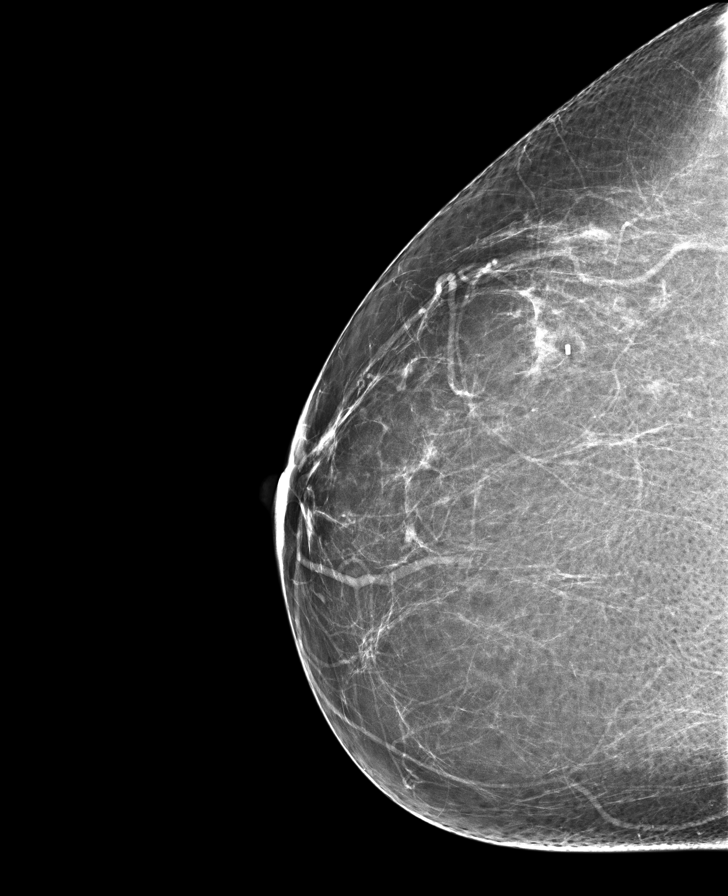

[L MLO synth-2D]
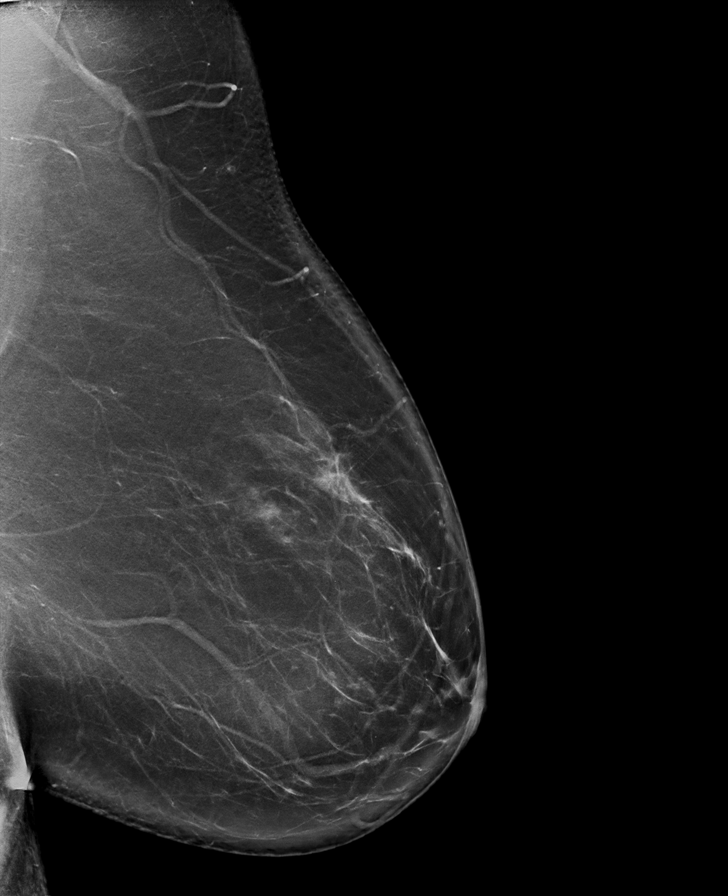

[L CC]
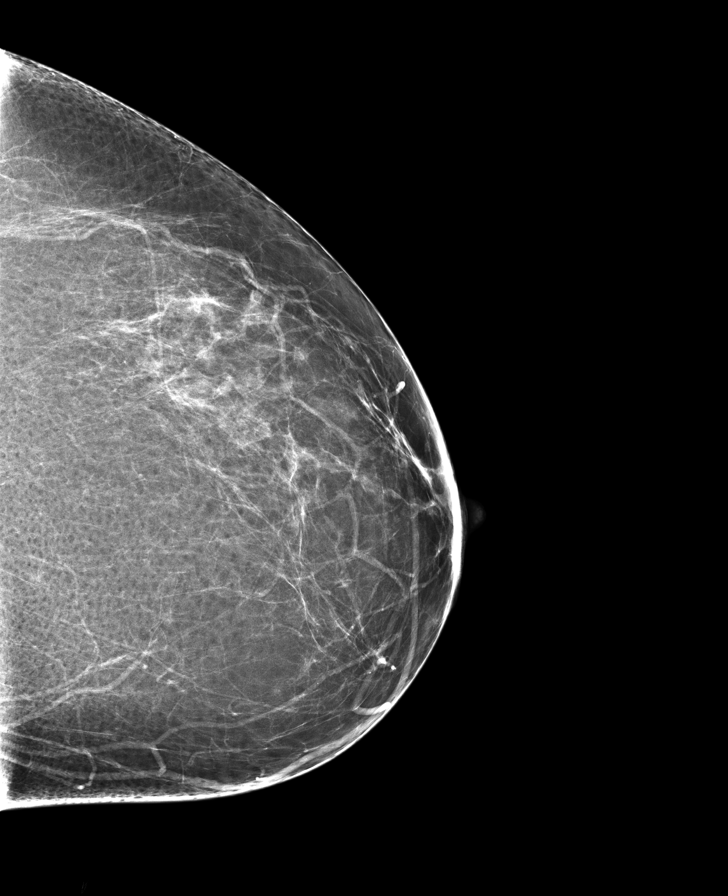

[8 of 28 positions shown; findings below may reference images not displayed]

FINDINGS: There are no findings suspicious for malignancy. Images were
processed with CAD.
IMPRESSION: No mammographic evidence of malignancy. A result letter of this
screening mammogram will be mailed directly to the patient.

RECOMMENDATION:
Screening mammogram in one year. (Code:[id number])

BI-RADS CATEGORY  1: Negative.

## 2019-03-24 ENCOUNTER — Other Ambulatory Visit: Payer: Self-pay | Admitting: Family Medicine

## 2019-03-28 ENCOUNTER — Telehealth: Payer: Self-pay

## 2019-03-28 DIAGNOSIS — E559 Vitamin D deficiency, unspecified: Secondary | ICD-10-CM

## 2019-03-28 DIAGNOSIS — Z86018 Personal history of other benign neoplasm: Secondary | ICD-10-CM

## 2019-03-28 HISTORY — DX: Personal history of other benign neoplasm: Z86.018

## 2019-03-28 NOTE — Telephone Encounter (Signed)
Wait until labs are repeated. Probably an automated refill request

## 2019-03-28 NOTE — Telephone Encounter (Signed)
Patient calling that she received a call from the pharmacy that her Vit D was ready. She is asking if she should take it or wait until she gets her labs recheck. Reports that she had her labs done back in March and was advised to get labs done after finishing the Vit D.  CB#[phone]

## 2019-03-28 NOTE — Telephone Encounter (Signed)
Patient advised. Lab slipped printed at the front desk for repeat Vitamin D. Patient will come by tomorrow for labs.

## 2019-04-03 LAB — VITAMIN D 1,25 DIHYDROXY
Vitamin D 1, 25 (OH)2 Total: 47 pg/mL
Vitamin D2 1, 25 (OH)2: 28 pg/mL
Vitamin D3 1, 25 (OH)2: 19 pg/mL

## 2019-04-16 ENCOUNTER — Other Ambulatory Visit: Payer: Self-pay | Admitting: Obstetrics and Gynecology

## 2019-04-16 ENCOUNTER — Other Ambulatory Visit: Payer: Self-pay | Admitting: Family Medicine

## 2019-04-16 ENCOUNTER — Ambulatory Visit (INDEPENDENT_AMBULATORY_CARE_PROVIDER_SITE_OTHER): Payer: Managed Care, Other (non HMO) | Admitting: Family Medicine

## 2019-04-16 ENCOUNTER — Encounter: Payer: Self-pay | Admitting: Family Medicine

## 2019-04-16 VITALS — Wt 197.0 lb

## 2019-04-16 DIAGNOSIS — Z1231 Encounter for screening mammogram for malignant neoplasm of breast: Secondary | ICD-10-CM

## 2019-04-16 DIAGNOSIS — Z6833 Body mass index (BMI) 33.0-33.9, adult: Secondary | ICD-10-CM

## 2019-04-16 DIAGNOSIS — E559 Vitamin D deficiency, unspecified: Secondary | ICD-10-CM

## 2019-04-16 DIAGNOSIS — F4323 Adjustment disorder with mixed anxiety and depressed mood: Secondary | ICD-10-CM | POA: Insufficient documentation

## 2019-04-16 DIAGNOSIS — E669 Obesity, unspecified: Secondary | ICD-10-CM

## 2019-04-16 MED ORDER — BUPROPION HCL ER (XL) 150 MG PO TB24: 150.0000 mg | ORAL_TABLET | Freq: Every day | ORAL | 2 refills | Status: DC

## 2019-04-16 NOTE — Assessment & Plan Note (Signed)
Patient has GAD at baseline and has previously been doing well on Celexa Her anxiety seems to be exacerbated to current situation due to being furloughed due to the pandemic Discussed with her that medications do not help very much was situational anxiety is a situational story present Encourage therapy Discussed mindfulness and meditation No changes to medications currently

## 2019-04-16 NOTE — Progress Notes (Signed)
Patient: Mackenzie Bailey Female    DOB: 1966-08-25   53 y.o.   MRN: [id number] Visit Date: 04/16/2019  Today's Provider: Lavon Paganini, MD   Chief Complaint  Patient presents with  . Weight Check   Subjective:  Virtual Visit via Telephone Note  I connected with Mackenzie Bailey on 04/16/19 at  9:40 AM EDT by telephone and verified that I am speaking with the correct person using two identifiers.   Patient location: home Provider location: [place] involved in the visit: patient, provider    I discussed the limitations, risks, security and privacy concerns of performing an evaluation and management service by telephone and the availability of in person appointments. I also discussed with the patient that there may be a patient responsible charge related to this service. The patient expressed understanding and agreed to proceed.  HPI Weight Check:  Patient present today for 3 month follow-up weight loss counseling. Patient started Wellbutrin. She reports good compliance with treatment plan.  Last weight was 208 lb. Today weight is 197 lbs. She reports not exercising regularly.   Wt Readings from Last 3 Encounters:  04/16/19 197 lb (89.4 kg)  01/01/19 208 lb 12.8 oz (94.7 kg)  11/17/18 203 lb 12.8 oz (92.4 kg)   Anxiety and depression are worsening Has been exercising Furlough extended through August Thinks this is related to being stuck at home with nothing to do  Depression screen [facility] 2/9 04/16/2019 06/09/2018 03/27/2018 01/02/2018 08/02/2017  Decreased Interest 3 1 0 0 0  Down, Depressed, Hopeless 3 1 0 0 0  PHQ - 2 Score 6 2 0 0 0  Altered sleeping 2 0 1 - -  Tired, decreased energy 3 1 1  - -  Change in appetite 3 1 1  - -  Feeling bad or failure about yourself  2 0 0 - -  Trouble concentrating 2 0 0 - -  Moving slowly or fidgety/restless 0 0 0 - -  Suicidal thoughts 0 0 0 - -  PHQ-9 Score 18 4 3  - -  Difficult doing  work/chores Somewhat difficult Not difficult at all Not difficult at all - -    GAD 7 : Generalized Anxiety Score 04/16/2019 03/27/2018 01/02/2018  Nervous, Anxious, on Edge 3 0 1  Control/stop worrying 3 0 1  Worry too much - different things 3 0 1  Trouble relaxing 2 1 1   Restless 1 0 0  Easily annoyed or irritable 2 0 1  Afraid - awful might happen 2 1 2   Total GAD 7 Score 16 2 7   Anxiety Difficulty Somewhat difficult Not difficult at all Not difficult at all      Allergies  Allergen Reactions  . Codeine Nausea And Vomiting  . Penicillins Hives     Current Outpatient Medications:  .  albuterol (PROVENTIL) (2.5 MG/3ML) 0.083% nebulizer solution, Take 3 mLs (2.5 mg total) by nebulization every 6 (six) hours as needed for wheezing or shortness of breath., Disp: 75 mL, Rfl: 12 .  ALPRAZolam (XANAX) 1 MG tablet, Take 1 tablet (1 mg total) by mouth daily as needed for anxiety., Disp: 30 tablet, Rfl: 0 .  amLODipine (NORVASC) 5 MG tablet, Take 1 tablet (5 mg total) by mouth daily., Disp: 90 tablet, Rfl: 3 .  citalopram (CELEXA) 40 MG tablet, Take 1 tablet (40 mg total) by mouth daily., Disp: 90 tablet, Rfl: 3 .  levonorgestrel (MIRENA) 20 MCG/24HR IUD, 1 each  by Intrauterine route once., Disp: , Rfl:  .  montelukast (SINGULAIR) 10 MG tablet, Take 1 tablet (10 mg total) by mouth at bedtime., Disp: 90 tablet, Rfl: 3 .  Respiratory Therapy Supplies (NEBULIZER) DEVI, 1 Device by Does not apply route as directed., Disp: 1 each, Rfl: 0 .  traZODone (DESYREL) 50 MG tablet, TAKE 0.5-1 TABLETS (25-50 MG TOTAL) BY MOUTH AT BEDTIME AS NEEDED FOR SLEEP., Disp: 90 tablet, Rfl: 2 .  buPROPion (WELLBUTRIN XL) 150 MG 24 hr tablet, Take 1 tablet (150 mg total) by mouth daily., Disp: 30 tablet, Rfl: 2   Review of Systems  Constitutional: Negative.   Respiratory: Negative.   Cardiovascular: Negative.   Musculoskeletal: Negative.     Social History   Tobacco Use  . Smoking status: Never Smoker  .  Smokeless tobacco: Never Used  Substance Use Topics  . Alcohol use: Yes    Alcohol/week: 1.0 - 2.0 standard drinks    Types: 1 - 2 Glasses of wine per week      Objective:   Wt 197 lb (89.4 kg)   BMI 33.81 kg/m  Vitals:   04/16/19 0943  Weight: 197 lb (89.4 kg)     Physical Exam   No results found for any visits on 04/16/19.     Assessment & Plan   Follow Up Instructions: I discussed the assessment and treatment plan with the patient. The patient was provided an opportunity to ask questions and all were answered. The patient agreed with the plan and demonstrated an understanding of the instructions.   The patient was advised to call back or seek an in-person evaluation if the symptoms worsen or if the condition fails to improve as anticipated.  Problem List Items Addressed This Visit      Other   Obesity - Primary    Again discussed healthy weight management, diet, exercise Discussed her overeating and binging Discussed mindful eating and avoiding eating because she is bored or anxious She did not tolerate Topamax due to drowsiness She did not lose much weight on phentermine in the past She was unable to afford Contrave She is doing well on Wellbutrin has lost some weight by 8 current stressors due to the pandemic We will change her Wellbutrin dose to XL formulation as she is having trouble remembering her second dose of the day Follow-up in 3 months      Adjustment disorder with mixed anxiety and depressed mood    Patient has GAD at baseline and has previously been doing well on Celexa Her anxiety seems to be exacerbated to current situation due to being furloughed due to the pandemic Discussed with her that medications do not help very much was situational anxiety is a situational story present Encourage therapy Discussed mindfulness and meditation No changes to medications currently       Other Visit Diagnoses    Vitamin D deficiency        -Finished  high-dose weekly supplement x3 weeks -Recent vitamin D rechecked and was normal -Advised 1000 to 2000 units of vitamin D3 daily   Return in about 3 months (around 07/17/2019) for CPE.   The entirety of the information documented in the History of Present Illness, Review of Systems and Physical Exam were personally obtained by me. Portions of this information were initially documented by Tiburcio Pea, CMA and reviewed by me for thoroughness and accuracy.    Satara Virella, Dionne Bucy, MD MPH [facility]

## 2019-04-16 NOTE — Assessment & Plan Note (Signed)
Again discussed healthy weight management, diet, exercise Discussed her overeating and binging Discussed mindful eating and avoiding eating because she is bored or anxious She did not tolerate Topamax due to drowsiness She did not lose much weight on phentermine in the past She was unable to afford Contrave She is doing well on Wellbutrin has lost some weight by 8 current stressors due to the pandemic We will change her Wellbutrin dose to XL formulation as she is having trouble remembering her second dose of the day Follow-up in 3 months

## 2019-04-17 ENCOUNTER — Ambulatory Visit
Admission: RE | Admit: 2019-04-17 | Discharge: 2019-04-17 | Disposition: A | Discharge status: Home / Self Care | Payer: Managed Care, Other (non HMO) | Source: Ambulatory Visit | Attending: Family Medicine | Admitting: Family Medicine

## 2019-04-17 ENCOUNTER — Other Ambulatory Visit: Payer: Self-pay

## 2019-04-17 DIAGNOSIS — Z1231 Encounter for screening mammogram for malignant neoplasm of breast: Secondary | ICD-10-CM | POA: Diagnosis present

## 2019-04-18 ENCOUNTER — Encounter: Payer: Self-pay | Admitting: Obstetrics and Gynecology

## 2019-05-09 ENCOUNTER — Other Ambulatory Visit: Payer: Self-pay | Admitting: Family Medicine

## 2019-05-09 NOTE — Telephone Encounter (Signed)
L.O.V. was 04/16/2019, please advise

## 2019-05-16 ENCOUNTER — Other Ambulatory Visit: Payer: Self-pay

## 2019-05-16 ENCOUNTER — Encounter: Payer: Self-pay | Admitting: Obstetrics and Gynecology

## 2019-05-16 ENCOUNTER — Ambulatory Visit (INDEPENDENT_AMBULATORY_CARE_PROVIDER_SITE_OTHER): Payer: Managed Care, Other (non HMO) | Admitting: Obstetrics and Gynecology

## 2019-05-16 VITALS — BP 120/80 | Ht 64.0 in | Wt 199.0 lb

## 2019-05-16 DIAGNOSIS — N920 Excessive and frequent menstruation with regular cycle: Secondary | ICD-10-CM

## 2019-05-16 DIAGNOSIS — Z4689 Encounter for fitting and adjustment of other specified devices: Secondary | ICD-10-CM

## 2019-05-16 DIAGNOSIS — Z30431 Encounter for routine checking of intrauterine contraceptive device: Secondary | ICD-10-CM | POA: Diagnosis not present

## 2019-05-16 DIAGNOSIS — R32 Unspecified urinary incontinence: Secondary | ICD-10-CM | POA: Diagnosis not present

## 2019-05-16 NOTE — Patient Instructions (Signed)
I value your feedback and entrusting us with your care. If you get a [facility] patient survey, I would appreciate you taking the time to let us know about your experience today. Thank you! 

## 2019-05-16 NOTE — Progress Notes (Signed)
Virginia Crews, MD   Chief Complaint  Patient presents with  . Metrorrhagia    last four months heavy periods, no abnormal pain    HPI:      [name] is a 53 y.o. G1P1001 who LMP was Patient's last menstrual period was 05/09/2019 (exact date)., presents today for increasingly heavy periods the past 4 months. Menses are monthly, lasting 6-7 days with 2 heavy days, changing pads Q1-2 hrs. Also with large clots. Normal TSH 3/20 with PCP. Menses used to be 2 days and light, but have changed recently since [place] about 53 yrs old. Pt also with dysmen with heavier periods, improved with NSAIDs. Mirena placed ~05/2015 for menorrhagia--couldn't have endometrial ablation at time of hysteroscopy/polypectomy in [facility]. Pt states IUD insertion was extremely painful and she can't handle that again. Also can't do OCPs due to HTN.  She is occas sex active, no dyspareunia/bleeding. She also has issues with worsening incontinence, triggered by cough/sneeze but also movement. Can fully wet herself while getting up off floor. Has pessary currently and has done kegels. Interested in surgery.   Past Medical History:  Diagnosis Date  . Allergy   . Anxiety   . Asthma   . Female cystocele   . Heart murmur   . Hypertension   . Menorrhagia   . Mitral valve prolapse   . SUI (stress urinary incontinence, female)   . Vaginal pessary present     Past Surgical History:  Procedure Laterality Date  . BREAST BIOPSY Right 2012?   benign  . BREAST SURGERY     benign biobsy  . COLONOSCOPY WITH PROPOFOL N/A 02/24/2018   Procedure: COLONOSCOPY WITH PROPOFOL;  Surgeon: Lucilla Lame, MD;  Location: [place];  Service: Endoscopy;  Laterality: N/A;  . FRACTURE SURGERY     R lower leg  . POLYPECTOMY     uterine, found on sonohystogram, contributed to menorrhagia   . POLYPECTOMY  02/24/2018   Procedure: POLYPECTOMY;  Surgeon: Lucilla Lame, MD;  Location: [place];  Service:  Endoscopy;;    Family History  Problem Relation Age of Onset  . Arthritis Father   . Asthma Father   . Skin cancer Father        melanoma  . Thyroid disease Sister   . Uterine cancer Sister   . Heart attack Maternal Grandfather   . Early death Maternal Grandfather        massive heart attack  . Arthritis Paternal Grandmother   . Cancer Paternal Grandmother        uterine  . Stroke Paternal Grandfather   . Hypertension Mother   . Colon cancer Neg Hx   . Breast cancer Neg Hx     Social History   Socioeconomic History  . Marital status: Married    Spouse name: Phu  . Number of children: 1  . Years of education: 21  . Highest education level: Some college, no degree  Occupational History    Employer: [employer]  Social Needs  . Financial resource strain: Not hard at all  . Food insecurity    Worry: Never true    Inability: Never true  . Transportation needs    Medical: No    Non-medical: No  Tobacco Use  . Smoking status: Never Smoker  . Smokeless tobacco: Never Used  Substance and Sexual Activity  . Alcohol use: Yes    Alcohol/week: 1.0 - 2.0 standard drinks  Types: 1 - 2 Glasses of wine per week  . Drug use: No  . Sexual activity: Yes    Partners: Male    Birth control/protection: I.U.D.    Comment: Mirena  Lifestyle  . Physical activity    Days per week: 0 days    Minutes per session: Not on file  . Stress: To some extent  Relationships  . Social connections    Talks on phone: More than three times a week    Gets together: Once a week    Attends religious service: 1 to 4 times per year    Active member of club or organization: No    Attends meetings of clubs or organizations: Never    Relationship status: Married  . Intimate partner violence    Fear of current or ex partner: No    Emotionally abused: No    Physically abused: No    Forced sexual activity: No  Other Topics Concern  . Not on file  Social History Narrative  . Not on  file    Outpatient Medications Prior to Visit  Medication Sig Dispense Refill  . albuterol (PROVENTIL) (2.5 MG/3ML) 0.083% nebulizer solution Take 3 mLs (2.5 mg total) by nebulization every 6 (six) hours as needed for wheezing or shortness of breath. 75 mL 12  . ALPRAZolam (XANAX) 1 MG tablet Take 1 tablet (1 mg total) by mouth daily as needed for anxiety. 30 tablet 0  . amLODipine (NORVASC) 5 MG tablet Take 1 tablet (5 mg total) by mouth daily. 90 tablet 3  . buPROPion (WELLBUTRIN XL) 150 MG 24 hr tablet TAKE 1 TABLET BY MOUTH EVERY DAY 90 tablet 1  . citalopram (CELEXA) 40 MG tablet Take 1 tablet (40 mg total) by mouth daily. 90 tablet 3  . levonorgestrel (MIRENA) 20 MCG/24HR IUD 1 each by Intrauterine route once.    . montelukast (SINGULAIR) 10 MG tablet Take 1 tablet (10 mg total) by mouth at bedtime. 90 tablet 3  . traZODone (DESYREL) 50 MG tablet TAKE 0.5-1 TABLETS (25-50 MG TOTAL) BY MOUTH AT BEDTIME AS NEEDED FOR SLEEP. 90 tablet 2  . buPROPion (WELLBUTRIN) 75 MG tablet     . Respiratory Therapy Supplies (NEBULIZER) DEVI 1 Device by Does not apply route as directed. 1 each 0   No facility-administered medications prior to visit.       ROS:  Review of Systems  Constitutional: Negative for fatigue, fever and unexpected weight change.  Respiratory: Negative for cough, shortness of breath and wheezing.   Cardiovascular: Negative for chest pain, palpitations and leg swelling.  Gastrointestinal: Negative for blood in stool, constipation, diarrhea, nausea and vomiting.  Endocrine: Negative for cold intolerance, heat intolerance and polyuria.  Genitourinary: Positive for frequency and menstrual problem. Negative for dyspareunia, dysuria, flank pain, genital sores, hematuria, pelvic pain, urgency, vaginal bleeding, vaginal discharge and vaginal pain.  Musculoskeletal: Negative for back pain, joint swelling and myalgias.  Skin: Negative for rash.  Neurological: Negative for dizziness,  syncope, light-headedness, numbness and headaches.  Hematological: Negative for adenopathy.  Psychiatric/Behavioral: Negative for agitation, confusion, sleep disturbance and suicidal ideas. The patient is not nervous/anxious.     OBJECTIVE:   Vitals:  BP 120/80   Ht 5\' 4"  (1.626 m)   Wt 199 lb (90.3 kg)   LMP 05/09/2019 (Exact Date)   BMI 34.16 kg/m   Physical Exam Vitals signs reviewed.  Constitutional:      Appearance: She is well-developed.  Neck:  Musculoskeletal: Normal range of motion.  Pulmonary:     Effort: Pulmonary effort is normal.  Genitourinary:    General: Normal vulva.     Pubic Area: No rash.      Labia:        Right: No rash, tenderness or lesion.        Left: No rash, tenderness or lesion.      Vagina: Normal. No vaginal discharge, erythema or tenderness.     Cervix: Normal.     Uterus: Enlarged and tender.      Adnexa: Right adnexa normal and left adnexa normal.       Right: No mass or tenderness.         Left: No mass or tenderness.       Comments: IUD STRINGS IN CX OS; PESSARY CLEANED AND REPLACED; UTERUS FEELS ENLARGED, TENDER TO PT Musculoskeletal: Normal range of motion.  Skin:    General: Skin is warm and dry.  Neurological:     General: No focal deficit present.     Mental Status: She is alert and oriented to person, place, and time.  Psychiatric:        Mood and Affect: Mood normal.        Behavior: Behavior normal.        Thought Content: Thought content normal.        Judgment: Judgment normal.     Assessment/Plan: Menorrhagia with regular cycle - Plan: US PELVIS TRANSVAGINAL NON-OB (TV ONLY), Worsening flow in past 4 months. Check GYN u/s. If neg, could be decreased hormonal release of IUD at year 4. Discussed addition of POPs vs IUD replacement. Pt states IUD insertion was so painful that she couldn't do it again. Having pt see Dr. Kenton Kingfisher anyway for bladder surg for incont. If surg done, IUD could be replaced at that time. Will  check u/s first and go from there. Pt agrees with plan.  Encounter for routine checking of intrauterine contraceptive device (IUD) - Plan: US PELVIS TRANSVAGINAL NON-OB (TV ONLY),   Urinary incontinence in female - Plan: Refer to Dr. Kenton Kingfisher to discuss tx options/surg repair.  Pessary maintenance - Plan: Cleaned and replaced.     Return in about 1 day (around 05/17/2019) for GYN u/s for menorrhagia/IUD check--[facility] to call pt; appt with [facility] at later date for incontinence.  Ellia Knowlton B. Dayvon Dax, PA-C 05/16/2019 4:06 PM

## 2019-05-17 ENCOUNTER — Ambulatory Visit (INDEPENDENT_AMBULATORY_CARE_PROVIDER_SITE_OTHER): Payer: Managed Care, Other (non HMO)

## 2019-05-17 DIAGNOSIS — Z30431 Encounter for routine checking of intrauterine contraceptive device: Secondary | ICD-10-CM

## 2019-05-17 DIAGNOSIS — N920 Excessive and frequent menstruation with regular cycle: Secondary | ICD-10-CM | POA: Diagnosis not present

## 2019-05-17 DIAGNOSIS — D252 Subserosal leiomyoma of uterus: Secondary | ICD-10-CM

## 2019-05-18 NOTE — Progress Notes (Signed)
Pls let pt know u/s normal. IUD in right place, has 2 small fibroids. Has appt with Dr. Kenton Kingfisher 8/7 and can determine plan then. Thx

## 2019-05-18 NOTE — Progress Notes (Signed)
Pt aware of results, asked about the two fibroids if they needed to get taken care of soon, I told her I didn't know the answer to that question.

## 2019-05-25 ENCOUNTER — Ambulatory Visit (INDEPENDENT_AMBULATORY_CARE_PROVIDER_SITE_OTHER): Payer: Managed Care, Other (non HMO) | Admitting: Obstetrics & Gynecology

## 2019-05-25 ENCOUNTER — Encounter: Payer: Self-pay | Admitting: Obstetrics & Gynecology

## 2019-05-25 ENCOUNTER — Other Ambulatory Visit: Payer: Self-pay

## 2019-05-25 VITALS — BP 120/80 | Ht 64.0 in | Wt 200.0 lb

## 2019-05-25 DIAGNOSIS — N920 Excessive and frequent menstruation with regular cycle: Secondary | ICD-10-CM | POA: Diagnosis not present

## 2019-05-25 DIAGNOSIS — D219 Benign neoplasm of connective and other soft tissue, unspecified: Secondary | ICD-10-CM

## 2019-05-25 DIAGNOSIS — R32 Unspecified urinary incontinence: Secondary | ICD-10-CM | POA: Diagnosis not present

## 2019-05-25 NOTE — Patient Instructions (Signed)
Urethral Vaginal Sling  A urethral vaginal sling procedure is surgery to correct urinary incontinence. Urinary incontinence is passing urine without one's control. It is common in older women, and in women who have had children. In this surgery, a strong piece of material is placed under the tube that drains the bladder (urethra). This sling is made of tension-free vaginal tape or nylon mesh. It fits under the urethra like a hammock. The sling is put in position to straighten, support, and hold the urethra in its normal position. Tell a health care provider about:  Any allergies you have.  All medicines you are taking, including vitamins, herbs, eye drops, creams, and over-the-counter medicines.  Any problems you or family members have had with anesthetic medicines.  Any blood disorders you have.  Any surgeries you have had.  Any medical conditions you have.  Whether you are pregnant or may be pregnant. What are the risks? Generally, this is a safe procedure. However, problems may occur, including:  Infection.  Excessive bleeding.  Allergic reactions to medicines.  Damage to other structures or organs.  Problems urinating for several days or weeks.  Return of the urinary incontinence.  Mesh failure. Be sure to talk with your health care provider about the options you have for sling material and the risks associated with each material. What happens before the procedure? Staying hydrated Follow instructions from your health care provider about hydration, which may include:  Up to 2 hours before the procedure - you may continue to drink clear liquids, such as water, clear fruit juice, black coffee, and plain tea. Eating and drinking restrictions Follow instructions from your health care provider about eating and drinking, which may include:  8 hours before the procedure - stop eating heavy meals or foods such as meat, fried foods, or fatty foods.  6 hours before the procedure  - stop eating light meals or foods, such as toast or cereal.  6 hours before the procedure - stop drinking milk or drinks that contain milk.  2 hours before the procedure - stop drinking clear liquids. Medicines  Ask your health care provider about: ? Changing or stopping your regular medicines. This is especially important if you are taking diabetes medicines or blood thinners. ? Taking over-the-counter medicines, vitamins, herbs, and supplements. ? Taking medicines such as aspirin and ibuprofen. These medicines can thin your blood. Do not take these medicines unless your health care provider tells you to take them.  You may be given antibiotic medicine to help prevent infection. General instructions  Ask your health care provider how your surgical site will be marked or identified.  You may be asked to shower with a germ-killing soap.  Do not use any products that contain nicotine or tobacco, such as cigarettes and e-cigarettes, for 2 weeks before the surgery. If you need help quitting, ask your health care provider.  Plan to have someone take you home from the hospital or clinic. What happens during the procedure?  To reduce your risk of infection: ? Your health care team will wash or sanitize their hands. ? Hair may be removed from the surgical area. ? Your skin will be washed with soap.  An IV will be inserted into one of your veins.  You will be given one or both of the following: ? A medicine to make you fall asleep (general anesthetic). ? A medicine that is injected into your spine to numb the area below and slightly above the injection site (spinal  anesthetic).  A catheter will be placed in your bladder to drain urine during the procedure.  An incision will be made in your vagina and on the lower part of your abdomen.  The sling material will be passed around your bladder neck and stitched (sutured) to the muscles to hold the urethra in its normal position.  The  incisions will then be closed with sutures. The procedure may vary among health care providers and hospitals. What happens after the procedure?  Your blood pressure, heart rate, breathing rate, and blood oxygen level will be monitored until the medicines you were given have worn off.  You will have a catheter in place to drain your bladder. This will stay in place until your bladder is working properly on its own.  You may have a gauze packing in the vagina to prevent bleeding. This will be removed in 1-2 days. Summary  A urethral vaginal sling procedure is surgery to correct urinary incontinence.  In this surgery, a strong piece of material is placed under the urethra to hold it in its normal position.  Follow instructions from your health care provider about eating and drinking before the procedure.  After surgery, you will have a urinary catheter in place until your bladder works properly on its own again. This information is not intended to replace advice given to you by your health care provider. Make sure you discuss any questions you have with your health care provider. Document Released: 07/13/2008 Document Revised: 09/16/2017 Document Reviewed: 01/12/2017 Elsevier Patient Education  2020 [employer].   Total Laparoscopic Hysterectomy A total laparoscopic hysterectomy is a minimally invasive surgery to remove the uterus and cervix. The fallopian tubes and ovaries can also be removed (bilateral salpingo-oophorectomy) during this surgery, if necessary. This procedure may be done to treat problems such as:  Noncancerous growths in the uterus (uterine fibroids) that cause symptoms.  A condition that causes the lining of the uterus (endometrium) to grow in other areas (endometriosis).  Problems with pelvic support. This is caused by weakened muscles of the pelvis following vaginal childbirth or menopause.  Cancer of the cervix, ovaries, uterus, or endometrium.  Excessive  (dysfunctional) uterine bleeding. This surgery is performed by inserting a thin, lighted tube (laparoscope) and surgical instruments into small incisions in the abdomen. The laparoscope sends images to a monitor. The images help the health care provider perform the procedure. After this procedure, you will no longer be able to have a baby, and you will no longer have a menstrual period. Tell a health care provider about:  Any allergies you have.  All medicines you are taking, including vitamins, herbs, eye drops, creams, and over-the-counter medicines.  Any problems you or family members have had with anesthetic medicines.  Any blood disorders you have.  Any surgeries you have had.  Any medical conditions you have.  Whether you are pregnant or may be pregnant. What are the risks? Generally, this is a safe procedure. However, problems may occur, including:  Infection.  Bleeding.  Blood clots in the legs or lungs.  Allergic reactions to medicines.  Damage to other structures or organs.  The risk that the surgery may have to be switched to the regular one in which a large incision is made in the abdomen (abdominal hysterectomy). What happens before the procedure? Staying hydrated Follow instructions from your health care provider about hydration, which may include:  Up to 2 hours before the procedure - you may continue to drink clear liquids,  such as water, clear fruit juice, black coffee, and plain tea Eating and drinking restrictions Follow instructions from your health care provider about eating and drinking, which may include:  8 hours before the procedure - stop eating heavy meals or foods such as meat, fried foods, or fatty foods.  6 hours before the procedure - stop eating light meals or foods, such as toast or cereal.  6 hours before the procedure - stop drinking milk or drinks that contain milk.  2 hours before the procedure - stop drinking clear liquids. Medicines   Ask your health care provider about: ? Changing or stopping your regular medicines. This is especially important if you are taking diabetes medicines or blood thinners. ? Taking over-the-counter medicines, vitamins, herbs, and supplements. ? Taking medicines such as aspirin and ibuprofen. These medicines can thin your blood. Do not take these medicines unless your health care provider tells you to take them.  You may be given antibiotic medicine to help prevent infection.  You may be asked to take laxatives.  You may be given medicines to help prevent nausea and vomiting after the procedure. General instructions  Ask your health care provider how your surgical site will be marked or identified.  You may be asked to shower with a germ-killing soap.  Do not use any products that contain nicotine or tobacco, such as cigarettes and e-cigarettes. If you need help quitting, ask your health care provider.  You may have an exam or testing, such as an ultrasound to determine the size and shape of your pelvic organs.  You may have a blood or urine sample taken.  This procedure can affect the way you feel about yourself. Talk with your health care provider about the physical and emotional changes hysterectomy may cause.  Plan to have someone take you home from the hospital or clinic.  Plan to have a responsible adult care for you for at least 24 hours after you leave the hospital or clinic. This is important. What happens during the procedure?  To lower your risk of infection: ? Your health care team will wash or sanitize their hands. ? Your skin will be washed with soap. ? Hair may be removed from the surgical area.  An IV will be inserted into one of your veins.  You will be given one or more of the following: ? A medicine to help you relax (sedative). ? A medicine to make you fall asleep (general anesthetic).  You will be given antibiotic medicine through your IV.  A tube may be  inserted down your throat to help you breathe during the procedure.  A gas (carbon dioxide) will be used to inflate your abdomen to allow your surgeon to see inside of your abdomen.  Three or four small incisions will be made in your abdomen.  A laparoscope will be inserted into one of your incisions. Surgical instruments will be inserted through the other incisions in order to perform the procedure.  Your uterus and cervix may be removed through your vagina or cut into small pieces and removed through the small incisions. Any other organs that need to be removed will also be removed this way.  Carbon dioxide will be released from inside of your abdomen.  Your incisions will be closed with stitches (sutures).  A bandage (dressing) may be placed over your incisions. The procedure may vary among health care providers and hospitals. What happens after the procedure?  Your blood pressure, heart rate, breathing rate,  and blood oxygen level will be monitored until the medicines you were given have worn off.  You will be given medicine for pain and nausea as needed.  Do not drive for 24 hours if you received a sedative. Summary  Total Laparoscopic hysterectomy is a procedure to remove your uterus, cervix and sometimes the fallopian tubes and ovaries.  This procedure can affect the way you feel about yourself. Talk with your health care provider about the physical and emotional changes hysterectomy may cause.  After this procedure, you will no longer be able to have a baby, and you will no longer have a menstrual period.  You will be given pain medicine to control discomfort after this procedure. This information is not intended to replace advice given to you by your health care provider. Make sure you discuss any questions you have with your health care provider. Document Released: 08/01/2007 Document Revised: 09/16/2017 Document Reviewed: 12/15/2016 Elsevier Patient Education  2020  [employer].

## 2019-05-25 NOTE — Progress Notes (Signed)
Gynecology Evaluation   Chief Complaint: Leakage of urine and heavy periods  History of Present Illness:   Patient is a 53 y.o. G1P1001, presents today for a problem visit.  The patient is not menopausal. She complains of urinary symptoms of urinary incontinence. Patient reports her symptoms began several years ago and its severity is described as severe.  Her symptoms include incontinence.  She is having leakage of urine with stressful activities, such as coughing, sneezing, laughter, or exercise.  She is not having leakage of urine based on symptoms of urgency or bladder spasm/discomfort.  She is not having nocturia, with 1 episodes per night.  She is using pads or diapers daily for control of symptoms. She is not having dysuria.  She has not had a history of UTI.  She drinks about 2 caffeinated drinks per day.  She has had prior procedures for incontinence such as pessary placed 4 years ago w only partial control of sx's  She is sexually active and has been even w pessary in place.  She does not take out her own pessary nor does she want to.  Also, periods have been worsening this year with more flow, clots, heaviness, interfering w work, soiling of clothes.  Prior polyp excision 4 years ago w hysteroscopy.  No mention of fibroids then, but recent US shows 2 fibroids in uterus.  Also expresses concerns w family hisotry of uterine cancer (sister, MGM).  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 Also,  has a past surgical history that includes Polypectomy; Fracture surgery; Breast surgery; Colonoscopy with propofol (N/A, 02/24/2018); polypectomy (02/24/2018); and Breast biopsy (Right, 2012?)., family history includes Arthritis in her father and paternal grandmother; Asthma in her father; Cancer in her paternal grandmother; Early  death in her maternal grandfather; Heart attack in her maternal grandfather; Hypertension in her mother; Skin cancer in her father; Stroke in her paternal grandfather; Thyroid disease in her sister; Uterine cancer in her sister.,  reports that she has never smoked. She has never used smokeless tobacco. She reports current alcohol use of about 1.0 - 2.0 standard drinks of alcohol per week. She reports that she does not use drugs.  She has a current medication list which includes the following prescription(s): albuterol, alprazolam, amlodipine, bupropion, bupropion, citalopram, levonorgestrel, montelukast, and trazodone. Also, is allergic to codeine and penicillins.  Review of Systems  All other systems reviewed and are negative.   Objective: BP 120/80   Ht 5\' 4"  (1.626 m)   Wt 200 lb (90.7 kg)   LMP 05/09/2019 (Exact Date)   BMI 34.33 kg/m  Physical Exam Constitutional:      General: She is not in acute distress.    Appearance: She is well-developed.  Genitourinary:     Pelvic exam was performed with patient supine.     Vagina and cervix normal.     Vaginal prolapse present.     No vaginal erythema or bleeding.     No cervical polyp or nabothian cyst.     Uterus is enlarged, irregular and mobile.     No uterine mass detected.    Uterus is midaxial.     No right or left adnexal mass present.     Right adnexa not tender.     Left adnexa not tender.     Genitourinary Comments: Gr 2 Uterine prolapse, enlarged  uterus Gr 2 cystocele Gr 1 rectocele Pessary intact well positioned Urethral hypermobility, >30 degree qtip test NT exam  HENT:     Head: Normocephalic and atraumatic.     Nose: Nose normal.  Abdominal:     General: There is no distension.     Palpations: Abdomen is soft.     Tenderness: There is no abdominal tenderness.  Musculoskeletal: Normal range of motion.  Neurological:     Mental Status: She is alert and oriented to person, place, and time.     Cranial Nerves:  No cranial nerve deficit.  Skin:    General: Skin is warm and dry.    Female chaperone present for pelvic portion of the physical exam  [place]: Findings:   The uterus is anteverted and measures 8.2 x 4.0 x 3.6 cm. Echo texture is heterogenous with evidence of focal masses. Within the uterus are multiple suspected fibroids measuring: Fibroid 1:15 x 9 x 16 mm- subserosal posterior Fibroid 2:29 x 25 x 30 mm-subserosal fundal  The Endometrium measures 2.7 mm. The IUD is correctly placed within the uterus.   Right Ovary measures 2.1 x 1.1 x 2.1 cm . It is normal in appearance. Left Ovary measures 2.3 x 1.3 x 1.6  cm. It is normal in appearance.  There is a questionable cyst with a thin septation vs. Two ovarian follicles. The questionable cyst measures 21 x 13 x 15 mm. No blood flow is seen within.  Survey of the adnexa demonstrates no adnexal masses. There is no free fluid in the cul de sac.  Impression: 1. There are two subserosal fibroids seen.  2. The IUD is correctly placed within the uterus.  3. Normal appearing endometrium.  4. There is a questionable cyst with a thin septation vs. Two normal follicles seen in the left ovary.  5. Normal right ovary.    Assessment: 53 y.o. G1P1001 for urologic evaluation of incontinence 1. Menorrhagia with regular cycle Heavy cycles recently; new onset fibroids Treatment option for menorrhagia or menometrorrhagia discussed in great detail with the patient.  Options include hormonal therapy, IUD therapy such as Mirena, D&C, Ablation, and Hysterectomy.  The pros and cons of each option discussed with patient.  2. Urinary incontinence in female Stress incontinence is primary features.  Fibroids may contribute to this as well.  Chronic lifting in her history.  One NSVD. Options for hyst to remove bladder pressure and effects Options for Anterior repair w/wout sling discussed as well Continued pessary use discussed, vs surgery    May still need  future pessary use, although hopefully not for a long while Pros and cons of surgery discussed  3. Fibroids Fibroid treatment such as [place], Lupron, Myomectomy, and Hysterectomy discussed in detail, with the pros and cons of each choice counseled.  No treatment as an option also discussed, as well as control of symptoms alone with hormone therapy. Information provided to the patient.  Option of TVH (possible TLH), AR, Sling discussed and planned Recovery, including lifting restrictions and pelvis rest for 6 weeks, counseled  A total of 35 minutes were spent face-to-face with the patient during this encounter and over half of that time dealt with counseling and coordination of care.  Barnett Applebaum, MD, [facility], [facility] 05/25/2019  8:53 AM

## 2019-05-30 ENCOUNTER — Telehealth: Payer: Self-pay | Admitting: Obstetrics & Gynecology

## 2019-05-30 NOTE — Telephone Encounter (Signed)
-----   Message from Gae Dry, MD sent at 05/25/2019  8:53 AM EDT ----- Regarding: surgery August plz  Surgery Booking Request Patient Full Name:  Mackenzie Bailey  MRN: [id number]  DOB: January 24, 1966  Surgeon: Hoyt Koch, MD  Requested Surgery Date and Time: soon, any Primary Diagnosis AND Code:   1. Menorrhagia with regular cycle  N92.0  2. Urinary incontinence in female  R32  3. Fibroids  D21.9  Secondary Diagnosis and Code:  Surgical Procedure: TVH, AR, Pubovaginal sling, Cystoscopy, possible laparoscopy L&D Notification: No Admission Status: same day surgery Length of Surgery: 1.5 hr Special Case Needs: TVT Exact sling H&P: yes (date) Phone Interview???: yes Interpreter: Language:  Medical Clearance: no Special Scheduling Instructions: no Acuity: P3

## 2019-05-30 NOTE — Telephone Encounter (Signed)
I spoke to the patient regarding scheduling surgery. Patient needs an August date, if possible, due to insurance. Patient is aware that August dates are full, but blocks should release tomorrow for 8/27. I will check w/ OR after blocks release and contact patient. Patient is aware to call me tomorrow afternoon if she has not heard from me. Ext given.

## 2019-05-31 NOTE — Telephone Encounter (Signed)
Spoke to the patient to let her know the blocks are not yet released for 8/27, and that I will check again tomorrow.

## 2019-06-01 NOTE — Telephone Encounter (Signed)
Patient is aware of H&P on 06/08/19 @ 11:00am w/ Dr. Kenton Kingfisher, Pre-admit testing phone interview and covid testing to be scheduled, and OR on 06/14/19. Patient is aware she will be asked to quarantine after covid testing. Patient is aware she may receive calls from the [facility] and [facility].

## 2019-06-07 ENCOUNTER — Encounter
Admission: RE | Admit: 2019-06-07 | Discharge: 2019-06-07 | Disposition: A | Discharge status: Home / Self Care | Payer: Managed Care, Other (non HMO) | Source: Ambulatory Visit | Attending: Obstetrics & Gynecology | Admitting: Obstetrics & Gynecology

## 2019-06-07 ENCOUNTER — Other Ambulatory Visit: Payer: Self-pay

## 2019-06-07 HISTORY — DX: Nausea with vomiting, unspecified: R11.2

## 2019-06-07 HISTORY — DX: Headache, unspecified: R51.9

## 2019-06-07 HISTORY — DX: Other complications of anesthesia, initial encounter: T88.59XA

## 2019-06-07 HISTORY — DX: Gastro-esophageal reflux disease without esophagitis: K21.9

## 2019-06-07 HISTORY — DX: Other specified postprocedural states: Z98.890

## 2019-06-07 NOTE — Patient Instructions (Signed)
Your procedure is scheduled on: 06-14-19 THURSDAY Report to Same Day Surgery 2nd floor medical mall [facility] Entrance-take elevator on left to 2nd floor.  Check in with surgery information desk.) To find out your arrival time please call [phone] between 1PM - 3PM on 06-13-19 [facility]  Remember: Instructions that are not followed completely may result in serious medical risk, up to and including death, or upon the discretion of your surgeon and anesthesiologist your surgery may need to be rescheduled.    _x___ 1. Do not eat food after midnight the night before your procedure. NO GUM OR CANDY AFTER MIDNIGHT. You may drink clear liquids up to 2 hours before you are scheduled to arrive at the hospital for your procedure.  Do not drink clear liquids within 2 hours of your scheduled arrival to the hospital.  Clear liquids include  --Water or Apple juice without pulp  --Clear carbohydrate beverage such as ClearFast or Gatorade  --Black Coffee or Clear Tea (No milk, no creamers, do not add anything to the coffee or Tea   ____Ensure clear carbohydrate drink on the way to the hospital for bariatric patients  ____Ensure clear carbohydrate drink 3 hours before surgery.     __x__ 2. No Alcohol for 24 hours before or after surgery.   __x__3. No Smoking or e-cigarettes for 24 prior to surgery.  Do not use any chewable tobacco products for at least 6 hour prior to surgery   ____  4. Bring all medications with you on the day of surgery if instructed.    __x__ 5. Notify your doctor if there is any change in your medical condition     (cold, fever, infections).    x___6. On the morning of surgery brush your teeth with toothpaste and water.  You may rinse your mouth with mouth wash if you wish.  Do not swallow any toothpaste or mouthwash.   Do not wear jewelry, make-up, hairpins, clips or nail polish.  Do not wear lotions, powders, or perfumes. You may wear deodorant.  Do not shave 48 hours  prior to surgery. Men may shave face and neck.  Do not bring valuables to the hospital.    [facility] is not responsible for any belongings or valuables.               Contacts, dentures or bridgework may not be worn into surgery.  Leave your suitcase in the car. After surgery it may be brought to your room.  For patients admitted to the hospital, discharge time is determined by your treatment team.  _  Patients discharged the day of surgery will not be allowed to drive home.  You will need someone to drive you home and stay with you the night of your procedure.    Please read over the following fact sheets that you were given:   [facility] Preparing for Surgery   _x___ TAKE THE FOLLOWING MEDICATION THE MORNING OF SURGERY WITH A SMALL SIP OF WATER. These include:  1. AMLODIPINE (NORVASC)  2. WELLBUTRIN (BUPROPION)  3. PRILOSEC (OMEPRAZOLE)  4. TAKE AN OMEPRAZOLE THE NIGHT BEFORE YOUR SURGERY  5. YOU MAY TAKE XANAX IF NEEDED AM OF SURGERY  6.  ____Fleets enema or Magnesium Citrate as directed.   ____ Use CHG Soap or sage wipes as directed on instruction sheet   ____ Use inhalers on the day of surgery and bring to hospital day of surgery  ____ Stop Metformin and Janumet 2 days prior to  surgery.    ____ Take 1/2 of usual insulin dose the night before surgery and none on the morning surgery.   ____ Follow recommendations from Cardiologist, Pulmonologist or PCP regarding stopping Aspirin, Coumadin, Plavix ,Eliquis, Effient, or Pradaxa, and Pletal.  X____Stop Anti-inflammatories such as Advil, Aleve, Ibuprofen, Motrin, Naproxen, Naprosyn, Goodies powders or aspirin products NOW-OK to take Tylenol    ____ Stop supplements until after surgery.     ____ Bring C-Pap to the hospital.

## 2019-06-08 ENCOUNTER — Encounter: Payer: Self-pay | Admitting: Obstetrics & Gynecology

## 2019-06-08 ENCOUNTER — Other Ambulatory Visit: Payer: Self-pay

## 2019-06-08 ENCOUNTER — Ambulatory Visit (INDEPENDENT_AMBULATORY_CARE_PROVIDER_SITE_OTHER): Payer: Managed Care, Other (non HMO) | Admitting: Obstetrics & Gynecology

## 2019-06-08 VITALS — BP 120/80 | Ht 64.0 in | Wt 196.0 lb

## 2019-06-08 DIAGNOSIS — N920 Excessive and frequent menstruation with regular cycle: Secondary | ICD-10-CM | POA: Diagnosis not present

## 2019-06-08 DIAGNOSIS — D219 Benign neoplasm of connective and other soft tissue, unspecified: Secondary | ICD-10-CM

## 2019-06-08 DIAGNOSIS — R32 Unspecified urinary incontinence: Secondary | ICD-10-CM

## 2019-06-08 NOTE — Patient Instructions (Signed)
Vaginal Hysterectomy, Care After °Refer to this sheet in the next few weeks. These instructions provide you with information about caring for yourself after your procedure. Your health care provider may also give you more specific instructions. Your treatment has been planned according to current medical practices, but problems sometimes occur. Call your health care provider if you have any problems or questions after your procedure. °What can I expect after the procedure? °After the procedure, it is common to have: °· Pain. °· Soreness and numbness in your incision areas. °· Vaginal bleeding and discharge. °· Constipation. °· Temporary problems emptying the bladder. °· Feelings of sadness or other emotions. °Follow these instructions at home: °Medicines °· Take over-the-counter and prescription medicines only as told by your health care provider. °· If you were prescribed an antibiotic medicine, take it as told by your health care provider. Do not stop taking the antibiotic even if you start to feel better. °· Do not drive or operate heavy machinery while taking prescription pain medicine. °Activity °· Return to your normal activities as told by your health care provider. Ask your health care provider what activities are safe for you. °· Get regular exercise as told by your health care provider. You may be told to take short walks every day and go farther each time. °· Do not lift anything that is heavier than 10 lb (4.5 kg). °General instructions ° °· Do not put anything in your vagina for 6 weeks after your surgery or as told by your health care provider. This includes tampons and douches. °· Do not have sex until your health care provider says you can. °· Do not take baths, swim, or use a hot tub until your health care provider approves. °· Drink enough fluid to keep your urine clear or pale yellow. °· Do not drive for 24 hours if you were given a sedative. °· Keep all follow-up visits as told by your health  care provider. This is important. °Contact a health care provider if: °· Your pain medicine is not helping. °· You have a fever. °· You have redness, swelling, or pain at your incision site. °· You have blood, pus, or a bad-smelling discharge from your vagina. °· You continue to have difficulty urinating. °Get help right away if: °· You have severe abdominal or back pain. °· You have heavy bleeding from your vagina. °· You have chest pain or shortness of breath. °This information is not intended to replace advice given to you by your health care provider. Make sure you discuss any questions you have with your health care provider. °Document Released: 01/26/2016 Document Revised: 05/27/2016 Document Reviewed: 10/19/2015 °Elsevier Patient Education © 2020 [employer] Inc[employer] ° °

## 2019-06-08 NOTE — H&P (View-Only) (Signed)
PRE-OPERATIVE HISTORY AND PHYSICAL EXAM  HPI:  Mackenzie Bailey is a 53 y.o. G1P1001 Patient's last menstrual period was 05/09/2019 (exact date).; she is being admitted for surgery related to bleeding, fibroids, and incontinence. She complains of urinary symptoms of urinary incontinence. Patient reports her symptoms began several years ago and its severity is described as severe.  She is having leakage of urine with stressful activities, such as coughing, sneezing, laughter, or exercise.  She is not having leakage of urine based on symptoms of urgency or bladder spasm/discomfort.  She is not having nocturia, with 1 episodes per night.  She is using pads or diapers daily for control of symptoms. She is not having dysuria.  She has not had a history of UTI.  She drinks about 2 caffeinated drinks per day.  She has had prior procedures for incontinence such as pessary placed 4 years ago w only partial control of sx's  She is sexually active and has been even w pessary in place.  She does not take out her own pessary nor does she want to.  Also, periods have been worsening this year with more flow, clots, heaviness, interfering w work, soiling of clothes.  Prior polyp excision 4 years ago w hysteroscopy.  No mention of fibroids then, but recent US shows 2 fibroids in uterus.  Also expresses concerns w family hisotry of uterine cancer (sister, MGM).  PMHx: Past Medical History:  Diagnosis Date  . Allergy   . Anxiety   . Asthma    SEASONAL   . Complication of anesthesia   . Female cystocele   . GERD (gastroesophageal reflux disease)    OCC  . Headache    RARE MIGRAINES  . Heart murmur    ASYMPTOMATIC  . Hypertension   . Menorrhagia   . Mitral valve prolapse   . PONV (postoperative nausea and vomiting)    WITH FRACTURE SURGERY ONLY  . SUI (stress urinary incontinence, female)   . Vaginal pessary present    Past Surgical History:  Procedure Laterality Date  . BREAST BIOPSY Right  2012?   benign  . BREAST SURGERY     benign biobsy  . COLONOSCOPY WITH PROPOFOL N/A 02/24/2018   Procedure: COLONOSCOPY WITH PROPOFOL;  Surgeon: Lucilla Lame, MD;  Location: [facility];  Service: Endoscopy;  Laterality: N/A;  . FRACTURE SURGERY     R lower leg  . POLYPECTOMY     uterine, found on sonohystogram, contributed to menorrhagia   . POLYPECTOMY  02/24/2018   Procedure: POLYPECTOMY;  Surgeon: Lucilla Lame, MD;  Location: [facility];  Service: Endoscopy;;   Family History  Problem Relation Age of Onset  . Arthritis Father   . Asthma Father   . Skin cancer Father        melanoma  . Thyroid disease Sister   . Uterine cancer Sister   . Heart attack Maternal Grandfather   . Early death Maternal Grandfather        massive heart attack  . Arthritis Paternal Grandmother   . Cancer Paternal Grandmother        uterine  . Stroke Paternal Grandfather   . Hypertension Mother   . Colon cancer Neg Hx   . Breast cancer Neg Hx    Social History   Tobacco Use  . Smoking status: Never Smoker  . Smokeless tobacco: Never Used  Substance Use Topics  . Alcohol use: Yes    Alcohol/week: 1.0 - 2.0  standard drinks    Types: 1 - 2 Glasses of wine per week    Comment: [place]  . Drug use: No    Current Outpatient Medications:  .  albuterol (PROVENTIL) (2.5 MG/3ML) 0.083% nebulizer solution, Take 3 mLs (2.5 mg total) by nebulization every 6 (six) hours as needed for wheezing or shortness of breath., Disp: 75 mL, Rfl: 12 .  ALPRAZolam (XANAX) 1 MG tablet, Take 1 tablet (1 mg total) by mouth daily as needed for anxiety., Disp: 30 tablet, Rfl: 0 .  amLODipine (NORVASC) 5 MG tablet, Take 1 tablet (5 mg total) by mouth daily. (Patient taking differently: Take 5 mg by mouth every morning. ), Disp: 90 tablet, Rfl: 3 .  aspirin-acetaminophen-caffeine (EXCEDRIN MIGRAINE) 250-250-65 MG tablet, Take 2 tablets by mouth every 6 (six) hours as needed for headache., Disp: , Rfl:  .   buPROPion (WELLBUTRIN XL) 150 MG 24 hr tablet, TAKE 1 TABLET BY MOUTH EVERY DAY (Patient not taking: Reported on 06/01/2019), Disp: 90 tablet, Rfl: 1 .  buPROPion (WELLBUTRIN) 75 MG tablet, Take 75 mg by mouth 2 (two) times daily., Disp: , Rfl:  .  Cholecalciferol (VITAMIN D) 50 MCG (2000 UT) tablet, Take 2,000 Units by mouth daily., Disp: , Rfl:  .  citalopram (CELEXA) 40 MG tablet, Take 1 tablet (40 mg total) by mouth daily. (Patient taking differently: Take 40 mg by mouth every evening. ), Disp: 90 tablet, Rfl: 3 .  diphenhydramine-acetaminophen (TYLENOL PM) 25-500 MG TABS tablet, Take 1-2 tablets by mouth at bedtime as needed (sleep)., Disp: , Rfl:  .  ibuprofen (ADVIL) 200 MG tablet, Take 400 mg by mouth every 6 (six) hours as needed for headache or moderate pain., Disp: , Rfl:  .  levonorgestrel (MIRENA) 20 MCG/24HR IUD, 1 each by Intrauterine route once., Disp: , Rfl:  .  montelukast (SINGULAIR) 10 MG tablet, Take 1 tablet (10 mg total) by mouth at bedtime., Disp: 90 tablet, Rfl: 3 .  omeprazole (PRILOSEC) 20 MG capsule, Take 20 mg by mouth as needed., Disp: , Rfl:  .  Pseudoeph-Doxylamine-DM-APAP (NYQUIL PO), Take 1 Dose by mouth at bedtime as needed (sleep)., Disp: , Rfl:  .  traZODone (DESYREL) 50 MG tablet, TAKE 0.5-1 TABLETS (25-50 MG TOTAL) BY MOUTH AT BEDTIME AS NEEDED FOR SLEEP., Disp: 90 tablet, Rfl: 2 Allergies: Codeine and Penicillins  Review of Systems  Constitutional: Negative for chills, fever and malaise/fatigue.  HENT: Negative for congestion, sinus pain and sore throat.   Eyes: Negative for blurred vision and pain.  Respiratory: Negative for cough and wheezing.   Cardiovascular: Negative for chest pain and leg swelling.  Gastrointestinal: Negative for abdominal pain, constipation, diarrhea, heartburn, nausea and vomiting.  Genitourinary: Negative for dysuria, frequency, hematuria and urgency.  Musculoskeletal: Negative for back pain, joint pain, myalgias and neck pain.   Skin: Negative for itching and rash.  Neurological: Negative for dizziness, tremors and weakness.  Endo/Heme/Allergies: Does not bruise/bleed easily.  Psychiatric/Behavioral: Negative for depression. The patient is not nervous/anxious and does not have insomnia.     Objective: LMP 05/09/2019 (Exact Date)  There were no vitals filed for this visit. Physical Exam Constitutional:      General: She is not in acute distress.    Appearance: She is well-developed.  HENT:     Head: Normocephalic and atraumatic. No laceration.     Right Ear: Hearing normal.     Left Ear: Hearing normal.     Mouth/Throat:  Pharynx: Uvula midline.  Eyes:     Pupils: Pupils are equal, round, and reactive to light.  Neck:     Musculoskeletal: Normal range of motion and neck supple.     Thyroid: No thyromegaly.  Cardiovascular:     Rate and Rhythm: Normal rate and regular rhythm.     Heart sounds: No murmur. No friction rub. No gallop.   Pulmonary:     Effort: Pulmonary effort is normal. No respiratory distress.     Breath sounds: Normal breath sounds. No wheezing.  Chest:     Breasts:        Right: No mass, skin change or tenderness.        Left: No mass, skin change or tenderness.  Abdominal:     General: Bowel sounds are normal. There is no distension.     Palpations: Abdomen is soft.     Tenderness: There is no abdominal tenderness. There is no rebound.  Musculoskeletal: Normal range of motion.  Neurological:     Mental Status: She is alert and oriented to person, place, and time.     Cranial Nerves: No cranial nerve deficit.  Skin:    General: Skin is warm and dry.  Psychiatric:        Judgment: Judgment normal.  Vitals signs reviewed.    Assessment: 1. Menorrhagia with regular cycle   2. Fibroids   3. Urinary incontinence in female   Plan TVH (possible w laparoscopy assistance) along with salpingectomy and anterior colporrhaphy with pubovaginal sling and cystoscopy Alternatives  discussed as pessary and other surgical options Preservation of ovaries discussed as well IUD removal and pessary removal (return to pt) will also be done at time of surgery  I have had a careful discussion with this patient about all the options available and the risk/benefits of each. I have fully informed this patient that surgery may subject her to a variety of discomforts and risks: She understands that most patients have surgery with little difficulty, but problems can happen ranging from minor to fatal. These include nausea, vomiting, pain, bleeding, infection, poor healing, hernia, or formation of adhesions. Unexpected reactions may occur from any drug or anesthetic given. Unintended injury may occur to other pelvic or abdominal structures such as Fallopian tubes, ovaries, bladder, ureter (tube from kidney to bladder), or bowel. Nerves going from the pelvis to the legs may be injured. Any such injury may require immediate or later additional surgery to correct the problem. Excessive blood loss requiring transfusion is very unlikely but possible. Dangerous blood clots may form in the legs or lungs. Physical and sexual activity will be restricted in varying degrees for an indeterminate period of time but most often 2-6 weeks.  Finally, she understands that it is impossible to list every possible undesirable effect and that the condition for which surgery is done is not always cured or significantly improved, and in rare cases may be even worse.Ample time was given to answer all questions.  Barnett Applebaum, MD, [facility], [facility] 06/08/2019  10:04 AM

## 2019-06-08 NOTE — Progress Notes (Signed)
PRE-OPERATIVE HISTORY AND PHYSICAL EXAM  HPI:  Mackenzie Bailey is a 53 y.o. G1P1001 Patient's last menstrual period was 05/09/2019 (exact date).; she is being admitted for surgery related to bleeding, fibroids, and incontinence. She complains of urinary symptoms of urinary incontinence. Patient reports her symptoms began several years ago and its severity is described as severe.  She is having leakage of urine with stressful activities, such as coughing, sneezing, laughter, or exercise.  She is not having leakage of urine based on symptoms of urgency or bladder spasm/discomfort.  She is not having nocturia, with 1 episodes per night.  She is using pads or diapers daily for control of symptoms. She is not having dysuria.  She has not had a history of UTI.  She drinks about 2 caffeinated drinks per day.  She has had prior procedures for incontinence such as pessary placed 4 years ago w only partial control of sx's  She is sexually active and has been even w pessary in place.  She does not take out her own pessary nor does she want to.  Also, periods have been worsening this year with more flow, clots, heaviness, interfering w work, soiling of clothes.  Prior polyp excision 4 years ago w hysteroscopy.  No mention of fibroids then, but recent US shows 2 fibroids in uterus.  Also expresses concerns w family hisotry of uterine cancer (sister, MGM).  PMHx: Past Medical History:  Diagnosis Date  . Allergy   . Anxiety   . Asthma    SEASONAL   . Complication of anesthesia   . Female cystocele   . GERD (gastroesophageal reflux disease)    OCC  . Headache    RARE MIGRAINES  . Heart murmur    ASYMPTOMATIC  . Hypertension   . Menorrhagia   . Mitral valve prolapse   . PONV (postoperative nausea and vomiting)    WITH FRACTURE SURGERY ONLY  . SUI (stress urinary incontinence, female)   . Vaginal pessary present    Past Surgical History:  Procedure Laterality Date  . BREAST BIOPSY Right  2012?   benign  . BREAST SURGERY     benign biobsy  . COLONOSCOPY WITH PROPOFOL N/A 02/24/2018   Procedure: COLONOSCOPY WITH PROPOFOL;  Surgeon: Lucilla Lame, MD;  Location: [facility];  Service: Endoscopy;  Laterality: N/A;  . FRACTURE SURGERY     R lower leg  . POLYPECTOMY     uterine, found on sonohystogram, contributed to menorrhagia   . POLYPECTOMY  02/24/2018   Procedure: POLYPECTOMY;  Surgeon: Lucilla Lame, MD;  Location: [facility];  Service: Endoscopy;;   Family History  Problem Relation Age of Onset  . Arthritis Father   . Asthma Father   . Skin cancer Father        melanoma  . Thyroid disease Sister   . Uterine cancer Sister   . Heart attack Maternal Grandfather   . Early death Maternal Grandfather        massive heart attack  . Arthritis Paternal Grandmother   . Cancer Paternal Grandmother        uterine  . Stroke Paternal Grandfather   . Hypertension Mother   . Colon cancer Neg Hx   . Breast cancer Neg Hx    Social History   Tobacco Use  . Smoking status: Never Smoker  . Smokeless tobacco: Never Used  Substance Use Topics  . Alcohol use: Yes    Alcohol/week: 1.0 - 2.0  standard drinks    Types: 1 - 2 Glasses of wine per week    Comment: [place]  . Drug use: No    Current Outpatient Medications:  .  albuterol (PROVENTIL) (2.5 MG/3ML) 0.083% nebulizer solution, Take 3 mLs (2.5 mg total) by nebulization every 6 (six) hours as needed for wheezing or shortness of breath., Disp: 75 mL, Rfl: 12 .  ALPRAZolam (XANAX) 1 MG tablet, Take 1 tablet (1 mg total) by mouth daily as needed for anxiety., Disp: 30 tablet, Rfl: 0 .  amLODipine (NORVASC) 5 MG tablet, Take 1 tablet (5 mg total) by mouth daily. (Patient taking differently: Take 5 mg by mouth every morning. ), Disp: 90 tablet, Rfl: 3 .  aspirin-acetaminophen-caffeine (EXCEDRIN MIGRAINE) 250-250-65 MG tablet, Take 2 tablets by mouth every 6 (six) hours as needed for headache., Disp: , Rfl:  .   buPROPion (WELLBUTRIN XL) 150 MG 24 hr tablet, TAKE 1 TABLET BY MOUTH EVERY DAY (Patient not taking: Reported on 06/01/2019), Disp: 90 tablet, Rfl: 1 .  buPROPion (WELLBUTRIN) 75 MG tablet, Take 75 mg by mouth 2 (two) times daily., Disp: , Rfl:  .  Cholecalciferol (VITAMIN D) 50 MCG (2000 UT) tablet, Take 2,000 Units by mouth daily., Disp: , Rfl:  .  citalopram (CELEXA) 40 MG tablet, Take 1 tablet (40 mg total) by mouth daily. (Patient taking differently: Take 40 mg by mouth every evening. ), Disp: 90 tablet, Rfl: 3 .  diphenhydramine-acetaminophen (TYLENOL PM) 25-500 MG TABS tablet, Take 1-2 tablets by mouth at bedtime as needed (sleep)., Disp: , Rfl:  .  ibuprofen (ADVIL) 200 MG tablet, Take 400 mg by mouth every 6 (six) hours as needed for headache or moderate pain., Disp: , Rfl:  .  levonorgestrel (MIRENA) 20 MCG/24HR IUD, 1 each by Intrauterine route once., Disp: , Rfl:  .  montelukast (SINGULAIR) 10 MG tablet, Take 1 tablet (10 mg total) by mouth at bedtime., Disp: 90 tablet, Rfl: 3 .  omeprazole (PRILOSEC) 20 MG capsule, Take 20 mg by mouth as needed., Disp: , Rfl:  .  Pseudoeph-Doxylamine-DM-APAP (NYQUIL PO), Take 1 Dose by mouth at bedtime as needed (sleep)., Disp: , Rfl:  .  traZODone (DESYREL) 50 MG tablet, TAKE 0.5-1 TABLETS (25-50 MG TOTAL) BY MOUTH AT BEDTIME AS NEEDED FOR SLEEP., Disp: 90 tablet, Rfl: 2 Allergies: Codeine and Penicillins  Review of Systems  Constitutional: Negative for chills, fever and malaise/fatigue.  HENT: Negative for congestion, sinus pain and sore throat.   Eyes: Negative for blurred vision and pain.  Respiratory: Negative for cough and wheezing.   Cardiovascular: Negative for chest pain and leg swelling.  Gastrointestinal: Negative for abdominal pain, constipation, diarrhea, heartburn, nausea and vomiting.  Genitourinary: Negative for dysuria, frequency, hematuria and urgency.  Musculoskeletal: Negative for back pain, joint pain, myalgias and neck pain.   Skin: Negative for itching and rash.  Neurological: Negative for dizziness, tremors and weakness.  Endo/Heme/Allergies: Does not bruise/bleed easily.  Psychiatric/Behavioral: Negative for depression. The patient is not nervous/anxious and does not have insomnia.     Objective: LMP 05/09/2019 (Exact Date)  There were no vitals filed for this visit. Physical Exam Constitutional:      General: She is not in acute distress.    Appearance: She is well-developed.  HENT:     Head: Normocephalic and atraumatic. No laceration.     Right Ear: Hearing normal.     Left Ear: Hearing normal.     Mouth/Throat:  Pharynx: Uvula midline.  Eyes:     Pupils: Pupils are equal, round, and reactive to light.  Neck:     Musculoskeletal: Normal range of motion and neck supple.     Thyroid: No thyromegaly.  Cardiovascular:     Rate and Rhythm: Normal rate and regular rhythm.     Heart sounds: No murmur. No friction rub. No gallop.   Pulmonary:     Effort: Pulmonary effort is normal. No respiratory distress.     Breath sounds: Normal breath sounds. No wheezing.  Chest:     Breasts:        Right: No mass, skin change or tenderness.        Left: No mass, skin change or tenderness.  Abdominal:     General: Bowel sounds are normal. There is no distension.     Palpations: Abdomen is soft.     Tenderness: There is no abdominal tenderness. There is no rebound.  Musculoskeletal: Normal range of motion.  Neurological:     Mental Status: She is alert and oriented to person, place, and time.     Cranial Nerves: No cranial nerve deficit.  Skin:    General: Skin is warm and dry.  Psychiatric:        Judgment: Judgment normal.  Vitals signs reviewed.    Assessment: 1. Menorrhagia with regular cycle   2. Fibroids   3. Urinary incontinence in female   Plan TVH (possible w laparoscopy assistance) along with salpingectomy and anterior colporrhaphy with pubovaginal sling and cystoscopy Alternatives  discussed as pessary and other surgical options Preservation of ovaries discussed as well IUD removal and pessary removal (return to pt) will also be done at time of surgery  I have had a careful discussion with this patient about all the options available and the risk/benefits of each. I have fully informed this patient that surgery may subject her to a variety of discomforts and risks: She understands that most patients have surgery with little difficulty, but problems can happen ranging from minor to fatal. These include nausea, vomiting, pain, bleeding, infection, poor healing, hernia, or formation of adhesions. Unexpected reactions may occur from any drug or anesthetic given. Unintended injury may occur to other pelvic or abdominal structures such as Fallopian tubes, ovaries, bladder, ureter (tube from kidney to bladder), or bowel. Nerves going from the pelvis to the legs may be injured. Any such injury may require immediate or later additional surgery to correct the problem. Excessive blood loss requiring transfusion is very unlikely but possible. Dangerous blood clots may form in the legs or lungs. Physical and sexual activity will be restricted in varying degrees for an indeterminate period of time but most often 2-6 weeks.  Finally, she understands that it is impossible to list every possible undesirable effect and that the condition for which surgery is done is not always cured or significantly improved, and in rare cases may be even worse.Ample time was given to answer all questions.  Barnett Applebaum, MD, [facility], [facility] 06/08/2019  10:04 AM

## 2019-06-11 ENCOUNTER — Encounter: Payer: Self-pay | Admitting: Anesthesiology

## 2019-06-11 ENCOUNTER — Telehealth: Payer: Self-pay | Admitting: Family Medicine

## 2019-06-11 ENCOUNTER — Other Ambulatory Visit: Admission: RE | Admit: 2019-06-11 | Payer: Managed Care, Other (non HMO) | Source: Ambulatory Visit

## 2019-06-11 ENCOUNTER — Encounter
Admission: RE | Admit: 2019-06-11 | Discharge: 2019-06-11 | Disposition: A | Discharge status: Home / Self Care | Payer: Managed Care, Other (non HMO) | Source: Ambulatory Visit | Attending: Obstetrics & Gynecology | Admitting: Obstetrics & Gynecology

## 2019-06-11 ENCOUNTER — Other Ambulatory Visit: Payer: Self-pay

## 2019-06-11 ENCOUNTER — Telehealth: Payer: Self-pay | Admitting: Obstetrics & Gynecology

## 2019-06-11 DIAGNOSIS — N920 Excessive and frequent menstruation with regular cycle: Secondary | ICD-10-CM | POA: Insufficient documentation

## 2019-06-11 DIAGNOSIS — Z20828 Contact with and (suspected) exposure to other viral communicable diseases: Secondary | ICD-10-CM | POA: Insufficient documentation

## 2019-06-11 DIAGNOSIS — J45909 Unspecified asthma, uncomplicated: Secondary | ICD-10-CM | POA: Diagnosis not present

## 2019-06-11 DIAGNOSIS — D219 Benign neoplasm of connective and other soft tissue, unspecified: Secondary | ICD-10-CM | POA: Insufficient documentation

## 2019-06-11 DIAGNOSIS — R32 Unspecified urinary incontinence: Secondary | ICD-10-CM | POA: Insufficient documentation

## 2019-06-11 DIAGNOSIS — Z793 Long term (current) use of hormonal contraceptives: Secondary | ICD-10-CM | POA: Diagnosis not present

## 2019-06-11 DIAGNOSIS — K219 Gastro-esophageal reflux disease without esophagitis: Secondary | ICD-10-CM | POA: Diagnosis not present

## 2019-06-11 DIAGNOSIS — Z01818 Encounter for other preprocedural examination: Secondary | ICD-10-CM | POA: Insufficient documentation

## 2019-06-11 DIAGNOSIS — I10 Essential (primary) hypertension: Secondary | ICD-10-CM | POA: Diagnosis not present

## 2019-06-11 DIAGNOSIS — Z79899 Other long term (current) drug therapy: Secondary | ICD-10-CM | POA: Diagnosis not present

## 2019-06-11 LAB — CBC
HCT: 41.2 % (ref 36.0–46.0)
Hemoglobin: 13 g/dL (ref 12.0–15.0)
MCH: 25.4 pg — ABNORMAL LOW (ref 26.0–34.0)
MCHC: 31.6 g/dL (ref 30.0–36.0)
MCV: 80.6 fL (ref 80.0–100.0)
Platelets: 288 10*3/uL (ref 150–400)
RBC: 5.11 MIL/uL (ref 3.87–5.11)
RDW: 15.3 % (ref 11.5–15.5)
WBC: 7.2 10*3/uL (ref 4.0–10.5)
nRBC: 0 % (ref 0.0–0.2)

## 2019-06-11 LAB — BASIC METABOLIC PANEL
Anion gap: 9 (ref 5–15)
BUN: 12 mg/dL (ref 6–20)
CO2: 25 mmol/L (ref 22–32)
Calcium: 9.1 mg/dL (ref 8.9–10.3)
Chloride: 105 mmol/L (ref 98–111)
Creatinine, Ser: 0.64 mg/dL (ref 0.44–1.00)
GFR calc Af Amer: >60 mL/min (ref >60)
GFR calc non Af Amer: >60 mL/min (ref >60)
Glucose, Bld: 98 mg/dL (ref 70–99)
Potassium: 3.5 mmol/L (ref 3.5–5.1)
Sodium: 139 mmol/L (ref 135–145)

## 2019-06-11 LAB — PROTIME-INR
INR: 1 (ref 0.8–1.2)
Prothrombin Time: 13.4 seconds (ref 11.4–15.2)

## 2019-06-11 LAB — SARS CORONAVIRUS 2 (TAT 6-24 HRS): SARS Coronavirus 2: NEGATIVE

## 2019-06-11 LAB — APTT: aPTT: 30 seconds (ref 24–36)

## 2019-06-11 NOTE — Telephone Encounter (Signed)
Pt calling regarding the EKG [facility] sent over for Dr. B to sign off on.  Pt wants to know if it can be signed off on.  Thanks, [employer]

## 2019-06-11 NOTE — Pre-Procedure Instructions (Signed)
Messaged dr Rosey Bath regarding abnormal ekg. None for comparison in Epic or Care Everywhere. Dr Rosey Bath wanting medical clearance. Faxed this to [facility] and also messaged her regarding this clearance. Also faxed this clearance to pts PCP

## 2019-06-11 NOTE — Telephone Encounter (Signed)
Per patient, she will call Dr. Nancy Nordmann office to see if she can be cleared, if she needs to be seen, or if she needs to see cardiology. Per patient, the same thing happened prior to a surgery in [place], and she had to be cleared. Patient will call back if she needs assistance w/ appointment. Ext given.

## 2019-06-12 ENCOUNTER — Telehealth: Payer: Self-pay

## 2019-06-12 NOTE — Telephone Encounter (Signed)
Patient advised.

## 2019-06-12 NOTE — Telephone Encounter (Signed)
Patient called requesting that EKG report be faxed to Pre-admit and [facility]. EKG faxed.

## 2019-06-12 NOTE — Telephone Encounter (Signed)
This was signed off on and can be faxed back.  Please let patient know that I cleared her for surgery

## 2019-06-13 ENCOUNTER — Other Ambulatory Visit: Payer: Self-pay | Admitting: Family Medicine

## 2019-06-13 LAB — TYPE AND SCREEN
ABO/RH(D): A NEG
Antibody Screen: NEGATIVE

## 2019-06-13 MED ORDER — GENTAMICIN SULFATE 40 MG/ML IJ SOLN
5.0000 mg/kg | INTRAVENOUS | Status: DC
  Filled 2019-06-13: qty 11.25

## 2019-06-13 MED ORDER — METRONIDAZOLE IN NACL 5-0.79 MG/ML-% IV SOLN
500.0000 mg | INTRAVENOUS | Status: AC
  Administered 2019-06-14: 13:00:00 500 mg via INTRAVENOUS
  Filled 2019-06-13: qty 100

## 2019-06-13 MED ORDER — METRONIDAZOLE IN NACL 5-0.79 MG/ML-% IV SOLN
500.0000 mg | INTRAVENOUS | Status: DC
  Filled 2019-06-13: qty 100

## 2019-06-13 MED ORDER — GENTAMICIN SULFATE 40 MG/ML IJ SOLN
5.0000 mg/kg | INTRAVENOUS | Status: AC
  Administered 2019-06-14: 13:00:00 340 mg via INTRAVENOUS
  Filled 2019-06-13: qty 8.5

## 2019-06-13 NOTE — Pre-Procedure Instructions (Signed)
Medical clearance on chart- low risk

## 2019-06-14 ENCOUNTER — Other Ambulatory Visit: Payer: Self-pay | Admitting: Obstetrics & Gynecology

## 2019-06-14 ENCOUNTER — Encounter
Admission: RE | Disposition: A | Discharge status: Home / Self Care | Payer: Self-pay | Source: Home / Self Care | Attending: Obstetrics & Gynecology

## 2019-06-14 ENCOUNTER — Ambulatory Visit: Payer: Managed Care, Other (non HMO) | Admitting: Anesthesiology

## 2019-06-14 ENCOUNTER — Observation Stay
Admission: RE | Admit: 2019-06-14 | Discharge: 2019-06-15 | Disposition: A | Discharge status: Home / Self Care | Payer: Managed Care, Other (non HMO) | Attending: Obstetrics & Gynecology | Admitting: Obstetrics & Gynecology

## 2019-06-14 ENCOUNTER — Other Ambulatory Visit: Payer: Self-pay

## 2019-06-14 DIAGNOSIS — K219 Gastro-esophageal reflux disease without esophagitis: Secondary | ICD-10-CM | POA: Insufficient documentation

## 2019-06-14 DIAGNOSIS — N800 Endometriosis of uterus: Secondary | ICD-10-CM

## 2019-06-14 DIAGNOSIS — Z20828 Contact with and (suspected) exposure to other viral communicable diseases: Secondary | ICD-10-CM | POA: Insufficient documentation

## 2019-06-14 DIAGNOSIS — D259 Leiomyoma of uterus, unspecified: Secondary | ICD-10-CM

## 2019-06-14 DIAGNOSIS — Z793 Long term (current) use of hormonal contraceptives: Secondary | ICD-10-CM | POA: Insufficient documentation

## 2019-06-14 DIAGNOSIS — Z79899 Other long term (current) drug therapy: Secondary | ICD-10-CM | POA: Insufficient documentation

## 2019-06-14 DIAGNOSIS — Z30432 Encounter for removal of intrauterine contraceptive device: Secondary | ICD-10-CM

## 2019-06-14 DIAGNOSIS — N920 Excessive and frequent menstruation with regular cycle: Secondary | ICD-10-CM | POA: Diagnosis not present

## 2019-06-14 DIAGNOSIS — R32 Unspecified urinary incontinence: Secondary | ICD-10-CM | POA: Diagnosis present

## 2019-06-14 DIAGNOSIS — D219 Benign neoplasm of connective and other soft tissue, unspecified: Secondary | ICD-10-CM | POA: Insufficient documentation

## 2019-06-14 DIAGNOSIS — J45909 Unspecified asthma, uncomplicated: Secondary | ICD-10-CM | POA: Insufficient documentation

## 2019-06-14 DIAGNOSIS — Z90710 Acquired absence of both cervix and uterus: Secondary | ICD-10-CM | POA: Diagnosis present

## 2019-06-14 DIAGNOSIS — I10 Essential (primary) hypertension: Secondary | ICD-10-CM | POA: Insufficient documentation

## 2019-06-14 HISTORY — PX: PUBOVAGINAL SLING: SHX1035

## 2019-06-14 HISTORY — PX: CYSTOSCOPY: SHX5120

## 2019-06-14 HISTORY — PX: IUD REMOVAL: SHX5392

## 2019-06-14 HISTORY — PX: CYSTOCELE REPAIR: SHX163

## 2019-06-14 HISTORY — PX: LAPAROSCOPIC VAGINAL HYSTERECTOMY WITH SALPINGECTOMY: SHX6680

## 2019-06-14 LAB — POCT PREGNANCY, URINE: Preg Test, Ur: NEGATIVE

## 2019-06-14 LAB — ABO/RH: ABO/RH(D): A NEG

## 2019-06-14 SURGERY — HYSTERECTOMY, VAGINAL, LAPAROSCOPY-ASSISTED, WITH SALPINGECTOMY
Anesthesia: General

## 2019-06-14 MED ORDER — MORPHINE SULFATE (PF) 4 MG/ML IV SOLN: 1.0000 mg | INTRAVENOUS | Status: DC | PRN

## 2019-06-14 MED ORDER — ACETAMINOPHEN 325 MG PO TABS: 650.0000 mg | ORAL_TABLET | ORAL | Status: DC | PRN

## 2019-06-14 MED ORDER — OXYCODONE-ACETAMINOPHEN 5-325 MG PO TABS: 1.0000 | ORAL_TABLET | ORAL | Status: DC | PRN

## 2019-06-14 MED ORDER — ALBUTEROL SULFATE HFA 108 (90 BASE) MCG/ACT IN AERS
INHALATION_SPRAY | RESPIRATORY_TRACT | Status: DC | PRN
  Administered 2019-06-14: 8 via RESPIRATORY_TRACT

## 2019-06-14 MED ORDER — FENTANYL CITRATE (PF) 100 MCG/2ML IJ SOLN
INTRAMUSCULAR | Status: AC
  Administered 2019-06-14: 25 ug via INTRAVENOUS
  Filled 2019-06-14: qty 2

## 2019-06-14 MED ORDER — BUPIVACAINE HCL (PF) 0.5 % IJ SOLN
INTRAMUSCULAR | Status: AC
  Filled 2019-06-14: qty 30

## 2019-06-14 MED ORDER — ESTROGENS, CONJUGATED 0.625 MG/GM VA CREA
TOPICAL_CREAM | VAGINAL | Status: DC | PRN
  Administered 2019-06-14: 1 via VAGINAL

## 2019-06-14 MED ORDER — ONDANSETRON HCL 4 MG PO TABS: 4.0000 mg | ORAL_TABLET | Freq: Four times a day (QID) | ORAL | Status: DC | PRN

## 2019-06-14 MED ORDER — TRAZODONE HCL 50 MG PO TABS
25.0000 mg | ORAL_TABLET | Freq: Every evening | ORAL | Status: DC | PRN
  Filled 2019-06-14: qty 1

## 2019-06-14 MED ORDER — ACETAMINOPHEN 325 MG PO TABS
650.0000 mg | ORAL_TABLET | ORAL | Status: DC | PRN
  Administered 2019-06-14 – 2019-06-15 (×4): 650 mg via ORAL
  Filled 2019-06-14 (×4): qty 2

## 2019-06-14 MED ORDER — KETOROLAC TROMETHAMINE 30 MG/ML IJ SOLN
INTRAMUSCULAR | Status: AC
  Filled 2019-06-14: qty 1

## 2019-06-14 MED ORDER — PHENYLEPHRINE HCL (PRESSORS) 10 MG/ML IV SOLN
INTRAVENOUS | Status: DC | PRN
  Administered 2019-06-14: 50 ug via INTRAVENOUS
  Administered 2019-06-14: 100 ug via INTRAVENOUS
  Administered 2019-06-14: 50 ug via INTRAVENOUS
  Administered 2019-06-14: 100 ug via INTRAVENOUS

## 2019-06-14 MED ORDER — LACTATED RINGERS IV BOLUS
500.0000 mL | Freq: Once | INTRAVENOUS | Status: AC
  Administered 2019-06-14: 17:00:00 500 mL via INTRAVENOUS

## 2019-06-14 MED ORDER — FENTANYL CITRATE (PF) 100 MCG/2ML IJ SOLN
25.0000 ug | INTRAMUSCULAR | Status: AC | PRN
  Administered 2019-06-14 (×6): 25 ug via INTRAVENOUS

## 2019-06-14 MED ORDER — LACTATED RINGERS IV SOLN
INTRAVENOUS | Status: DC
  Administered 2019-06-14 (×2): via INTRAVENOUS

## 2019-06-14 MED ORDER — KETOROLAC TROMETHAMINE 30 MG/ML IJ SOLN
30.0000 mg | Freq: Four times a day (QID) | INTRAMUSCULAR | Status: DC
  Administered 2019-06-14: 30 mg via INTRAVENOUS

## 2019-06-14 MED ORDER — MONTELUKAST SODIUM 10 MG PO TABS
10.0000 mg | ORAL_TABLET | Freq: Every day | ORAL | Status: DC
  Administered 2019-06-14: 22:00:00 10 mg via ORAL
  Filled 2019-06-14 (×2): qty 1

## 2019-06-14 MED ORDER — MIRABEGRON ER 25 MG PO TB24
25.0000 mg | ORAL_TABLET | Freq: Every day | ORAL | Status: DC
  Administered 2019-06-14 – 2019-06-15 (×2): 25 mg via ORAL
  Filled 2019-06-14 (×2): qty 1

## 2019-06-14 MED ORDER — PANTOPRAZOLE SODIUM 40 MG PO TBEC
40.0000 mg | DELAYED_RELEASE_TABLET | Freq: Every day | ORAL | Status: DC
  Administered 2019-06-15: 40 mg via ORAL
  Filled 2019-06-14: qty 1

## 2019-06-14 MED ORDER — AMLODIPINE BESYLATE 5 MG PO TABS
5.0000 mg | ORAL_TABLET | Freq: Every day | ORAL | Status: DC
  Filled 2019-06-14: qty 1

## 2019-06-14 MED ORDER — LIDOCAINE HCL (CARDIAC) PF 100 MG/5ML IV SOSY
PREFILLED_SYRINGE | INTRAVENOUS | Status: DC | PRN
  Administered 2019-06-14: 100 mg via INTRAVENOUS

## 2019-06-14 MED ORDER — SUGAMMADEX SODIUM 200 MG/2ML IV SOLN
INTRAVENOUS | Status: DC | PRN
  Administered 2019-06-14: 200 mg via INTRAVENOUS

## 2019-06-14 MED ORDER — ACETAMINOPHEN 10 MG/ML IV SOLN
INTRAVENOUS | Status: DC | PRN
  Administered 2019-06-14: 1000 mg via INTRAVENOUS

## 2019-06-14 MED ORDER — ONDANSETRON HCL 4 MG/2ML IJ SOLN: 4.0000 mg | Freq: Once | INTRAMUSCULAR | Status: DC | PRN

## 2019-06-14 MED ORDER — BISACODYL 10 MG RE SUPP: 10.0000 mg | Freq: Every day | RECTAL | Status: DC | PRN

## 2019-06-14 MED ORDER — ROCURONIUM BROMIDE 100 MG/10ML IV SOLN
INTRAVENOUS | Status: DC | PRN
  Administered 2019-06-14: 40 mg via INTRAVENOUS
  Administered 2019-06-14: 10 mg via INTRAVENOUS
  Administered 2019-06-14: 30 mg via INTRAVENOUS

## 2019-06-14 MED ORDER — LACTATED RINGERS IV SOLN: INTRAVENOUS | Status: DC

## 2019-06-14 MED ORDER — MIRABEGRON ER 25 MG PO TB24: 25.0000 mg | ORAL_TABLET | Freq: Every day | ORAL | 1 refills | Status: DC

## 2019-06-14 MED ORDER — LACTATED RINGERS IV SOLN
INTRAVENOUS | Status: DC
  Administered 2019-06-14: 19:00:00 via INTRAVENOUS

## 2019-06-14 MED ORDER — FENTANYL CITRATE (PF) 100 MCG/2ML IJ SOLN
INTRAMUSCULAR | Status: AC
  Administered 2019-06-14: 18:00:00 25 ug via INTRAVENOUS
  Filled 2019-06-14: qty 2

## 2019-06-14 MED ORDER — PROPOFOL 10 MG/ML IV BOLUS
INTRAVENOUS | Status: DC | PRN
  Administered 2019-06-14: 150 mg via INTRAVENOUS

## 2019-06-14 MED ORDER — EPHEDRINE SULFATE 50 MG/ML IJ SOLN
INTRAMUSCULAR | Status: DC | PRN
  Administered 2019-06-14 (×3): 5 mg via INTRAVENOUS

## 2019-06-14 MED ORDER — BUPIVACAINE HCL (PF) 0.5 % IJ SOLN
INTRAMUSCULAR | Status: DC | PRN
  Administered 2019-06-14: 10 mL

## 2019-06-14 MED ORDER — DEXAMETHASONE SODIUM PHOSPHATE 10 MG/ML IJ SOLN
INTRAMUSCULAR | Status: DC | PRN
  Administered 2019-06-14: 10 mg via INTRAVENOUS

## 2019-06-14 MED ORDER — MORPHINE SULFATE (PF) 2 MG/ML IV SOLN: 1.0000 mg | INTRAVENOUS | Status: DC | PRN

## 2019-06-14 MED ORDER — ALPRAZOLAM 0.5 MG PO TABS: 1.0000 mg | ORAL_TABLET | Freq: Every day | ORAL | Status: DC | PRN

## 2019-06-14 MED ORDER — LIDOCAINE-EPINEPHRINE 1 %-1:100000 IJ SOLN
INTRAMUSCULAR | Status: DC | PRN
  Administered 2019-06-14: 12 mL
  Administered 2019-06-14: 10 mL

## 2019-06-14 MED ORDER — DOCUSATE SODIUM 100 MG PO CAPS
100.0000 mg | ORAL_CAPSULE | Freq: Two times a day (BID) | ORAL | Status: DC
  Administered 2019-06-14 – 2019-06-15 (×2): 100 mg via ORAL
  Filled 2019-06-14 (×2): qty 1

## 2019-06-14 MED ORDER — NITROFURANTOIN MONOHYD MACRO 100 MG PO CAPS: 100.0000 mg | ORAL_CAPSULE | Freq: Every day | ORAL | 1 refills | Status: DC

## 2019-06-14 MED ORDER — FENTANYL CITRATE (PF) 100 MCG/2ML IJ SOLN
INTRAMUSCULAR | Status: DC | PRN
  Administered 2019-06-14 (×4): 50 ug via INTRAVENOUS

## 2019-06-14 MED ORDER — SENNOSIDES-DOCUSATE SODIUM 8.6-50 MG PO TABS: 1.0000 | ORAL_TABLET | Freq: Every evening | ORAL | Status: DC | PRN

## 2019-06-14 MED ORDER — PROPOFOL 500 MG/50ML IV EMUL
INTRAVENOUS | Status: DC | PRN
  Administered 2019-06-14: 20 ug/kg/min via INTRAVENOUS

## 2019-06-14 MED ORDER — ONDANSETRON HCL 4 MG/2ML IJ SOLN
INTRAMUSCULAR | Status: DC | PRN
  Administered 2019-06-14: 4 mg via INTRAVENOUS

## 2019-06-14 MED ORDER — OXYCODONE-ACETAMINOPHEN 5-325 MG PO TABS: 1.0000 | ORAL_TABLET | ORAL | 0 refills | Status: DC | PRN

## 2019-06-14 MED ORDER — ONDANSETRON HCL 4 MG/2ML IJ SOLN: 4.0000 mg | Freq: Four times a day (QID) | INTRAMUSCULAR | Status: DC | PRN

## 2019-06-14 MED ORDER — MIRABEGRON ER 25 MG PO TB24: 25.0000 mg | ORAL_TABLET | Freq: Every day | ORAL | 0 refills | Status: DC

## 2019-06-14 MED ORDER — KETOROLAC TROMETHAMINE 30 MG/ML IJ SOLN
30.0000 mg | Freq: Four times a day (QID) | INTRAMUSCULAR | Status: DC
  Administered 2019-06-14 – 2019-06-15 (×2): 30 mg via INTRAVENOUS
  Filled 2019-06-14 (×2): qty 1

## 2019-06-14 MED ORDER — SIMETHICONE 80 MG PO CHEW: 80.0000 mg | CHEWABLE_TABLET | Freq: Four times a day (QID) | ORAL | Status: DC | PRN

## 2019-06-14 MED ORDER — BUPROPION HCL 75 MG PO TABS
75.0000 mg | ORAL_TABLET | Freq: Two times a day (BID) | ORAL | Status: DC
  Administered 2019-06-14 – 2019-06-15 (×2): 75 mg via ORAL
  Filled 2019-06-14 (×3): qty 1

## 2019-06-14 MED ORDER — ACETAMINOPHEN 650 MG RE SUPP
650.0000 mg | RECTAL | Status: DC | PRN
  Filled 2019-06-14: qty 1

## 2019-06-14 MED ORDER — CITALOPRAM HYDROBROMIDE 20 MG PO TABS
40.0000 mg | ORAL_TABLET | Freq: Every evening | ORAL | Status: DC
  Administered 2019-06-14: 40 mg via ORAL
  Filled 2019-06-14 (×2): qty 2

## 2019-06-14 MED ORDER — MIDAZOLAM HCL 2 MG/2ML IJ SOLN
INTRAMUSCULAR | Status: DC | PRN
  Administered 2019-06-14: 2 mg via INTRAVENOUS

## 2019-06-14 MED ORDER — NITROFURANTOIN MONOHYD MACRO 100 MG PO CAPS
100.0000 mg | ORAL_CAPSULE | Freq: Every day | ORAL | Status: DC
  Administered 2019-06-14: 100 mg via ORAL
  Filled 2019-06-14 (×2): qty 1

## 2019-06-14 SURGICAL SUPPLY — 70 items
BAG URINE DRAINAGE (UROLOGICAL SUPPLIES) ×5 IMPLANT
BLADE SURG 15 STRL LF DISP TIS (BLADE) ×3 IMPLANT
BLADE SURG 15 STRL SS (BLADE) ×2
BLADE SURG SZ10 CARB STEEL (BLADE) ×5 IMPLANT
BLADE SURG SZ11 CARB STEEL (BLADE) ×2 IMPLANT
BNDG GAUZE 4.5X4.1 6PLY STRL (MISCELLANEOUS) ×5 IMPLANT
CANISTER SUCT 1200ML W/VALVE (MISCELLANEOUS) ×5 IMPLANT
CATH FOLEY 2WAY  5CC 16FR (CATHETERS) ×2
CATH URTH 16FR FL 2W BLN LF (CATHETERS) ×3 IMPLANT
COVER WAND RF STERILE (DRAPES) ×5 IMPLANT
DERMABOND ADVANCED (GAUZE/BANDAGES/DRESSINGS) ×6
DERMABOND ADVANCED .7 DNX12 (GAUZE/BANDAGES/DRESSINGS) ×3 IMPLANT
DRAPE 3/4 80X56 (DRAPES) ×5 IMPLANT
DRAPE LAPAROTOMY 100X77 ABD (DRAPES) ×5 IMPLANT
DRAPE PERI LITHO V/GYN (MISCELLANEOUS) ×3 IMPLANT
DRAPE UNDER BUTTOCK W/FLU (DRAPES) ×5 IMPLANT
ELECT CAUTERY BLADE 6.4 (BLADE) ×3 IMPLANT
ELECT REM PT RETURN 9FT ADLT (ELECTROSURGICAL) ×5
ELECTRODE REM PT RTRN 9FT ADLT (ELECTROSURGICAL) ×3 IMPLANT
GAUZE 4X4 16PLY RFD (DISPOSABLE) ×5 IMPLANT
GAUZE PACK 2X3YD (GAUZE/BANDAGES/DRESSINGS) ×5 IMPLANT
GLOVE BIO SURGEON STRL SZ8 (GLOVE) ×10 IMPLANT
GOWN STRL REUS W/ TWL LRG LVL3 (GOWN DISPOSABLE) ×9 IMPLANT
GOWN STRL REUS W/ TWL XL LVL3 (GOWN DISPOSABLE) ×3 IMPLANT
GOWN STRL REUS W/TWL LRG LVL3 (GOWN DISPOSABLE) ×6
GOWN STRL REUS W/TWL XL LVL3 (GOWN DISPOSABLE) ×2
IV LACTATED RINGERS 1000ML (IV SOLUTION) ×5 IMPLANT
KIT TURNOVER CYSTO (KITS) ×5 IMPLANT
KIT TURNOVER KIT A (KITS) ×5 IMPLANT
LABEL OR SOLS (LABEL) ×5 IMPLANT
MANIPULATOR VCARE LG CRV RETR (MISCELLANEOUS) ×2 IMPLANT
NDL HPO THNWL 1X22GA REG BVL (NEEDLE) ×3 IMPLANT
NDL MAYO CATGUT SZ4 (NEEDLE) ×5 IMPLANT
NDL MAYO CATGUT SZ4 TCR NDL (NEEDLE) ×3 IMPLANT
NDL SAFETY ECLIPSE 18X1.5 (NEEDLE) ×3 IMPLANT
NDL SPNL 22GX3.5 QUINCKE BK (NEEDLE) ×3 IMPLANT
NEEDLE HYPO 18GX1.5 SHARP (NEEDLE) ×2
NEEDLE HYPO 22GX1.5 SAFETY (NEEDLE) ×7 IMPLANT
NEEDLE SAFETY 22GX1 (NEEDLE) ×2
NEEDLE SPNL 22GX3.5 QUINCKE BK (NEEDLE) ×5 IMPLANT
NS IRRIG 500ML POUR BTL (IV SOLUTION) ×5 IMPLANT
PACK BASIN MINOR ARMC (MISCELLANEOUS) ×7 IMPLANT
PAD OB MATERNITY 4.3X12.25 (PERSONAL CARE ITEMS) ×5 IMPLANT
PAD PREP 24X41 OB/GYN DISP (PERSONAL CARE ITEMS) ×5 IMPLANT
SET CYSTO W/LG BORE CLAMP LF (SET/KITS/TRAYS/PACK) ×5 IMPLANT
SHEARS HARMONIC ACE PLUS 36CM (ENDOMECHANICALS) ×2 IMPLANT
SLEEVE ENDOPATH XCEL 5M (ENDOMECHANICALS) ×4 IMPLANT
SLING TVT EXACT (Sling) ×5 IMPLANT
SOL PREP PVP 2OZ (MISCELLANEOUS) ×5
SOLUTION PREP PVP 2OZ (MISCELLANEOUS) ×3 IMPLANT
STRAP SAFETY 5IN WIDE (MISCELLANEOUS) ×5 IMPLANT
SURGILUBE 2OZ TUBE FLIPTOP (MISCELLANEOUS) ×5 IMPLANT
SUT ETHIBOND NAB CT1 #1 30IN (SUTURE) ×14 IMPLANT
SUT VIC AB 0 CT1 27 (SUTURE) ×4
SUT VIC AB 0 CT1 27XCR 8 STRN (SUTURE) ×6 IMPLANT
SUT VIC AB 0 CT1 36 (SUTURE) ×5 IMPLANT
SUT VIC AB 1 CT1 36 (SUTURE) ×5 IMPLANT
SUT VIC AB 2-0 CT1 (SUTURE) ×10 IMPLANT
SUT VIC AB 2-0 CT1 27 (SUTURE) ×2
SUT VIC AB 2-0 CT1 TAPERPNT 27 (SUTURE) ×3 IMPLANT
SUT VIC AB 3-0 SH 27 (SUTURE) ×2
SUT VIC AB 3-0 SH 27X BRD (SUTURE) ×3 IMPLANT
SUT VICRYL+ 3-0 36IN CT-1 (SUTURE) ×5 IMPLANT
SYR 10ML LL (SYRINGE) ×12 IMPLANT
SYR BULB IRRIG 60ML STRL (SYRINGE) ×5 IMPLANT
SYR CONTROL 10ML LL (SYRINGE) ×5 IMPLANT
SYRINGE IRR TOOMEY STRL 70CC (SYRINGE) ×3 IMPLANT
TAPE TRANSPORE STRL 2 31045 (GAUZE/BANDAGES/DRESSINGS) ×5 IMPLANT
TROCAR XCEL NON-BLD 5MMX100MML (ENDOMECHANICALS) ×2 IMPLANT
TUBING EVAC SMOKE HEATED PNEUM (TUBING) ×2 IMPLANT

## 2019-06-14 NOTE — Anesthesia Post-op Follow-up Note (Signed)
Anesthesia QCDR form completed.        

## 2019-06-14 NOTE — Op Note (Signed)
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  ANESTHESIA: General endotracheal anesthesia  ESTIMATED BLOOD LOSS: 50 mL  SPECIMENS: Uterus, Tubes.  COMPLICATIONS: None  DISPOSITION: stable to PACU  FINDINGS: Intraabdominal adhesions were not noted.  Fibroid uterus with large fibroid fundal and midline  PROCEDURE:  The patient was taken to the OR where anesthesia was administed. She was prepped and draped in the normal sterile fashion in the dorsal lithotomy position in the [place] stirrups. A time out was performed. A Graves speculum was inserted, the cervix was grasped with a single tooth tenaculum and the endometrial cavity was sounded. The cervix was progressively dilated to a size 18 [place] with [employer] dilators. A V-Care uterine manipulator was inserted in the usual fashion without incident. Gloves were changed and attention was turned to the abdomen.  It was determined that a vaginal hysterectomy was not possible, with need for laparoscopic assistance (size, prolapse level).  IUD was removed.  An infraumbilical transverse 73mm skin incision was made with the scalpel after local anesthesia applied to the skin. A Veress-step needle was inserted in the usual fashion and confirmed using the hanging drop technique. A pneumoperitoneum was obtained by insufflation of CO2 (opening pressure of 2mmHg) to 23mmHg. A diagnostic laparoscopy was performed yielding the previously described  findings. Attention was turned to the left lower quadrant where after visualization of the inferior epigastric vessels a 58mm skin incision was made with the scalpel. A 5 mm laparoscopic port was inserted. The same procedure was repeated in the right lower quadrant with a 88mm trocar. Attention was turned to the left aspect of the uterus, where after visualization of the ureter, the round ligament was coagulated and transected using the 8mm Harmonic Scapel. The anterior and posterior leafs of the broad ligament were dissected off as the anterior one was coagulated and transected in a caudal direction towards the cuff of the uterine manipulator.  Attention was then turned to the left fallopian tube which was recognized by visualization of the fimbria. The tube is excised to its attachment to the uterus. The uterine-ovarian ligament and its blood vessels were carefully coagulated and transected using the Harmonic scapel.  Attention was turned to the right aspect of the uterus where the same procedure was performed.  The vesicouterine reflection of the peritoneum was dissected with the harmonic scapel and the bladder flap was created bluntly.  The uterine vessels were coagulated and transected bilaterally using first bipolar cautery and then the harmonic scapel. A 360 degree, circumferential colpotomy was done to completely amputate the uterus with cervix and tubes. Once the specimen was amputated it was delivered through the vagina.    Attention is then returned to the vagina.  Uterus removed.  Vaginal cuff mucosa is closed with a 0 Vicryl suture in a running locking fashion.   Anterior colporrhaphy is performed.  Allis clamps are placed along the anterior vaginal wall, lidocaine is used to infiltrate the plane, and incision is made midline vertical.  Endopelvic fascia is dissected free of vaginal mucosa.     The retropubic space is carefully dissected both with  sharp and blunt dissection.  The rigid catheter  guide is placed in the foley with appropriate deviation of the bladder while placing the sling.  Using the TVT Exact device and material, the trocar is placed through the right then the left retropubic space and then trough an exit incision in the mons pubis.  Cystoscopy is performed and a right sided bladder perforation is noted with the trocar.  It is removed and replaced in similar fashion. Repeat cystoscopy is performed with saline distension of the bladder with no perforations noted.  200 mL is left in bladder.  The TVT sling is then pulled upward until contact is made along the bladder neck, and a Kelly clamp is used to ensure it is not cinched too tightly.  Suprapubic pressure is performed to ensure no leakage of urine.  The sleeves are then removed from the sling.  Incisions are closed with Dermabond.   Fascia is plicated w interrupted vicryl sutures.  Tissue is plicated over the sling as well to provide additional support.  Excess mucosa is excised.  Vaginal incision is closed with a running locking Vicryl suture.  Excellent hemostasis was noted at the end of the case.   Vaginal packing w premarin cream is placed.   A Foley catheter is left in  place inside her bladder. Clear, yellow urine was noted. All instrument needle and lap counts were correct x 2. Patient was awakened taken to recovery room in stable condition.   At this point the procedure was finalized. Right lower quadrant fascia incision is closed with a vicryl suture using the fascia closure device. All the instruments were removed from the patient's body. Gas was expelled and patient is leveled.  Incisions are closed with skin adhesive.     Patient goes to recovery room in stable condition.  All sponge, instrument, and needle counts are correct x2.     Barnett Applebaum, MD, [facility], [facility] 06/14/2019  3:42 PM    PROCEDURE:   Patient was taken to the OR where she was placed in dorsal lithotomy in  [place]. She was prepped and draped in the usual sterile fashion. A timeout was performed. Foley is placed into bladder. A speculum was placed inside the vagina. Above findings noted.  Anterior colporrhaphy is performed.  Allis clamps are placed along the anterior vaginal wall, lidocaine is used to infiltrate the plane, and incision is made midline vertical.  Endopelvic fascia is dissected free of vaginal mucosa.    The retropubic space is carefully dissected both with sharp and blunt dissection.  The rigid catheter guide is placed in the foley with appropriate deviation of the bladder while placing the sling.  Using the TVT Exact device and material, the trocar is placed through the right then the left retropubic space and then trough an exit incision in the mons pubis.  Cystoscopy is performed with saline distension of the bladder with no perforations noted.  200 mL is left in bladder.  The TVT sling is then pulled upward until contact is made along the bladder neck, and a Kelly clamp is used to ensure it is not cinched too tightly.  Suprapubic pressure is performed to ensure no leakage of urine.  The sleeves are then removed from the sling.  Incisions are closed with Dermabond.  Fascia is plicated w interrupted vicryl sutures.  Tissue is plicated over the sling as well to provide additional support.  Excess mucosa is excised.  Vaginal incision  is closed with a running locking Vicryl suture.  Excellent hemostasis was noted at the end of the case.   A Foley catheter is left in  place inside her bladder. Clear, yellow urine was noted. All instrument needle and lap counts were correct x 2. Patient was awakened taken to recovery room in stable condition.  Barnett Applebaum, MD, [facility], [facility] 06/14/2019  3:42 PM

## 2019-06-14 NOTE — Anesthesia Procedure Notes (Signed)
Procedure Name: Intubation Date/Time: 06/14/2019 1:18 PM Performed by: Lavone Orn, CRNA Pre-anesthesia Checklist: Patient identified, Emergency Drugs available, Suction available, Patient being monitored and Timeout performed Patient Re-evaluated:Patient Re-evaluated prior to induction Oxygen Delivery Method: Circle system utilized Preoxygenation: Pre-oxygenation with 100% oxygen Induction Type: IV induction Ventilation: Mask ventilation without difficulty Laryngoscope Size: Glidescope and 3 Grade View: Grade I Tube type: Oral Number of attempts: 1 Airway Equipment and Method: Stylet and Video-laryngoscopy Placement Confirmation: ETT inserted through vocal cords under direct vision,  positive ETCO2 and breath sounds checked- equal and bilateral Secured at: 22 cm Tube secured with: Tape Dental Injury: Teeth and Oropharynx as per pre-operative assessment

## 2019-06-14 NOTE — Transfer of Care (Addendum)
Immediate Anesthesia Transfer of Care Note  Patient: Mackenzie Bailey  Procedure(s) Performed: LAPAROSCOPIC ASSISTED VAGINAL HYSTERECTOMY WITH BILATERAL SALPINGECTOMY (Bilateral ) ANTERIOR REPAIR (CYSTOCELE) (N/A ) PUBO-VAGINAL SLING (N/A ) CYSTOSCOPY (N/A ) INTRAUTERINE DEVICE (IUD) REMOVAL  Patient Location: PACU  Anesthesia Type:General  Level of Consciousness: awake and drowsy  Airway & Oxygen Therapy: Patient Spontanous Breathing and Patient connected to face mask oxygen  Post-op Assessment: Report given to RN and Post -op Vital signs reviewed and stable  Post vital signs: stable  Last Vitals:  Vitals Value Taken Time  BP 119/62 06/14/19 1547  Temp    Pulse 88 06/14/19 1552  Resp 18 06/14/19 1552  SpO2 96 % 06/14/19 1552  Vitals shown include unvalidated device data.  Last Pain:  Vitals:   06/14/19 1550  TempSrc:   PainSc: (P) Asleep         Complications: No apparent anesthesia complications

## 2019-06-14 NOTE — Progress Notes (Signed)
Day of Surgery Procedure(s) (LRB): LAPAROSCOPIC ASSISTED VAGINAL HYSTERECTOMY WITH BILATERAL SALPINGECTOMY (Bilateral) ANTERIOR REPAIR (CYSTOCELE) (N/A) PUBO-VAGINAL SLING (N/A) CYSTOSCOPY (N/A) INTRAUTERINE DEVICE (IUD) REMOVAL  Subjective: Patient reports incisional pain and has been monitoredin [facility] with lower BP and feelings of fatigue.    Objective: I have reviewed patient's vital signs, intake and output, medications and labs.  Abd: Min T, ND Incision: clean, dry and intact Extr: no calf T, no edema  Assessment: s/p Procedure(s): LAPAROSCOPIC ASSISTED VAGINAL HYSTERECTOMY WITH BILATERAL SALPINGECTOMY (Bilateral) ANTERIOR REPAIR (CYSTOCELE) (N/A) PUBO-VAGINAL SLING (N/A) CYSTOSCOPY (N/A) INTRAUTERINE DEVICE (IUD) REMOVAL: stable  Plan: Admit overnight for observation  IVF, monitor hemodynamics Ambulate and diet as able Meds- pain meds, ABX, Myrbetriq Keep foley one week due to extensive bladder surgery w sling   LOS: 0 days    Hoyt Koch 06/14/2019, 5:59 PM

## 2019-06-14 NOTE — Anesthesia Postprocedure Evaluation (Signed)
Anesthesia Post Note  Patient: Mackenzie Bailey  Procedure(s) Performed: LAPAROSCOPIC ASSISTED VAGINAL HYSTERECTOMY WITH BILATERAL SALPINGECTOMY (Bilateral ) ANTERIOR REPAIR (CYSTOCELE) (N/A ) PUBO-VAGINAL SLING (N/A ) CYSTOSCOPY (N/A ) INTRAUTERINE DEVICE (IUD) REMOVAL  Patient location during evaluation: PACU Anesthesia Type: General Level of consciousness: awake and alert Pain management: pain level controlled Vital Signs Assessment: post-procedure vital signs reviewed and stable Respiratory status: spontaneous breathing, nonlabored ventilation, respiratory function stable and patient connected to nasal cannula oxygen Cardiovascular status: blood pressure returned to baseline and stable Postop Assessment: no apparent nausea or vomiting Comments: Patient slower to wake up, conversant when I talked with her.  Blood pressure running somewhat lower but noromcardic.  Spoke with Dr. Kenton Kingfisher about the patient, plan to have patient admitted for overnight observation.     Last Vitals:  Vitals:   06/14/19 1805 06/14/19 1837  BP: (!) 92/47 (!) 97/53  Pulse: 73 78  Resp: 16 17  Temp:  36.6 C  SpO2: 94% 94%    Last Pain:  Vitals:   06/14/19 1840  TempSrc:   PainSc: 8                  Precious Haws Tura Roller

## 2019-06-14 NOTE — Anesthesia Preprocedure Evaluation (Addendum)
Anesthesia Evaluation  Patient identified by MRN, date of birth, ID band Patient awake    Reviewed: Allergy & Precautions, NPO status , Patient's Chart, lab work & pertinent test results  History of Anesthesia Complications (+) PONV and history of anesthetic complications  Airway Mallampati: III       Dental   Pulmonary asthma , neg sleep apnea, neg COPD, Not current smoker,           Cardiovascular hypertension, Pt. on medications and Pt. on home beta blockers (-) Past MI and (-) CHF (-) dysrhythmias + Valvular Problems/Murmurs (murmur) MVP      Neuro/Psych neg Seizures Anxiety    GI/Hepatic Neg liver ROS, GERD  Medicated and Controlled,  Endo/Other  neg diabetes  Renal/GU negative Renal ROS     Musculoskeletal   Abdominal   Peds  Hematology   Anesthesia Other Findings   Reproductive/Obstetrics                            Anesthesia Physical Anesthesia Plan  ASA: III  Anesthesia Plan: General   Post-op Pain Management:    Induction: Intravenous  PONV Risk Score and Plan: 4 or greater and Dexamethasone, Ondansetron and Midazolam  Airway Management Planned: Oral ETT  Additional Equipment:   Intra-op Plan:   Post-operative Plan:   Informed Consent: I have reviewed the patients History and Physical, chart, labs and discussed the procedure including the risks, benefits and alternatives for the proposed anesthesia with the patient or authorized representative who has indicated his/her understanding and acceptance.       Plan Discussed with:   Anesthesia Plan Comments:         Anesthesia Quick Evaluation

## 2019-06-14 NOTE — Interval H&P Note (Signed)
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  The various methods of treatment have been discussed with the patient and family. After consideration of risks, benefits and other options for treatment, the patient has consented to  Procedure(s): HYSTERECTOMY VAGINAL, POSSIBLE LAPAROSCOPY (N/A) ANTERIOR REPAIR (CYSTOCELE) (N/A) PUBO-VAGINAL SLING (N/A) CYSTOSCOPY (N/A) as a surgical intervention.  The patient's history has been reviewed, patient examined, no change in status, stable for surgery.  I have reviewed the patient's chart and labs.  Questions were answered to the patient's satisfaction.     Hoyt Koch

## 2019-06-14 NOTE — Anesthesia Postprocedure Evaluation (Deleted)
Anesthesia Post Note  Patient: Mackenzie Bailey  Procedure(s) Performed: HYSTERECTOMY VAGINAL, POSSIBLE LAPAROSCOPY (N/A ) ANTERIOR REPAIR (CYSTOCELE) (N/A ) PUBO-VAGINAL SLING (N/A ) CYSTOSCOPY (N/A )  Patient location during evaluation: PACU Anesthesia Type: General Level of consciousness: awake and alert Pain management: pain level controlled Vital Signs Assessment: post-procedure vital signs reviewed and stable Respiratory status: spontaneous breathing and respiratory function stable Cardiovascular status: stable Anesthetic complications: no     Last Vitals:  Vitals:   06/14/19 1003  BP: 127/75  Pulse: 78  Resp: 16  Temp: (!) 35.7 C  SpO2: 98%    Last Pain:  Vitals:   06/14/19 1003  TempSrc: Temporal  PainSc: 0-No pain                 Rogenia Werntz K

## 2019-06-15 ENCOUNTER — Other Ambulatory Visit: Payer: Self-pay | Admitting: Obstetrics & Gynecology

## 2019-06-15 ENCOUNTER — Encounter: Payer: Self-pay | Admitting: Obstetrics & Gynecology

## 2019-06-15 DIAGNOSIS — N920 Excessive and frequent menstruation with regular cycle: Secondary | ICD-10-CM | POA: Diagnosis not present

## 2019-06-15 LAB — HEMOGLOBIN: Hemoglobin: 11.3 g/dL — ABNORMAL LOW (ref 12.0–15.0)

## 2019-06-15 MED ORDER — OXYBUTYNIN CHLORIDE ER 10 MG PO TB24: 10.0000 mg | ORAL_TABLET | Freq: Every day | ORAL | 0 refills | Status: DC

## 2019-06-15 NOTE — Discharge Instructions (Signed)
Urethral Vaginal Sling, Care After This sheet gives you information about how to care for yourself after your procedure. Your health care provider may also give you more specific instructions. If you have problems or questions, contact your health care provider. What can I expect after the procedure? After the procedure:  It is common to have some abdominal pain. Your health care provider will give you pain medicines for this.  You may also have a gauze packing in the vagina to prevent bleeding. This will be removed in 1-2 days. Follow these instructions at home: Incision care   Follow instructions from your health care provider about how to take care of your abdominal incision. Make sure you: ? Wash your hands with soap and water before you change your bandage (dressing). If soap and water are not available, use hand sanitizer. ? Change your dressing as told by your health care provider. ? Leave stitches (sutures), skin glue, or adhesive strips in place. These skin closures may need to stay in place for 2 weeks or longer. If adhesive strip edges start to loosen and curl up, you may trim the loose edges. Do not remove adhesive strips completely unless your health care provider tells you to do that.  Check your incision area every day for signs of infection. Check for: ? Redness, swelling, or pain. ? Fluid or blood. ? Warmth. ? Pus or a bad smell. Activity  Get plenty of rest.  Limit exercise and activities as told by your health care provider.  Do not lift anything that is heavier than 5 pounds (2.3 kg) until your health care provider says that it is safe.  Do not douche, use tampons, or have sexual intercourse for 6 weeks after your procedure or as told by your health care provider.  Do not drive or use heavy machinery while taking prescription pain medicine.  Return to your normal activities as told by your health care provider. Ask your health care provider what activities are  safe for you. General instructions  If you have a urinary catheter in place, follow instructions from your health care provider about how to empty the catheter bag.  Do not take baths, swim, or use a hot tub until your health care provider approves. Ask your health care provider if you may take showers. You may only be allowed to take sponge baths.  Take over-the-counter and prescription medicines only as told by your health care provider. Do not take aspirin because it can cause bleeding.  Do not use any products that contain nicotine or tobacco, such as cigarettes and e-cigarettes. These can delay bone healing. If you need help quitting, ask your health care provider.  You may resume your usual diet. Eat a well-balanced diet.  To prevent or treat constipation while you are taking prescription pain medicine, your health care provider may recommend that you: ? Drink enough fluid to keep your urine pale yellow. ? Take over-the-counter or prescription medicines. ? Eat foods that are high in fiber, such as fresh fruits and vegetables, whole grains, and beans. ? Limit foods that are high in fat and processed sugars, such as fried and sweet foods.  Avoid straining when having a bowel movement.  Keep all follow-up visits as told by your health care provider. This is important. Contact a health care provider if:  You have a heavy or bad smelling vaginal discharge.  You have bruising in the vaginal area.  You have pain that is not controlled with medicines.  Your incision feels warm to the touch.  You have redness, swelling, or pain around your incision.  You have pus or a bad smell coming from your incision.  You have fluid or blood coming from your incision.  You feel faint or light-headed.  You have a rash. Get help right away if:  You have a fever.  You faint.  You have shortness of breath.  You have chest, abdominal, or leg pain.  You have pain when urinating or cannot  urinate.  Your catheter is still in your bladder and it becomes blocked.  You have vaginal bleeding.  You have swelling, redness, and pain in the vaginal area. Summary  After the procedure, it is common to have some abdominal pain. Your health care provider will give you pain medicines for this.  Follow instructions from your health care provider about how to take care of your incision.  Limit exercise and activities as told by your health care provider.  Contact your health care provider if you have any signs of infection after your surgery. This information is not intended to replace advice given to you by your health care provider. Make sure you discuss any questions you have with your health care provider. Document Released: 07/25/2013 Document Revised: 09/16/2017 Document Reviewed: 01/12/2017 Elsevier Patient Education  2020 [place]   1) The drugs that you were given will stay in your system until tomorrow so for the next 24 hours you should not:  A) Drive an automobile B) Make any legal decisions C) Drink any alcoholic beverage   2) You may resume regular meals tomorrow.  Today it is better to start with liquids and gradually work up to solid foods.  You may eat anything you prefer, but it is better to start with liquids, then soup and crackers, and gradually work up to solid foods.   3) Please notify your doctor immediately if you have any unusual bleeding, trouble breathing, redness and pain at the surgery site, drainage, fever, or pain not relieved by medication.    4) Additional Instructions:        Please contact your physician with any problems or Same Day Surgery at [phone], Monday through Friday 6 am to 4 pm, or [place] [facility] number at [phone].   Call your doctor for incision concerns including redness, swelling, bleeding or drainage, or if begins to come apart.  No strenuous  activity or heavy lifting for 6 weeks.  No intercourse, tampons, or douching for 6 weeks.  No tub bahts- showers only.  Call your doctor for increased pain or vaginal bleeding, temperature above 100.4, depression, severe headache, vision changes or dizziness, or concerns.

## 2019-06-15 NOTE — Discharge Summary (Signed)
Gynecology Physician Postoperative Discharge Summary  Patient ID: Mackenzie Bailey MRN: [id number] DOB/AGE: May 24, 1966 53 y.o.  Admit Date: 06/14/2019 Discharge Date: 06/15/2019  Preoperative Diagnoses: Fibroid, Incontinence  Procedures: Procedure(s) (LRB): LAPAROSCOPIC ASSISTED VAGINAL HYSTERECTOMY WITH BILATERAL SALPINGECTOMY (Bilateral) ANTERIOR REPAIR (CYSTOCELE) (N/A) PUBO-VAGINAL SLING (N/A) CYSTOSCOPY (N/A) INTRAUTERINE DEVICE (IUD) REMOVAL  Significant Labs: CBC Latest Ref Rng & Units 06/15/2019 06/11/2019 01/01/2019  WBC 4.0 - 10.5 K/uL - 7.2 7.2  Hemoglobin 12.0 - 15.0 g/dL 11.3(L) 13.0 12.2  Hematocrit 36.0 - 46.0 % - 41.2 37.6  Platelets 150 - 400 K/uL - [facility] Course:  Mackenzie Bailey is a 53 y.o. G1P1001  admitted for scheduled surgery.  She underwent the procedures as mentioned above, her operation was uncomplicated. For further details about surgery, please refer to the operative report. Patient had an uncomplicated postoperative course. By time of discharge on POD#1, her pain was controlled on oral pain medications; she was ambulating,  tolerating regular diet and passing flatus. She was deemed stable for discharge to home. Foley to stay for one week.  Discharge Exam: Blood pressure 113/63, pulse 69, temperature 98.4 F (36.9 C), temperature source Oral, resp. rate 18, height 5\' 4"  (1.626 m), weight 88.9 kg, SpO2 98 %. General appearance: alert and no distress  Resp: clear to auscultation bilaterally  Cardio: regular rate and rhythm  GI: soft, non-tender; bowel sounds normal; no masses, no organomegaly.  Incision: C/D/I, no erythema, no drainage noted Pelvic: scant blood on pad  Extremities: extremities normal, atraumatic, no cyanosis or edema and Homans sign is negative, no sign of DVT  Discharged Condition: Stable  Disposition: Discharge disposition: 01-Home or Self Care       Discharge Instructions    Call MD for:  persistant  nausea and vomiting   Complete by: As directed    Call MD for:  persistant nausea and vomiting   Complete by: As directed    Call MD for:  redness, tenderness, or signs of infection (pain, swelling, redness, odor or green/yellow discharge around incision site)   Complete by: As directed    Call MD for:  redness, tenderness, or signs of infection (pain, swelling, redness, odor or green/yellow discharge around incision site)   Complete by: As directed    Call MD for:  severe uncontrolled pain   Complete by: As directed    Call MD for:  severe uncontrolled pain   Complete by: As directed    Call MD for:  temperature >100.4   Complete by: As directed    Call MD for:  temperature >100.4   Complete by: As directed    Change dressing (specify)   Complete by: As directed    Dressing change: remove any dressings tomorrow   Change dressing (specify)   Complete by: As directed    Dressing change: remove any dressings tomorrow   Diet general   Complete by: As directed    Diet general   Complete by: As directed    Discharge instructions   Complete by: As directed    Resume activities according to discharge instruction sheets   Discharge instructions   Complete by: As directed    Resume activities according to discharge instruction sheets   Increase activity slowly   Complete by: As directed    Increase activity slowly   Complete by: As directed      Allergies as of 06/15/2019      Reactions   Codeine Nausea And Vomiting  Penicillins Hives   Did it involve swelling of the face/tongue/throat, SOB, or low BP? No Did it involve sudden or severe rash/hives, skin peeling, or any reaction on the inside of your mouth or nose? No Did you need to seek medical attention at a hospital or doctor's office? No When did it last happen?Childhood allergy If all above answers are "NO", may proceed with cephalosporin use.      Medication List    STOP taking these medications   levonorgestrel  20 MCG/24HR IUD Commonly known as: MIRENA     TAKE these medications   albuterol (2.5 MG/3ML) 0.083% nebulizer solution Commonly known as: PROVENTIL Take 3 mLs (2.5 mg total) by nebulization every 6 (six) hours as needed for wheezing or shortness of breath. What changed: additional instructions   ALPRAZolam 1 MG tablet Commonly known as: XANAX Take 1 tablet (1 mg total) by mouth daily as needed for anxiety.   amLODipine 5 MG tablet Commonly known as: NORVASC Take 1 tablet (5 mg total) by mouth daily. What changed: when to take this   aspirin-acetaminophen-caffeine 250-250-65 MG tablet Commonly known as: EXCEDRIN MIGRAINE Take 2 tablets by mouth every 6 (six) hours as needed for headache.   buPROPion 75 MG tablet Commonly known as: WELLBUTRIN Take 75 mg by mouth 2 (two) times daily.   citalopram 40 MG tablet Commonly known as: CELEXA Take 1 tablet (40 mg total) by mouth daily. What changed: when to take this   diphenhydramine-acetaminophen 25-500 MG Tabs tablet Commonly known as: TYLENOL PM Take 1-2 tablets by mouth at bedtime as needed (sleep).   ibuprofen 200 MG tablet Commonly known as: ADVIL Take 400 mg by mouth every 6 (six) hours as needed for headache or moderate pain.   mirabegron ER 25 MG Tb24 tablet Commonly known as: MYRBETRIQ Take 1 tablet (25 mg total) by mouth daily.   montelukast 10 MG tablet Commonly known as: SINGULAIR Take 1 tablet (10 mg total) by mouth at bedtime.   nitrofurantoin (macrocrystal-monohydrate) 100 MG capsule Commonly known as: MACROBID Take 1 capsule (100 mg total) by mouth at bedtime.   NYQUIL PO Take 1 Dose by mouth at bedtime as needed (sleep).   omeprazole 20 MG capsule Commonly known as: PRILOSEC Take 20 mg by mouth as needed.   oxyCODONE-acetaminophen 5-325 MG tablet Commonly known as: PERCOCET/ROXICET Take 1 tablet by mouth every 4 (four) hours as needed for moderate pain.   traZODone 50 MG tablet Commonly known  as: DESYREL TAKE 0.5-1 TABLETS (25-50 MG TOTAL) BY MOUTH AT BEDTIME AS NEEDED FOR SLEEP.   Vitamin D (Ergocalciferol) 1.25 MG (50000 UT) Caps capsule Commonly known as: DRISDOL TAKE 1 CAPSULE (50,000 UNITS TOTAL) BY MOUTH EVERY 7 (SEVEN) DAYS.   Vitamin D 50 MCG (2000 UT) tablet Take 2,000 Units by mouth daily.            Discharge Care Instructions  (From admission, onward)    Foley and leg bag until removal next week   Follow-up Information    Gae Dry, MD. Go in 1 week.   Specialty: Obstetrics and Gynecology Why: For Post Op, As Scheduled on 06/20/2019.  Catheter to stay in until this appointment. Contact information: [place] [address] [phone]           Barnett Applebaum, MD

## 2019-06-15 NOTE — Progress Notes (Signed)
Discharge instructions provided.  Education with return demonstration completed for patient and sig other for all care related to catheter including hygiene, emptying, measuring output, changing from leg and overnight bag, and sign of infection.  Incision care also instructed.  Pt and sig other verbalize understanding of all instructions and follow-up care.  Pt discharged to home at 1133 on 06/12/19 via wheelchair by NT. Reed Breech, RN 06/15/2019 11:43 AM

## 2019-06-18 LAB — SURGICAL PATHOLOGY

## 2019-06-20 ENCOUNTER — Other Ambulatory Visit: Payer: Self-pay

## 2019-06-20 ENCOUNTER — Encounter: Payer: Self-pay | Admitting: Obstetrics & Gynecology

## 2019-06-20 ENCOUNTER — Ambulatory Visit (INDEPENDENT_AMBULATORY_CARE_PROVIDER_SITE_OTHER): Payer: Managed Care, Other (non HMO) | Admitting: Obstetrics & Gynecology

## 2019-06-20 VITALS — BP 130/90 | Ht 64.0 in | Wt 195.0 lb

## 2019-06-20 DIAGNOSIS — Z90710 Acquired absence of both cervix and uterus: Secondary | ICD-10-CM

## 2019-06-20 NOTE — Progress Notes (Signed)
  Postoperative Follow-up Patient presents post op from laparoscopic assisted vaginal hysterectomy, anterior colporrhaphy and sling for fibroids and urinary incontinence, 1 week ago.  Subjective: Patient reports some improvement in her preop symptoms. Eating a regular diet without difficulty. Pain is controlled with current analgesics. Medications being used: ibuprofen (OTC).  Activity: sedentary. Patient reports additional symptom's since surgery of scant vag bleeding at times.  Foley has drained well and clear, just an aggravation.  Objective: BP 130/90   Ht 5\' 4"  (1.626 m)   Wt 195 lb (88.5 kg)   BMI 33.47 kg/m  Physical Exam Constitutional:      General: She is not in acute distress.    Appearance: She is well-developed.  Cardiovascular:     Rate and Rhythm: Normal rate.  Pulmonary:     Effort: Pulmonary effort is normal.  Abdominal:     General: There is no distension.     Palpations: Abdomen is soft.     Tenderness: There is no abdominal tenderness.     Comments: Incision Healing Well   Musculoskeletal: Normal range of motion.  Neurological:     Mental Status: She is alert and oriented to person, place, and time.     Cranial Nerves: No cranial nerve deficit.  Skin:    General: Skin is warm and dry.     Assessment: s/p :  laparoscopic assisted vaginal hysterectomy, anterior colporrhaphy and sling stable  Plan: Patient has done well after surgery with no apparent complications.  I have discussed the post-operative course to date, and the expected progress moving forward.  The patient understands what complications to be concerned about.  I will see the patient in routine follow up, or sooner if needed.    Activity plan: No heavy lifting.  Pelvic rest. Foley removed, has been clear urine throughout recovery   Monitor for retention, hematuria, pain  Hoyt Koch 06/20/2019, 10:25 AM

## 2019-06-26 ENCOUNTER — Other Ambulatory Visit: Payer: Self-pay | Admitting: Obstetrics & Gynecology

## 2019-06-26 ENCOUNTER — Telehealth: Payer: Self-pay | Admitting: Obstetrics & Gynecology

## 2019-06-26 MED ORDER — NITROFURANTOIN MONOHYD MACRO 100 MG PO CAPS: 100.0000 mg | ORAL_CAPSULE | Freq: Two times a day (BID) | ORAL | 0 refills | Status: DC

## 2019-06-26 NOTE — Telephone Encounter (Signed)
Patient had surgery on 06/14/2019 and was put on some antibiotics. Patient thinks that she is having the same issue and would like to see if she could get some more medicines called in. Would like meds called to [facility] [address]. Patient is aware that [facility] is out of the office.   Cb# [phone]

## 2019-06-26 NOTE — Telephone Encounter (Signed)
Pt aware.

## 2019-06-26 NOTE — Telephone Encounter (Signed)
Let her know eRx done for Mackenzie Bailey twice daily antibiotic.  Can see pt tomorrow if has any concerns.

## 2019-07-03 ENCOUNTER — Encounter: Payer: Self-pay | Admitting: Obstetrics & Gynecology

## 2019-07-03 ENCOUNTER — Ambulatory Visit (INDEPENDENT_AMBULATORY_CARE_PROVIDER_SITE_OTHER): Payer: Managed Care, Other (non HMO) | Admitting: Obstetrics & Gynecology

## 2019-07-03 ENCOUNTER — Other Ambulatory Visit: Payer: Self-pay

## 2019-07-03 VITALS — BP 130/80 | Ht 64.0 in | Wt 196.0 lb

## 2019-07-03 DIAGNOSIS — Z90710 Acquired absence of both cervix and uterus: Secondary | ICD-10-CM

## 2019-07-03 NOTE — Progress Notes (Signed)
  Postoperative Follow-up Patient presents post op from laparoscopic assisted vaginal hysterectomy, anterior colporrhaphy and sling for fibroids and urinary incontinence, 3 weeks ago.  Subjective: Patient reports new onset bleeding today, like a period/lighter.  No pain.  No recent trauma, intercourse, infection, change in activity.  Objective: BP 130/80   Ht 5\' 4"  (1.626 m)   Wt 196 lb (88.9 kg)   BMI 33.64 kg/m  Physical Exam Constitutional:      General: She is not in acute distress.    Appearance: She is well-developed.  Genitourinary:     Pelvic exam was performed with patient supine.     Vagina and rectum normal.     No vaginal erythema or bleeding.     No right or left adnexal mass present.     Right adnexa not tender.     Left adnexa not tender.     Genitourinary Comments: Cervix and uterus absent. Vaginal cuff healing well, some blood present    No separation  Cardiovascular:     Rate and Rhythm: Normal rate.  Pulmonary:     Effort: Pulmonary effort is normal.  Abdominal:     General: There is no distension.     Palpations: Abdomen is soft.     Tenderness: There is no abdominal tenderness.     Comments: Incision healing well.  Musculoskeletal: Normal range of motion.  Neurological:     Mental Status: She is alert and oriented to person, place, and time.     Cranial Nerves: No cranial nerve deficit.  Skin:    General: Skin is warm and dry.    Assessment: s/p :  laparoscopic assisted vaginal hysterectomy, anterior colporrhaphy and sling with Post Op Bleeding from vagina, likely related to healing without evidence for dehiscence  Plan: Continue to monitor bleeding and for pain. Recheck as needed, monitor for dehiscence. Pelvic rest  Hoyt Koch 07/03/2019, 4:38 PM

## 2019-07-20 ENCOUNTER — Other Ambulatory Visit: Payer: Self-pay

## 2019-07-20 ENCOUNTER — Ambulatory Visit (INDEPENDENT_AMBULATORY_CARE_PROVIDER_SITE_OTHER): Payer: Commercial Managed Care - PPO | Admitting: Family Medicine

## 2019-07-20 ENCOUNTER — Encounter: Payer: Self-pay | Admitting: Family Medicine

## 2019-07-20 VITALS — BP 115/77 | HR 81 | Temp 96.8°F | Ht 64.0 in | Wt 197.6 lb

## 2019-07-20 DIAGNOSIS — Z0000 Encounter for general adult medical examination without abnormal findings: Secondary | ICD-10-CM

## 2019-07-20 DIAGNOSIS — E669 Obesity, unspecified: Secondary | ICD-10-CM

## 2019-07-20 DIAGNOSIS — F419 Anxiety disorder, unspecified: Secondary | ICD-10-CM | POA: Diagnosis not present

## 2019-07-20 DIAGNOSIS — I10 Essential (primary) hypertension: Secondary | ICD-10-CM

## 2019-07-20 DIAGNOSIS — Z6833 Body mass index (BMI) 33.0-33.9, adult: Secondary | ICD-10-CM

## 2019-07-20 DIAGNOSIS — E559 Vitamin D deficiency, unspecified: Secondary | ICD-10-CM

## 2019-07-20 MED ORDER — ALPRAZOLAM 1 MG PO TABS: 1.0000 mg | ORAL_TABLET | Freq: Every day | ORAL | 2 refills | Status: DC | PRN

## 2019-07-20 NOTE — Patient Instructions (Signed)
The CDC recommends two doses of Shingrix (the shingles vaccine) separated by 2 to 6 months for adults age 52 years and older. I recommend checking with your insurance plan regarding coverage for this vaccine.     See if your job has a record of a tetanus shot for you, if not, we could get you scheduled for a nurse visit for that   Preventive Care 22-23 Years Old, Female Preventive care refers to visits with your health care provider and lifestyle choices that can promote health and wellness. This includes:  A yearly physical exam. This may also be called an annual well check.  Regular dental visits and eye exams.  Immunizations.  Screening for certain conditions.  Healthy lifestyle choices, such as eating a healthy diet, getting regular exercise, not using drugs or products that contain nicotine and tobacco, and limiting alcohol use. What can I expect for my preventive care visit? Physical exam Your health care provider will check your:  Height and weight. This may be used to calculate body mass index (BMI), which tells if you are at a healthy weight.  Heart rate and blood pressure.  Skin for abnormal spots. Counseling Your health care provider may ask you questions about your:  Alcohol, tobacco, and drug use.  Emotional well-being.  Home and relationship well-being.  Sexual activity.  Eating habits.  Work and work Statistician.  Method of birth control.  Menstrual cycle.  Pregnancy history. What immunizations do I need?  Influenza (flu) vaccine  This is recommended every year. Tetanus, diphtheria, and pertussis (Tdap) vaccine  You may need a Td booster every 10 years. Varicella (chickenpox) vaccine  You may need this if you have not been vaccinated. Zoster (shingles) vaccine  You may need this after age 16. Measles, mumps, and rubella (MMR) vaccine  You may need at least one dose of MMR if you were born in 1957 or later. You may also need a second  dose. Pneumococcal conjugate (PCV13) vaccine  You may need this if you have certain conditions and were not previously vaccinated. Pneumococcal polysaccharide (PPSV23) vaccine  You may need one or two doses if you smoke cigarettes or if you have certain conditions. Meningococcal conjugate (MenACWY) vaccine  You may need this if you have certain conditions. Hepatitis A vaccine  You may need this if you have certain conditions or if you travel or work in places where you may be exposed to hepatitis A. Hepatitis B vaccine  You may need this if you have certain conditions or if you travel or work in places where you may be exposed to hepatitis B. Haemophilus influenzae type b (Hib) vaccine  You may need this if you have certain conditions. Human papillomavirus (HPV) vaccine  If recommended by your health care provider, you may need three doses over 6 months. You may receive vaccines as individual doses or as more than one vaccine together in one shot (combination vaccines). Talk with your health care provider about the risks and benefits of combination vaccines. What tests do I need? Blood tests  Lipid and cholesterol levels. These may be checked every 5 years, or more frequently if you are over 53 years old.  Hepatitis C test.  Hepatitis B test. Screening  Lung cancer screening. You may have this screening every year starting at age 66 if you have a 30-pack-year history of smoking and currently smoke or have quit within the past 15 years.  Colorectal cancer screening. All adults should have this  screening starting at age 33 and continuing until age 14. Your health care provider may recommend screening at age 70 if you are at increased risk. You will have tests every 1-10 years, depending on your results and the type of screening test.  Diabetes screening. This is done by checking your blood sugar (glucose) after you have not eaten for a while (fasting). You may have this done every  1-3 years.  Mammogram. This may be done every 1-2 years. Talk with your health care provider about when you should start having regular mammograms. This may depend on whether you have a family history of breast cancer.  BRCA-related cancer screening. This may be done if you have a family history of breast, ovarian, tubal, or peritoneal cancers.  Pelvic exam and Pap test. This may be done every 3 years starting at age 26. Starting at age 25, this may be done every 5 years if you have a Pap test in combination with an HPV test. Other tests  Sexually transmitted disease (STD) testing.  Bone density scan. This is done to screen for osteoporosis. You may have this scan if you are at high risk for osteoporosis. Follow these instructions at home: Eating and drinking  Eat a diet that includes fresh fruits and vegetables, whole grains, lean protein, and low-fat dairy.  Take vitamin and mineral supplements as recommended by your health care provider.  Do not drink alcohol if: ? Your health care provider tells you not to drink. ? You are pregnant, may be pregnant, or are planning to become pregnant.  If you drink alcohol: ? Limit how much you have to 0-1 drink a day. ? Be aware of how much alcohol is in your drink. In the [place] one drink equals one 12 oz bottle of beer (355 mL), one 5 oz glass of wine (148 mL), or one 1 oz glass of hard liquor (44 mL). Lifestyle  Take daily care of your teeth and gums.  Stay active. Exercise for at least 30 minutes on 5 or more days each week.  Do not use any products that contain nicotine or tobacco, such as cigarettes, e-cigarettes, and chewing tobacco. If you need help quitting, ask your health care provider.  If you are sexually active, practice safe sex. Use a condom or other form of birth control (contraception) in order to prevent pregnancy and STIs (sexually transmitted infections).  If told by your health care provider, take low-dose aspirin daily  starting at age 78. What's next?  Visit your health care provider once a year for a well check visit.  Ask your health care provider how often you should have your eyes and teeth checked.  Stay up to date on all vaccines. This information is not intended to replace advice given to you by your health care provider. Make sure you discuss any questions you have with your health care provider. Document Released: 10/31/2015 Document Revised: 06/15/2018 Document Reviewed: 06/15/2018 Elsevier Patient Education  2020 [employer].

## 2019-07-20 NOTE — Assessment & Plan Note (Signed)
Discussed importance of healthy weight management Discussed diet and exercise  

## 2019-07-20 NOTE — Assessment & Plan Note (Signed)
Well controlled Continue current medications Recheck metabolic panel F/u in 6 months  

## 2019-07-20 NOTE — Progress Notes (Signed)
Patient: Mackenzie Bailey, Female    DOB: [birth year], 53 y.o.   MRN: [id number] Visit Date: 07/20/2019  Today's Provider: Lavon Paganini, MD   Chief Complaint  Patient presents with  . Annual Exam   Subjective:    I, Mackenzie Bailey, CMA, am acting as a scribe for Lavon Paganini, MD.    Annual physical exam Mackenzie Bailey is a 53 y.o. female who presents today for health maintenance and complete physical. She feels well. She reports exercising lightly. She reports she is sleeping fairly well.  -----------------------------------------------------------------   Review of Systems  Constitutional: Positive for fatigue. Negative for activity change, appetite change, chills, diaphoresis, fever and unexpected weight change.  HENT: Negative.   Eyes: Negative.   Respiratory: Negative.   Cardiovascular: Negative.   Gastrointestinal: Positive for constipation and diarrhea. Negative for abdominal distention, abdominal pain, anal bleeding, blood in stool, nausea, rectal pain and vomiting.  Endocrine: Negative.   Genitourinary: Negative.   Musculoskeletal: Negative.   Skin: Negative.   Allergic/Immunologic: Negative.   Neurological: Negative.   Hematological: Negative.   Psychiatric/Behavioral: Negative for agitation, behavioral problems, confusion, decreased concentration, dysphoric mood, hallucinations, self-injury, sleep disturbance and suicidal ideas. The patient is nervous/anxious. The patient is not hyperactive.     Social History      She  reports that she has never smoked. She has never used smokeless tobacco. She reports current alcohol use of about 1.0 - 2.0 standard drinks of alcohol per week. She reports that she does not use drugs.       Social History   Socioeconomic History  . Marital status: Married    Spouse name: Phu  . Number of children: 1  . Years of education: 70  . Highest education level: Some college, no degree  Occupational History   Employer: [employer]  Social Needs  . Financial resource strain: Not hard at all  . Food insecurity    Worry: Never true    Inability: Never true  . Transportation needs    Medical: No    Non-medical: No  Tobacco Use  . Smoking status: Never Smoker  . Smokeless tobacco: Never Used  Substance and Sexual Activity  . Alcohol use: Yes    Alcohol/week: 1.0 - 2.0 standard drinks    Types: 1 - 2 Glasses of wine per week    Comment: OCC WINE  . Drug use: No  . Sexual activity: Yes    Partners: Male    Birth control/protection: I.U.D.    Comment: Mirena  Lifestyle  . Physical activity    Days per week: 0 days    Minutes per session: Not on file  . Stress: To some extent  Relationships  . Social connections    Talks on phone: More than three times a week    Gets together: Once a week    Attends religious service: 1 to 4 times per year    Active member of club or organization: No    Attends meetings of clubs or organizations: Never    Relationship status: Married  Other Topics Concern  . Not on file  Social History Narrative  . Not on file    Past Medical History:  Diagnosis Date  . Allergy   . Anxiety   . Asthma    SEASONAL   . Complication of anesthesia   . Female cystocele   . GERD (gastroesophageal reflux disease)    OCC  . Headache  RARE MIGRAINES  . Heart murmur    ASYMPTOMATIC  . Hypertension   . Menorrhagia   . Mitral valve prolapse   . PONV (postoperative nausea and vomiting)    WITH FRACTURE SURGERY ONLY  . SUI (stress urinary incontinence, female)   . Vaginal pessary present      Patient Active Problem List   Diagnosis Date Noted  . S/P laparoscopic assisted vaginal hysterectomy (LAVH) 06/14/2019  . Urinary incontinence in female 05/16/2019  . Menorrhagia with regular cycle 05/16/2019  . Adjustment disorder with mixed anxiety and depressed mood 04/16/2019  . Insomnia 01/01/2019  . Has daytime drowsiness 01/01/2019  . Benign neoplasm  of ascending colon   . Benign neoplasm of transverse colon   . Family history of melanoma 01/02/2018  . Obesity 01/02/2018  . Heme positive stool 12/05/2017  . Special screening for malignant neoplasms, colon 12/05/2017  . Allergic rhinitis 09/25/2015  . Asthma 09/25/2015  . Cardiac murmur 09/25/2015  . Essential hypertension 09/25/2015  . Anxiety 09/25/2015    Past Surgical History:  Procedure Laterality Date  . BREAST BIOPSY Right 2012?   benign  . BREAST SURGERY     benign biobsy  . COLONOSCOPY WITH PROPOFOL N/A 02/24/2018   Procedure: COLONOSCOPY WITH PROPOFOL;  Surgeon: Lucilla Lame, MD;  Location: [place];  Service: Endoscopy;  Laterality: N/A;  . CYSTOCELE REPAIR N/A 06/14/2019   Procedure: ANTERIOR REPAIR (CYSTOCELE);  Surgeon: Gae Dry, MD;  Location: [facility];  Service: Gynecology;  Laterality: N/A;  . CYSTOSCOPY N/A 06/14/2019   Procedure: CYSTOSCOPY;  Surgeon: Gae Dry, MD;  Location: [facility];  Service: Gynecology;  Laterality: N/A;  . FRACTURE SURGERY     R lower leg  . IUD REMOVAL  06/14/2019   Procedure: INTRAUTERINE DEVICE (IUD) REMOVAL;  Surgeon: Gae Dry, MD;  Location: [facility];  Service: Gynecology;;  . LAPAROSCOPIC VAGINAL HYSTERECTOMY WITH SALPINGECTOMY Bilateral 06/14/2019   Procedure: LAPAROSCOPIC ASSISTED VAGINAL HYSTERECTOMY WITH BILATERAL SALPINGECTOMY;  Surgeon: Gae Dry, MD;  Location: [facility];  Service: Gynecology;  Laterality: Bilateral;  . POLYPECTOMY     uterine, found on sonohystogram, contributed to menorrhagia   . POLYPECTOMY  02/24/2018   Procedure: POLYPECTOMY;  Surgeon: Lucilla Lame, MD;  Location: [place];  Service: Endoscopy;;  . PUBOVAGINAL SLING N/A 06/14/2019   Procedure: Gaynelle Arabian;  Surgeon: Gae Dry, MD;  Location: [facility];  Service: Gynecology;  Laterality: N/A;    Family History        Family Status  Relation Name Status  . Father  Alive  . Sister  Alive  .  MGF  Deceased  . PGM  Deceased  . PGF  Deceased  . Mother  Alive  . Son  Alive  . MGM  Deceased  . Neg Hx  (Not Specified)        Her family history includes Arthritis in her father and paternal grandmother; Asthma in her father; Cancer in her paternal grandmother; Early death in her maternal grandfather; Heart attack in her maternal grandfather; Hypertension in her mother; Skin cancer in her father; Stroke in her paternal grandfather; Thyroid disease in her sister; Uterine cancer in her sister. There is no history of Colon cancer or Breast cancer.      Allergies  Allergen Reactions  . Codeine Nausea And Vomiting  . Penicillins Hives    Did it involve swelling of the face/tongue/throat, SOB, or low BP? No Did it involve sudden or  severe rash/hives, skin peeling, or any reaction on the inside of your mouth or nose? No Did you need to seek medical attention at a hospital or doctor's office? No When did it last happen?Childhood allergy If all above answers are "NO", may proceed with cephalosporin use.      Current Outpatient Medications:  .  albuterol (PROVENTIL) (2.5 MG/3ML) 0.083% nebulizer solution, Take 3 mLs (2.5 mg total) by nebulization every 6 (six) hours as needed for wheezing or shortness of breath. (Patient taking differently: Take 2.5 mg by nebulization every 6 (six) hours as needed for wheezing or shortness of breath. Has not used in years), Disp: 75 mL, Rfl: 12 .  ALPRAZolam (XANAX) 1 MG tablet, Take 1 tablet (1 mg total) by mouth daily as needed for anxiety., Disp: 30 tablet, Rfl: 0 .  amLODipine (NORVASC) 5 MG tablet, Take 1 tablet (5 mg total) by mouth daily. (Patient taking differently: Take 5 mg by mouth every morning. ), Disp: 90 tablet, Rfl: 3 .  aspirin-acetaminophen-caffeine (EXCEDRIN MIGRAINE) 250-250-65 MG tablet, Take 2 tablets by mouth every 6 (six) hours as needed for headache., Disp: , Rfl:  .  buPROPion (WELLBUTRIN) 75 MG tablet, Take 75 mg by mouth 2  (two) times daily., Disp: , Rfl:  .  Cholecalciferol (VITAMIN D) 50 MCG (2000 UT) tablet, Take 2,000 Units by mouth daily., Disp: , Rfl:  .  citalopram (CELEXA) 40 MG tablet, Take 1 tablet (40 mg total) by mouth daily. (Patient taking differently: Take 40 mg by mouth every evening. ), Disp: 90 tablet, Rfl: 3 .  diphenhydramine-acetaminophen (TYLENOL PM) 25-500 MG TABS tablet, Take 1-2 tablets by mouth at bedtime as needed (sleep)., Disp: , Rfl:  .  ibuprofen (ADVIL) 200 MG tablet, Take 400 mg by mouth every 6 (six) hours as needed for headache or moderate pain., Disp: , Rfl:  .  montelukast (SINGULAIR) 10 MG tablet, Take 1 tablet (10 mg total) by mouth at bedtime., Disp: 90 tablet, Rfl: 3 .  omeprazole (PRILOSEC) 20 MG capsule, Take 20 mg by mouth as needed., Disp: , Rfl:  .  oxybutynin (DITROPAN XL) 10 MG 24 hr tablet, Take 1 tablet (10 mg total) by mouth daily., Disp: 90 tablet, Rfl: 0 .  Pseudoeph-Doxylamine-DM-APAP (NYQUIL PO), Take 1 Dose by mouth at bedtime as needed (sleep)., Disp: , Rfl:  .  traZODone (DESYREL) 50 MG tablet, TAKE 0.5-1 TABLETS (25-50 MG TOTAL) BY MOUTH AT BEDTIME AS NEEDED FOR SLEEP., Disp: 90 tablet, Rfl: 2 .  Vitamin D, Ergocalciferol, (DRISDOL) 1.25 MG (50000 UT) CAPS capsule, TAKE 1 CAPSULE (50,000 UNITS TOTAL) BY MOUTH EVERY 7 (SEVEN) DAYS., Disp: 12 capsule, Rfl: 0   Patient Care Team: Virginia Crews, MD as PCP - General (Family Medicine)    Objective:    Vitals: BP 115/77 (BP Location: Right Arm, Patient Position: Sitting, Cuff Size: Normal)   Pulse 81   Temp (!) 96.8 F (36 C) (Temporal)   Ht 5\' 4"  (1.626 m)   Wt 197 lb 9.6 oz (89.6 kg)   LMP 05/09/2019 (Exact Date)   BMI 33.92 kg/m    Vitals:   07/20/19 1111  BP: 115/77  Pulse: 81  Temp: (!) 96.8 F (36 C)  TempSrc: Temporal  Weight: 197 lb 9.6 oz (89.6 kg)  Height: 5\' 4"  (1.626 m)     Physical Exam Vitals signs reviewed.  Constitutional:      General: She is not in acute distress.     Appearance: Normal  appearance. She is well-developed. She is not diaphoretic.  HENT:     Head: Normocephalic and atraumatic.     Right Ear: Tympanic membrane, ear canal and external ear normal.     Left Ear: Tympanic membrane, ear canal and external ear normal.  Eyes:     General: No scleral icterus.    Conjunctiva/sclera: Conjunctivae normal.     Pupils: Pupils are equal, round, and reactive to light.  Neck:     Musculoskeletal: Neck supple.     Thyroid: No thyromegaly.  Cardiovascular:     Rate and Rhythm: Normal rate and regular rhythm.     Pulses: Normal pulses.     Heart sounds: Normal heart sounds. No murmur.  Pulmonary:     Effort: Pulmonary effort is normal. No respiratory distress.     Breath sounds: Normal breath sounds. No wheezing or rales.  Abdominal:     General: There is no distension.     Palpations: Abdomen is soft.     Tenderness: There is no abdominal tenderness.  Musculoskeletal:        General: No deformity.     Right lower leg: No edema.     Left lower leg: No edema.  Lymphadenopathy:     Cervical: No cervical adenopathy.  Skin:    General: Skin is warm and dry.     Capillary Refill: Capillary refill takes less than 2 seconds.     Findings: No rash.  Neurological:     Mental Status: She is alert and oriented to person, place, and time. Mental status is at baseline.  Psychiatric:        Mood and Affect: Mood normal.        Behavior: Behavior normal.        Thought Content: Thought content normal.      Depression Screen PHQ 2/9 Scores 04/16/2019 06/09/2018 03/27/2018 01/02/2018  PHQ - 2 Score 6 2 0 0  PHQ- 9 Score 18 4 3  -       Assessment & Plan:     Routine Health Maintenance and Physical Exam  Exercise Activities and Dietary recommendations Goals   None     Immunization History  Administered Date(s) Administered  . Influenza,inj,Quad PF,6+ Mos 08/13/2013  . Influenza-Unspecified 08/16/2018    Health Maintenance  Topic Date Due   . HIV Screening  08/17/1982  . TETANUS/TDAP  08/17/1986  . INFLUENZA VACCINE  05/19/2019  . PAP SMEAR-Modifier  08/03/2019  . MAMMOGRAM  04/16/2021  . COLONOSCOPY  02/25/2023     Discussed health benefits of physical activity, and encouraged her to engage in regular exercise appropriate for her age and condition.    --------------------------------------------------------------------  Problem List Items Addressed This Visit      Cardiovascular and Mediastinum   Essential hypertension    Well controlled Continue current medications Recheck metabolic panel F/u in 6 months         Other   Anxiety    Chronic and stable Continue Celexa 40 mg daily Continue xanax sparingly      Relevant Medications   ALPRAZolam (XANAX) 1 MG tablet   Obesity    Discussed importance of healthy weight management Discussed diet and exercise       Relevant Orders   Hepatic function panel    Other Visit Diagnoses    Encounter for annual physical exam    -  Primary   Relevant Orders   Hepatic function panel   Lipid panel   VITAMIN D  25 Hydroxy (Vit-D Deficiency, Fractures)   Avitaminosis D       Relevant Orders   VITAMIN D 25 Hydroxy (Vit-D Deficiency, Fractures)       Return in about 6 months (around 01/18/2020) for chronic disease f/u.   The entirety of the information documented in the History of Present Illness, Review of Systems and Physical Exam were personally obtained by me. Portions of this information were initially documented by Mackenzie Bailey, CMA and reviewed by me for thoroughness and accuracy.    Bacigalupo, Dionne Bucy, MD MPH [facility]

## 2019-07-20 NOTE — Assessment & Plan Note (Signed)
Chronic and stable Continue Celexa 40 mg daily Continue xanax sparingly

## 2019-07-26 ENCOUNTER — Other Ambulatory Visit: Payer: Self-pay | Admitting: Obstetrics & Gynecology

## 2019-07-26 ENCOUNTER — Telehealth: Payer: Self-pay

## 2019-07-26 ENCOUNTER — Ambulatory Visit: Payer: Managed Care, Other (non HMO) | Admitting: Obstetrics & Gynecology

## 2019-07-26 MED ORDER — SULFAMETHOXAZOLE-TRIMETHOPRIM 800-160 MG PO TABS: 1.0000 | ORAL_TABLET | Freq: Two times a day (BID) | ORAL | 0 refills | Status: AC

## 2019-07-26 NOTE — Telephone Encounter (Signed)
No Rx, but she should try Miralax or similar therapy for constipation.

## 2019-07-26 NOTE — Telephone Encounter (Signed)
Pt aware.

## 2019-07-26 NOTE — Telephone Encounter (Signed)
Pt states she finally had a bm and when she wiped she had some stool in her vagina, she states she is going to have a uti now, very prone to them, can you send in a rx for UTI.

## 2019-07-26 NOTE — Telephone Encounter (Signed)
Pt rescheduled her appointment because of constipation, she's asking if something can be called in for her, she has tried a suppository and it didn't help,

## 2019-07-26 NOTE — Telephone Encounter (Signed)
Pt reports she didn't take any pain med's, not sure why she's constipated, but will try OTC medications

## 2019-07-31 ENCOUNTER — Other Ambulatory Visit: Payer: Self-pay

## 2019-07-31 ENCOUNTER — Ambulatory Visit (INDEPENDENT_AMBULATORY_CARE_PROVIDER_SITE_OTHER): Payer: Commercial Managed Care - PPO | Admitting: Obstetrics & Gynecology

## 2019-07-31 ENCOUNTER — Encounter: Payer: Self-pay | Admitting: Obstetrics & Gynecology

## 2019-07-31 VITALS — BP 130/88 | Ht 64.0 in | Wt 198.0 lb

## 2019-07-31 DIAGNOSIS — R32 Unspecified urinary incontinence: Secondary | ICD-10-CM

## 2019-07-31 DIAGNOSIS — Z90710 Acquired absence of both cervix and uterus: Secondary | ICD-10-CM

## 2019-07-31 NOTE — Progress Notes (Signed)
  Postoperative Follow-up Patient presents post op from laparoscopic assisted vaginal hysterectomy, anterior colporrhaphy and sling for fibroids and urinary incontinence, 6 weeks ago.  Subjective: Patient reports marked improvement in her preop symptoms especially as it pertains to her bladder incontinence. Eating a regular diet without difficulty. The patient is not having any pain.  Activity: normal activities of daily living. Patient reports additional symptom's since surgery of None.  Objective: BP 130/88   Ht 5\' 4"  (1.626 m)   Wt 198 lb (89.8 kg)   LMP 05/09/2019 (Exact Date)   BMI 33.99 kg/m  Physical Exam Constitutional:      General: She is not in acute distress.    Appearance: She is well-developed.  Genitourinary:     Pelvic exam was performed with patient supine.     Vagina and rectum normal.     No vaginal erythema or bleeding.     No right or left adnexal mass present.     Right adnexa not tender.     Left adnexa not tender.     Genitourinary Comments: Cervix and uterus absent. Vaginal cuff healing well.  Cardiovascular:     Rate and Rhythm: Normal rate.  Pulmonary:     Effort: Pulmonary effort is normal.  Abdominal:     General: There is no distension.     Palpations: Abdomen is soft.     Tenderness: There is no abdominal tenderness.     Comments: Incision healing well.  Musculoskeletal: Normal range of motion.  Neurological:     Mental Status: She is alert and oriented to person, place, and time.     Cranial Nerves: No cranial nerve deficit.  Skin:    General: Skin is warm and dry.     Assessment: s/p :  laparoscopic assisted vaginal hysterectomy, anterior colporrhaphy and sling progressing well  Plan: Patient has done well after surgery with no apparent complications.  I have discussed the post-operative course to date, and the expected progress moving forward.  The patient understands what complications to be concerned about.  I will see the patient in  routine follow up, or sooner if needed.    Activity plan: No restriction.  Hoyt Koch 07/31/2019, 4:44 PM

## 2019-08-09 ENCOUNTER — Other Ambulatory Visit: Payer: Self-pay | Admitting: Obstetrics & Gynecology

## 2019-09-25 ENCOUNTER — Other Ambulatory Visit: Payer: Self-pay | Admitting: Family Medicine

## 2019-09-27 ENCOUNTER — Other Ambulatory Visit: Payer: Self-pay | Admitting: Family Medicine

## 2019-09-27 DIAGNOSIS — I10 Essential (primary) hypertension: Secondary | ICD-10-CM

## 2019-09-27 MED ORDER — AMLODIPINE BESYLATE 5 MG PO TABS: 5.0000 mg | ORAL_TABLET | ORAL | 2 refills | Status: DC

## 2019-09-27 NOTE — Telephone Encounter (Signed)
Medication Refill - Medication: amLODipine (NORVASC) 5 MG tablet [ Pt has 4 pills left   Has the patient contacted their pharmacy? Yes.   (Agent: If no, request that the patient contact the pharmacy for the refill.) (Agent: If yes, when and what did the pharmacy advise?)  Preferred Pharmacy (with phone number or street name):  [id number] [place] [address] Phone:  [phone]  Fax:  [phone]       Agent: Please be advised that RX refills may take up to 3 business days. We ask that you follow-up with your pharmacy.

## 2019-09-27 NOTE — Telephone Encounter (Signed)
From [facility] 

## 2019-11-01 ENCOUNTER — Ambulatory Visit: Payer: Commercial Managed Care - PPO | Admitting: Family Medicine

## 2019-11-06 ENCOUNTER — Encounter: Payer: Self-pay | Admitting: Family Medicine

## 2019-11-06 ENCOUNTER — Other Ambulatory Visit: Payer: Self-pay

## 2019-11-06 ENCOUNTER — Ambulatory Visit (INDEPENDENT_AMBULATORY_CARE_PROVIDER_SITE_OTHER): Payer: Commercial Managed Care - PPO | Admitting: Family Medicine

## 2019-11-06 VITALS — BP 151/84 | HR 69 | Temp 96.8°F | Resp 16 | Wt 201.8 lb

## 2019-11-06 DIAGNOSIS — R61 Generalized hyperhidrosis: Secondary | ICD-10-CM | POA: Diagnosis not present

## 2019-11-06 DIAGNOSIS — M546 Pain in thoracic spine: Secondary | ICD-10-CM | POA: Diagnosis not present

## 2019-11-06 DIAGNOSIS — R1011 Right upper quadrant pain: Secondary | ICD-10-CM | POA: Diagnosis not present

## 2019-11-06 MED ORDER — CYCLOBENZAPRINE HCL 5 MG PO TABS: 5.0000 mg | ORAL_TABLET | Freq: Three times a day (TID) | ORAL | 1 refills | Status: DC | PRN

## 2019-11-06 NOTE — Patient Instructions (Signed)

## 2019-11-06 NOTE — Progress Notes (Signed)
Patient: Mackenzie Bailey Female    DOB: 1966-09-09   54 y.o.   MRN: [id number] Visit Date: 11/06/2019  Today's Provider: Lavon Paganini, MD   Chief Complaint  Patient presents with  . Back Pain   Subjective:     HPI Patient C/O back pain on and off in the last few days. Patient reports pain radiates to her bra area line. Patient denies any nausea, vomiting, diarrhea, or constipation. Patient concerned it maybe gallstone.   Had her first COVID Vaccine last week.  The day after she had back pain in thoracic area that radiated to stomach area.  Thought it was a vaccine side effect. Took motrin and it helped.  Felt nauseated with it and diaphoretic with episode yesterday.  Took Motrin and gasx and pain relieved immediately.  Unsure if it is associated with eating.  Denies CP, SOB, fever, cough.  Allergies  Allergen Reactions  . Codeine Nausea And Vomiting  . Penicillins Hives    Did it involve swelling of the face/tongue/throat, SOB, or low BP? No Did it involve sudden or severe rash/hives, skin peeling, or any reaction on the inside of your mouth or nose? No Did you need to seek medical attention at a hospital or doctor's office? No When did it last happen?Childhood allergy If all above answers are "NO", may proceed with cephalosporin use.      Current Outpatient Medications:  .  albuterol (PROVENTIL) (2.5 MG/3ML) 0.083% nebulizer solution, Take 3 mLs (2.5 mg total) by nebulization every 6 (six) hours as needed for wheezing or shortness of breath. (Patient taking differently: Take 2.5 mg by nebulization every 6 (six) hours as needed for wheezing or shortness of breath. Has not used in years), Disp: 75 mL, Rfl: 12 .  ALPRAZolam (XANAX) 1 MG tablet, Take 1 tablet (1 mg total) by mouth daily as needed for anxiety., Disp: 30 tablet, Rfl: 2 .  amLODipine (NORVASC) 5 MG tablet, Take 1 tablet (5 mg total) by mouth every morning., Disp: 90 tablet, Rfl: 2 .  buPROPion  (WELLBUTRIN) 75 MG tablet, Take 75 mg by mouth 2 (two) times daily., Disp: , Rfl:  .  citalopram (CELEXA) 40 MG tablet, Take 1 tablet (40 mg total) by mouth daily. (Patient taking differently: Take 40 mg by mouth every evening. ), Disp: 90 tablet, Rfl: 3 .  diphenhydramine-acetaminophen (TYLENOL PM) 25-500 MG TABS tablet, Take 1-2 tablets by mouth at bedtime as needed (sleep)., Disp: , Rfl:  .  ibuprofen (ADVIL) 200 MG tablet, Take 400 mg by mouth every 6 (six) hours as needed for headache or moderate pain., Disp: , Rfl:  .  montelukast (SINGULAIR) 10 MG tablet, Take 1 tablet (10 mg total) by mouth at bedtime., Disp: 90 tablet, Rfl: 3 .  omeprazole (PRILOSEC) 20 MG capsule, Take 20 mg by mouth as needed., Disp: , Rfl:  .  Pseudoeph-Doxylamine-DM-APAP (NYQUIL PO), Take 1 Dose by mouth at bedtime as needed (sleep)., Disp: , Rfl:  .  traZODone (DESYREL) 50 MG tablet, TAKE 1/2 TO 1 TABLET BY MOUTH AT BEDTIME AS NEEDED FOR SLEEP, Disp: 90 tablet, Rfl: 0 .  aspirin-acetaminophen-caffeine (EXCEDRIN MIGRAINE) 250-250-65 MG tablet, Take 2 tablets by mouth every 6 (six) hours as needed for headache., Disp: , Rfl:   Review of Systems  Constitutional: Negative for appetite change, chills and fever.  Respiratory: Positive for cough and shortness of breath.   Gastrointestinal: Positive for abdominal distention. Negative for blood in stool, constipation,  diarrhea, nausea and vomiting.  Genitourinary: Negative for hematuria.    Social History   Tobacco Use  . Smoking status: Never Smoker  . Smokeless tobacco: Never Used  Substance Use Topics  . Alcohol use: Yes    Alcohol/week: 1.0 - 2.0 standard drinks    Types: 1 - 2 Glasses of wine per week    Comment: OCC WINE      Objective:   BP (!) 149/94 (BP Location: Left Arm, Patient Position: Sitting, Cuff Size: Large)   Pulse 69   Temp (!) 96.8 F (36 C) (Temporal)   Resp 16   Wt 201 lb 12.8 oz (91.5 kg)   LMP 05/09/2019 (Exact Date)   BMI 34.64  kg/m  Vitals:   11/06/19 1056  BP: (!) 149/94  Pulse: 69  Resp: 16  Temp: (!) 96.8 F (36 C)  TempSrc: Temporal  Weight: 201 lb 12.8 oz (91.5 kg)  Body mass index is 34.64 kg/m.   Physical Exam Vitals reviewed.  Constitutional:      General: She is not in acute distress.    Appearance: Normal appearance. She is well-developed. She is not diaphoretic.  HENT:     Head: Normocephalic and atraumatic.  Eyes:     General: No scleral icterus.    Conjunctiva/sclera: Conjunctivae normal.  Neck:     Thyroid: No thyromegaly.  Cardiovascular:     Rate and Rhythm: Normal rate and regular rhythm.     Pulses: Normal pulses.     Heart sounds: Normal heart sounds. No murmur.  Pulmonary:     Effort: Pulmonary effort is normal. No respiratory distress.     Breath sounds: Normal breath sounds. No wheezing, rhonchi or rales.  Abdominal:     General: Bowel sounds are normal. There is no distension.     Palpations: Abdomen is soft.     Tenderness: There is no abdominal tenderness. There is no right CVA tenderness, left CVA tenderness, guarding or rebound. Negative signs include Murphy's sign.  Musculoskeletal:     Cervical back: Neck supple.     Right lower leg: No edema.     Left lower leg: No edema.     Comments: Back: No midline or paraspinal muscular tenderness to palpation.  Some tightness of thoracic paraspinal musculature bilaterally.  Strength of lower extremities and sensation of lower extremities is intact and symmetric.  Gait is intact.  Lymphadenopathy:     Cervical: No cervical adenopathy.  Skin:    General: Skin is warm and dry.     Capillary Refill: Capillary refill takes less than 2 seconds.     Findings: No rash.  Neurological:     Mental Status: She is alert and oriented to person, place, and time. Mental status is at baseline.  Psychiatric:        Mood and Affect: Mood normal.        Behavior: Behavior normal.      No results found for any visits on 11/06/19.      Assessment & Plan    1. Acute bilateral thoracic back pain 2. Colicky RUQ abdominal pain 3. Diaphoresis -New problem that is colicky in nature -She has had 2 episodes associated with pain that starts in her thoracic back bilaterally and radiates around both sides into her upper abdomen -She does notice some prominence in her right upper quadrant -Episodes are associated with diaphoresis and nausea -Unclear if they are associated with eating -The started after getting her first dose of  her COVID-19 vaccine -Discussed that her myalgias and back pain may be a vaccine side effect, but the diaphoresis and chills and episodic nature of the pain are unlikely to be related -She may have some muscle cramping/spasm this causing the radiating pain, so we will try Flexeril as needed -The nausea and right upper quadrant pain that is colicky in nature has some components of biliary colic, so we will obtain CBC, CMP, and right upper quadrant ultrasound to evaluate further -She does not have any chest pain, but we did discuss how women often have atypical chest pain -I will check troponin just to ensure that yesterday's episode was not cardiac in nature -I did discuss ER/return precautions for any chest pain or shortness of breath associated with the episodes -Follow-up and further management pending test results from today - US Abdomen Limited RUQ; Future - CBC - Comprehensive metabolic panel - Troponin I    Meds ordered this encounter  Medications  . cyclobenzaprine (FLEXERIL) 5 MG tablet    Sig: Take 1 tablet (5 mg total) by mouth 3 (three) times daily as needed for muscle spasms.    Dispense:  30 tablet    Refill:  1     Return if symptoms worsen or fail to improve.   The entirety of the information documented in the History of Present Illness, Review of Systems and Physical Exam were personally obtained by me. Portions of this information were initially documented by Lynford Humphrey, CMA  and reviewed by me for thoroughness and accuracy.    Tylek Boney, Dionne Bucy, MD MPH [facility]

## 2019-11-07 ENCOUNTER — Other Ambulatory Visit: Payer: Self-pay | Admitting: Obstetrics & Gynecology

## 2019-11-07 ENCOUNTER — Telehealth: Payer: Self-pay

## 2019-11-07 LAB — CBC
Hematocrit: 40 % (ref 34.0–46.6)
Hemoglobin: 12.6 g/dL (ref 11.1–15.9)
MCH: 24.9 pg — ABNORMAL LOW (ref 26.6–33.0)
MCHC: 31.5 g/dL (ref 31.5–35.7)
MCV: 79 fL (ref 79–97)
Platelets: 288 10*3/uL (ref 150–450)
RBC: 5.06 x10E6/uL (ref 3.77–5.28)
RDW: 14.8 % (ref 11.7–15.4)
WBC: 6.8 10*3/uL (ref 3.4–10.8)

## 2019-11-07 LAB — COMPREHENSIVE METABOLIC PANEL
ALT: 31 IU/L (ref 0–32)
AST: 25 IU/L (ref 0–40)
Albumin/Globulin Ratio: 1.6 (ref 1.2–2.2)
Albumin: 4.1 g/dL (ref 3.8–4.9)
Alkaline Phosphatase: 106 IU/L (ref 39–117)
BUN/Creatinine Ratio: 13 (ref 9–23)
BUN: 9 mg/dL (ref 6–24)
Bilirubin Total: 0.4 mg/dL (ref 0.0–1.2)
CO2: 22 mmol/L (ref 20–29)
Calcium: 9.5 mg/dL (ref 8.7–10.2)
Chloride: 97 mmol/L (ref 96–106)
Creatinine, Ser: 0.69 mg/dL (ref 0.57–1.00)
GFR calc Af Amer: 116 mL/min/{1.73_m2} (ref >59)
GFR calc non Af Amer: 100 mL/min/{1.73_m2} (ref >59)
Globulin, Total: 2.5 g/dL (ref 1.5–4.5)
Glucose: 88 mg/dL (ref 65–99)
Potassium: 3.9 mmol/L (ref 3.5–5.2)
Sodium: 139 mmol/L (ref 134–144)
Total Protein: 6.6 g/dL (ref 6.0–8.5)

## 2019-11-07 LAB — TROPONIN I: Troponin I: <0.01 ng/mL (ref 0.00–0.04)

## 2019-11-07 NOTE — Telephone Encounter (Signed)
Called pt to give lab results, no answer. Left detailed voice message.

## 2019-11-07 NOTE — Telephone Encounter (Signed)
-----   Message from Virginia Crews, MD sent at 11/07/2019  2:20 PM EST ----- Normal labs

## 2019-11-13 ENCOUNTER — Telehealth: Payer: Self-pay

## 2019-11-13 ENCOUNTER — Ambulatory Visit
Admission: RE | Admit: 2019-11-13 | Discharge: 2019-11-13 | Disposition: A | Discharge status: Home / Self Care | Payer: Commercial Managed Care - PPO | Source: Ambulatory Visit | Attending: Family Medicine | Admitting: Family Medicine

## 2019-11-13 ENCOUNTER — Other Ambulatory Visit: Payer: Self-pay

## 2019-11-13 DIAGNOSIS — R1011 Right upper quadrant pain: Secondary | ICD-10-CM

## 2019-11-13 DIAGNOSIS — K8020 Calculus of gallbladder without cholecystitis without obstruction: Secondary | ICD-10-CM

## 2019-11-13 NOTE — Telephone Encounter (Signed)
-----   Message from Virginia Crews, MD sent at 11/13/2019 10:23 AM EST ----- Ultrasound does show gallbladder stones with no evidence of infection or duct obstruction.  I do recommend referral to general surgeon to consider surgical options to treat her symptomatic gallstones.

## 2019-11-13 NOTE — Telephone Encounter (Signed)
Pt advised.  She agreed to proceed with the referral.   Thanks,   -Jermal Dismuke  

## 2019-11-15 ENCOUNTER — Ambulatory Visit: Payer: Self-pay | Admitting: Surgery

## 2019-11-15 ENCOUNTER — Ambulatory Visit (INDEPENDENT_AMBULATORY_CARE_PROVIDER_SITE_OTHER): Payer: Commercial Managed Care - PPO | Admitting: Surgery

## 2019-11-15 ENCOUNTER — Other Ambulatory Visit: Payer: Self-pay

## 2019-11-15 ENCOUNTER — Encounter: Payer: Self-pay | Admitting: Surgery

## 2019-11-15 VITALS — BP 134/82 | HR 82 | Temp 97.5°F | Resp 12 | Ht 64.0 in | Wt 202.4 lb

## 2019-11-15 DIAGNOSIS — K8010 Calculus of gallbladder with chronic cholecystitis without obstruction: Secondary | ICD-10-CM

## 2019-11-15 NOTE — Patient Instructions (Addendum)
You have requested to have your gallbladder removed. This will be done at [facility] with Dr. Christian Mate.  Our surgery scheduler will call you to schedule your surgery and go over other information.   You will most likely be out of work 1-2 weeks for this surgery. You will return after your post-op appointment with a lifting restriction for approximately 4 more weeks.  You will be able to eat anything you would like to following surgery. But, start by eating a bland diet and advance this as tolerated. The Gallbladder diet is below, please go as closely by this diet as possible prior to surgery to avoid any further attacks.  Please see the (blue)pre-care form that you have been given today. If you have any questions, please call our office.  Laparoscopic Cholecystectomy Laparoscopic cholecystectomy is surgery to remove the gallbladder. The gallbladder is located in the upper right part of the abdomen, behind the liver. It is a storage sac for bile, which is produced in the liver. Bile aids in the digestion and absorption of fats. Cholecystectomy is often done for inflammation of the gallbladder (cholecystitis). This condition is usually caused by a buildup of gallstones (cholelithiasis) in the gallbladder. Gallstones can block the flow of bile, and that can result in inflammation and pain. In severe cases, emergency surgery may be required. If emergency surgery is not required, you will have time to prepare for the procedure. Laparoscopic surgery is an alternative to open surgery. Laparoscopic surgery has a shorter recovery time. Your common bile duct may also need to be examined during the procedure. If stones are found in the common bile duct, they may be removed. LET [facility] CARE PROVIDER KNOW ABOUT:  Any allergies you have.  All medicines you are taking, including vitamins, herbs, eye drops, creams, and over-the-counter medicines.  Previous problems you or members of your family have  had with the use of anesthetics.  Any blood disorders you have.  Previous surgeries you have had.    Any medical conditions you have. RISKS AND COMPLICATIONS Generally, this is a safe procedure. However, problems may occur, including:  Infection.  Bleeding.  Allergic reactions to medicines.  Damage to other structures or organs.  A stone remaining in the common bile duct.  A bile leak from the cyst duct that is clipped when your gallbladder is removed.  The need to convert to open surgery, which requires a larger incision in the abdomen. This may be necessary if your surgeon thinks that it is not safe to continue with a laparoscopic procedure. BEFORE THE PROCEDURE  Ask your health care provider about:  Changing or stopping your regular medicines. This is especially important if you are taking diabetes medicines or blood thinners.  Taking medicines such as aspirin and ibuprofen. These medicines can thin your blood. Do not take these medicines before your procedure if your health care provider instructs you not to.  Follow instructions from your health care provider about eating or drinking restrictions.  Let your health care provider know if you develop a cold or an infection before surgery.  Plan to have someone take you home after the procedure.  Ask your health care provider how your surgical site will be marked or identified.  You may be given antibiotic medicine to help prevent infection. PROCEDURE  To reduce your risk of infection:  Your health care team will wash or sanitize their hands.  Your skin will be washed with soap.  An IV tube may be  inserted into one of your veins.  You will be given a medicine to make you fall asleep (general anesthetic).  A breathing tube will be placed in your mouth.  The surgeon will make several small cuts (incisions) in your abdomen.  A thin, lighted tube (laparoscope) that has a tiny camera on the end will be inserted  through one of the small incisions. The camera on the laparoscope will send a picture to a TV screen (monitor) in the operating room. This will give the surgeon a good view inside your abdomen.  A gas will be pumped into your abdomen. This will expand your abdomen to give the surgeon more room to perform the surgery.  Other tools that are needed for the procedure will be inserted through the other incisions. The gallbladder will be removed through one of the incisions.  After your gallbladder has been removed, the incisions will be closed with stitches (sutures), staples, or skin glue.  Your incisions may be covered with a bandage (dressing). The procedure may vary among health care providers and hospitals. AFTER THE PROCEDURE  Your blood pressure, heart rate, breathing rate, and blood oxygen level will be monitored often until the medicines you were given have worn off.  You will be given medicines as needed to control your pain.   This information is not intended to replace advice given to you by your health care provider. Make sure you discuss any questions you have with your health care provider.   Document Released: 10/04/2005 Document Revised: 06/25/2015 Document Reviewed: 05/16/2013 Elsevier Interactive Patient Education 2016 [place] Diet for Gallbladder Conditions A low-fat diet can be helpful if you have pancreatitis or a gallbladder condition. With these conditions, your pancreas and gallbladder have trouble digesting fats. A healthy eating plan with less fat will help rest your pancreas and gallbladder and reduce your symptoms. WHAT DO I NEED TO KNOW ABOUT THIS DIET?  Eat a low-fat diet.  Reduce your fat intake to less than 20-30% of your total daily calories. This is less than 50-60 g of fat per day.  Remember that you need some fat in your diet. Ask your dietician what your daily goal should be.  Choose nonfat and low-fat healthy foods. Look for the words  "nonfat," "low fat," or "fat free."  As a guide, look on the label and choose foods with less than 3 g of fat per serving. Eat only one serving.  Avoid alcohol.  Do not smoke. If you need help quitting, talk with your health care provider.  Eat small frequent meals instead of three large heavy meals. WHAT FOODS CAN I EAT? Grains Include healthy grains and starches such as potatoes, wheat bread, fiber-rich cereal, and brown rice. Choose whole grain options whenever possible. In adults, whole grains should account for 45-65% of your daily calories.  Fruits and Vegetables Eat plenty of fruits and vegetables. Fresh fruits and vegetables add fiber to your diet. Meats and Other Protein Sources Eat lean meat such as chicken and pork. Trim any fat off of meat before cooking it. Eggs, fish, and beans are other sources of protein. In adults, these foods should account for 10-35% of your daily calories. Dairy Choose low-fat milk and dairy options. Dairy includes fat and protein, as well as calcium.  Fats and Oils Limit high-fat foods such as fried foods, sweets, baked goods, sugary drinks.  Other Creamy sauces and condiments, such as mayonnaise, can add extra fat. Think about whether  or not you need to use them, or use smaller amounts or low fat options. WHAT FOODS ARE NOT RECOMMENDED?  High fat foods, such as:  [employer].  Ice cream.  [place] toast.  Sweet rolls.  Pizza.  Cheese bread.  Foods covered with batter, butter, creamy sauces, or cheese.  Fried foods.  Sugary drinks and desserts.  Foods that cause gas or bloating   This information is not intended to replace advice given to you by your health care provider. Make sure you discuss any questions you have with your health care provider.

## 2019-11-15 NOTE — H&P (View-Only) (Signed)
Patient ID: Mackenzie Bailey, female   DOB: 12-16-1965, 54 y.o.   MRN: [id number]  Chief Complaint: Gallbladder attack, 2 weeks ago.  Recurring upper abdominal discomfort.  History of Present Illness Mackenzie Bailey is a 54 y.o. female with a seemingly unprecipitated acute onset of pain in the epigastrium with radiation to the back and both upper quadrants.  Unknown food provocation, recurring discomfort seems to happen after eating.  Her initial attack resolved spontaneously after taking ibuprofen and something for gas.  She denies pain and fever.  She reports nausea when she had her pain, felt like she needed to vomit but has never vomited.  Past Medical History Past Medical History:  Diagnosis Date  . Allergy   . Anxiety   . Asthma    SEASONAL   . Complication of anesthesia   . Female cystocele   . GERD (gastroesophageal reflux disease)    OCC  . Headache    RARE MIGRAINES  . Heart murmur    ASYMPTOMATIC  . Hypertension   . Menorrhagia   . Mitral valve prolapse   . PONV (postoperative nausea and vomiting)    WITH FRACTURE SURGERY ONLY  . SUI (stress urinary incontinence, female)   . Vaginal pessary present       Past Surgical History:  Procedure Laterality Date  . BREAST BIOPSY Right 2012?   benign  . BREAST SURGERY     benign biobsy  . COLONOSCOPY WITH PROPOFOL N/A 02/24/2018   Procedure: COLONOSCOPY WITH PROPOFOL;  Surgeon: Lucilla Lame, MD;  Location: [place];  Service: Endoscopy;  Laterality: N/A;  . CYSTOCELE REPAIR N/A 06/14/2019   Procedure: ANTERIOR REPAIR (CYSTOCELE);  Surgeon: Gae Dry, MD;  Location: [facility];  Service: Gynecology;  Laterality: N/A;  . CYSTOSCOPY N/A 06/14/2019   Procedure: CYSTOSCOPY;  Surgeon: Gae Dry, MD;  Location: [facility];  Service: Gynecology;  Laterality: N/A;  . FRACTURE SURGERY     R lower leg  . IUD REMOVAL  06/14/2019   Procedure: INTRAUTERINE DEVICE (IUD) REMOVAL;  Surgeon: Gae Dry,  MD;  Location: [facility];  Service: Gynecology;;  . LAPAROSCOPIC VAGINAL HYSTERECTOMY WITH SALPINGECTOMY Bilateral 06/14/2019   Procedure: LAPAROSCOPIC ASSISTED VAGINAL HYSTERECTOMY WITH BILATERAL SALPINGECTOMY;  Surgeon: Gae Dry, MD;  Location: [facility];  Service: Gynecology;  Laterality: Bilateral;  . POLYPECTOMY     uterine, found on sonohystogram, contributed to menorrhagia   . POLYPECTOMY  02/24/2018   Procedure: POLYPECTOMY;  Surgeon: Lucilla Lame, MD;  Location: [place];  Service: Endoscopy;;  . PUBOVAGINAL SLING N/A 06/14/2019   Procedure: Gaynelle Arabian;  Surgeon: Gae Dry, MD;  Location: [facility];  Service: Gynecology;  Laterality: N/A;    Allergies  Allergen Reactions  . Codeine Nausea And Vomiting  . Penicillins Hives    Did it involve swelling of the face/tongue/throat, SOB, or low BP? No Did it involve sudden or severe rash/hives, skin peeling, or any reaction on the inside of your mouth or nose? No Did you need to seek medical attention at a hospital or doctor's office? No When did it last happen?Childhood allergy If all above answers are "NO", may proceed with cephalosporin use.     Current Outpatient Medications  Medication Sig Dispense Refill  . albuterol (PROVENTIL) (2.5 MG/3ML) 0.083% nebulizer solution Take 3 mLs (2.5 mg total) by nebulization every 6 (six) hours as needed for wheezing or shortness of breath. (Patient taking differently: Take 2.5 mg by nebulization every  6 (six) hours as needed for wheezing or shortness of breath. Has not used in years) 75 mL 12  . ALPRAZolam (XANAX) 1 MG tablet Take 1 tablet (1 mg total) by mouth daily as needed for anxiety. 30 tablet 2  . amLODipine (NORVASC) 5 MG tablet Take 1 tablet (5 mg total) by mouth every morning. 90 tablet 2  . aspirin-acetaminophen-caffeine (EXCEDRIN MIGRAINE) 250-250-65 MG tablet Take 2 tablets by mouth every 6 (six) hours as needed for headache.    Marland Kitchen buPROPion  (WELLBUTRIN) 75 MG tablet Take 75 mg by mouth 2 (two) times daily.    . diphenhydramine-acetaminophen (TYLENOL PM) 25-500 MG TABS tablet Take 1-2 tablets by mouth at bedtime as needed (sleep).    Marland Kitchen ibuprofen (ADVIL) 200 MG tablet Take 400 mg by mouth every 6 (six) hours as needed for headache or moderate pain.    . montelukast (SINGULAIR) 10 MG tablet Take 1 tablet (10 mg total) by mouth at bedtime. 90 tablet 3  . oxybutynin (DITROPAN-XL) 10 MG 24 hr tablet TAKE 1 TABLET BY MOUTH EVERY DAY 90 tablet 2  . Pseudoeph-Doxylamine-DM-APAP (NYQUIL PO) Take 1 Dose by mouth at bedtime as needed (sleep).    . traZODone (DESYREL) 50 MG tablet TAKE 1/2 TO 1 TABLET BY MOUTH AT BEDTIME AS NEEDED FOR SLEEP 90 tablet 0   No current facility-administered medications for this visit.    Family History Family History  Problem Relation Age of Onset  . Arthritis Father   . Asthma Father   . Skin cancer Father        melanoma  . Thyroid disease Sister   . Uterine cancer Sister   . Heart attack Maternal Grandfather   . Early death Maternal Grandfather        massive heart attack  . Arthritis Paternal Grandmother   . Cancer Paternal Grandmother        uterine  . Stroke Paternal Grandfather   . Hypertension Mother   . Colon cancer Neg Hx   . Breast cancer Neg Hx       Social History Social History   Tobacco Use  . Smoking status: Never Smoker  . Smokeless tobacco: Never Used  Substance Use Topics  . Alcohol use: Yes    Alcohol/week: 1.0 - 2.0 standard drinks    Types: 1 - 2 Glasses of wine per week    Comment: OCC WINE  . Drug use: No        Review of Systems  Constitutional: Negative for chills, fever and weight loss.  HENT: Negative.   Eyes: Negative.   Respiratory: Negative.   Cardiovascular: Negative.   Genitourinary: Negative.   Musculoskeletal: Negative.   Skin: Negative.   Neurological: Negative.   Endo/Heme/Allergies: Negative.       Physical Exam Blood pressure 134/82,  pulse 82, temperature (!) 97.5 F (36.4 C), resp. rate 12, height 5\' 4"  (1.626 m), weight 202 lb 6.4 oz (91.8 kg), last menstrual period 05/09/2019, SpO2 98 %. Last Weight  Most recent update: 11/15/2019  2:03 PM   Weight  91.8 kg (202 lb 6.4 oz)            CONSTITUTIONAL: Well developed, and nourished, appropriately responsive and aware without distress.   EYES: Sclera non-icteric.   EARS, NOSE, MOUTH AND THROAT: Mask worn.  Hearing is intact to voice.  NECK: Trachea is midline, and there is no jugular venous distension.  LYMPH NODES:  Lymph nodes in the neck are not  enlarged. RESPIRATORY:  Lungs are clear, and breath sounds are equal bilaterally. Normal respiratory effort without pathologic use of accessory muscles. CARDIOVASCULAR: Heart is regular in rate and rhythm. GI: The abdomen is soft, nontender, and nondistended. There were no palpable masses. I did not appreciate hepatosplenomegaly. There were normal bowel sounds. MUSCULOSKELETAL:  Symmetrical muscle tone appreciated in all four extremities.    SKIN: Skin turgor is normal. No pathologic skin lesions appreciated.  NEUROLOGIC:  Motor and sensation appear grossly normal.  Cranial nerves are grossly without defect. PSYCH:  Alert and oriented to person, place and time. Affect is appropriate for situation.  Data Reviewed I have personally reviewed what is currently available of the patient's imaging, recent labs and medical records.   Labs:  CBC Latest Ref Rng & Units 11/06/2019 06/15/2019 06/11/2019  WBC 3.4 - 10.8 x10E3/uL 6.8 - 7.2  Hemoglobin 11.1 - 15.9 g/dL 12.6 11.3(L) 13.0  Hematocrit 34.0 - 46.6 % 40.0 - 41.2  Platelets 150 - 450 x10E3/uL 288 - 288   CMP Latest Ref Rng & Units 11/06/2019 06/11/2019 01/01/2019  Glucose 65 - 99 mg/dL 88 98 92  BUN 6 - 24 mg/dL 9 12 11   Creatinine 0.57 - 1.00 mg/dL 0.69 0.64 0.57  Sodium 134 - 144 mmol/L 139 139 141  Potassium 3.5 - 5.2 mmol/L 3.9 3.5 4.2  Chloride 96 - 106 mmol/L 97 105 104   CO2 20 - 29 mmol/L 22 25 21   Calcium 8.7 - 10.2 mg/dL 9.5 9.1 9.0  Total Protein 6.0 - 8.5 g/dL 6.6 - 6.4  Total Bilirubin 0.0 - 1.2 mg/dL 0.4 - <0.2  Alkaline Phos 39 - 117 IU/L 106 - 80  AST 0 - 40 IU/L 25 - 11  ALT 0 - 32 IU/L 31 - 11   CLINICAL DATA:  Colicky right upper quadrant abdominal pain. Additional history provided: Upper abdominal pain radiates to back.  EXAM: ULTRASOUND ABDOMEN LIMITED RIGHT UPPER QUADRANT  COMPARISON:  No pertinent prior studies available for comparison.  FINDINGS: Gallbladder:  Cholecystolithiasis visualized gallstones measuring up to 16 mm. No gallbladder wall thickening. No sonographic Percell Miller sign is elicited by the scanning technologist.  Common bile duct:  Diameter: 4-5 mm, within normal limits  Liver:  No focal lesion identified. Increased hepatic parenchymal echogenicity. Interrogated portal vein is patent on color Doppler imaging with normal direction of blood flow towards the liver.  IMPRESSION: Cholecystolithiasis.  Increased hepatic parenchymal echogenicity. This is a nonspecific finding, which may be seen in the setting of hepatic steatosis or other chronic hepatic parenchymal disease.   Electronically Signed   By: Kellie Simmering DO   On: 11/13/2019 09:11    Assessment    Chronic calculus cholecystitis. Patient Active Problem List   Diagnosis Date Noted  . S/P laparoscopic assisted vaginal hysterectomy (LAVH) 06/14/2019  . Urinary incontinence in female 05/16/2019  . Menorrhagia with regular cycle 05/16/2019  . Adjustment disorder with mixed anxiety and depressed mood 04/16/2019  . Insomnia 01/01/2019  . Has daytime drowsiness 01/01/2019  . Benign neoplasm of ascending colon   . Benign neoplasm of transverse colon   . Family history of melanoma 01/02/2018  . Obesity 01/02/2018  . Heme positive stool 12/05/2017  . Special screening for malignant neoplasms, colon 12/05/2017  . Allergic rhinitis  09/25/2015  . Asthma 09/25/2015  . Cardiac murmur 09/25/2015  . Essential hypertension 09/25/2015  . Anxiety 09/25/2015    Plan    Robotic assisted laparoscopic cholecystectomy. The risks, benefits, complications,  treatment options, and expected outcomes were discussed with the patient. The possibilities of bleeding, recurrent infection, finding a normal gallbladder, perforation of viscus organs, damage to surrounding structures, bile leak, abscess formation, needing a drain placed, the need for additional procedures, reaction to medication, pulmonary aspiration,  failure to diagnose a condition, the possible need to convert to an open procedure, and creating a complication requiring transfusion or operation were discussed with the patient. The patient and/or family concurred with the proposed plan, giving informed consent.   Face-to-face time spent with the patient and accompanying care providers(if present) was 32 minutes, with more than 50% of the time spent counseling, educating, and coordinating care of the patient.      Ronny Bacon M.D., FACS 11/15/2019, 2:34 PM

## 2019-11-15 NOTE — Progress Notes (Signed)
Patient ID: Mackenzie Bailey, female   DOB: [birth year], 54 y.o.   MRN: [id number]  Chief Complaint: Gallbladder attack, 2 weeks ago.  Recurring upper abdominal discomfort.  History of Present Illness Mackenzie Bailey is a 54 y.o. female with a seemingly unprecipitated acute onset of pain in the epigastrium with radiation to the back and both upper quadrants.  Unknown food provocation, recurring discomfort seems to happen after eating.  Her initial attack resolved spontaneously after taking ibuprofen and something for gas.  She denies pain and fever.  She reports nausea when she had her pain, felt like she needed to vomit but has never vomited.  Past Medical History Past Medical History:  Diagnosis Date  . Allergy   . Anxiety   . Asthma    SEASONAL   . Complication of anesthesia   . Female cystocele   . GERD (gastroesophageal reflux disease)    OCC  . Headache    RARE MIGRAINES  . Heart murmur    ASYMPTOMATIC  . Hypertension   . Menorrhagia   . Mitral valve prolapse   . PONV (postoperative nausea and vomiting)    WITH FRACTURE SURGERY ONLY  . SUI (stress urinary incontinence, female)   . Vaginal pessary present       Past Surgical History:  Procedure Laterality Date  . BREAST BIOPSY Right 2012?   benign  . BREAST SURGERY     benign biobsy  . COLONOSCOPY WITH PROPOFOL N/A 02/24/2018   Procedure: COLONOSCOPY WITH PROPOFOL;  Surgeon: Lucilla Lame, MD;  Location: [place];  Service: Endoscopy;  Laterality: N/A;  . CYSTOCELE REPAIR N/A 06/14/2019   Procedure: ANTERIOR REPAIR (CYSTOCELE);  Surgeon: Gae Dry, MD;  Location: [facility];  Service: Gynecology;  Laterality: N/A;  . CYSTOSCOPY N/A 06/14/2019   Procedure: CYSTOSCOPY;  Surgeon: Gae Dry, MD;  Location: [facility];  Service: Gynecology;  Laterality: N/A;  . FRACTURE SURGERY     R lower leg  . IUD REMOVAL  06/14/2019   Procedure: INTRAUTERINE DEVICE (IUD) REMOVAL;  Surgeon: Gae Dry,  MD;  Location: [facility];  Service: Gynecology;;  . LAPAROSCOPIC VAGINAL HYSTERECTOMY WITH SALPINGECTOMY Bilateral 06/14/2019   Procedure: LAPAROSCOPIC ASSISTED VAGINAL HYSTERECTOMY WITH BILATERAL SALPINGECTOMY;  Surgeon: Gae Dry, MD;  Location: [facility];  Service: Gynecology;  Laterality: Bilateral;  . POLYPECTOMY     uterine, found on sonohystogram, contributed to menorrhagia   . POLYPECTOMY  02/24/2018   Procedure: POLYPECTOMY;  Surgeon: Lucilla Lame, MD;  Location: [place];  Service: Endoscopy;;  . PUBOVAGINAL SLING N/A 06/14/2019   Procedure: Gaynelle Arabian;  Surgeon: Gae Dry, MD;  Location: [facility];  Service: Gynecology;  Laterality: N/A;    Allergies  Allergen Reactions  . Codeine Nausea And Vomiting  . Penicillins Hives    Did it involve swelling of the face/tongue/throat, SOB, or low BP? No Did it involve sudden or severe rash/hives, skin peeling, or any reaction on the inside of your mouth or nose? No Did you need to seek medical attention at a hospital or doctor's office? No When did it last happen?Childhood allergy If all above answers are "NO", may proceed with cephalosporin use.     Current Outpatient Medications  Medication Sig Dispense Refill  . albuterol (PROVENTIL) (2.5 MG/3ML) 0.083% nebulizer solution Take 3 mLs (2.5 mg total) by nebulization every 6 (six) hours as needed for wheezing or shortness of breath. (Patient taking differently: Take 2.5 mg by nebulization every  6 (six) hours as needed for wheezing or shortness of breath. Has not used in years) 75 mL 12  . ALPRAZolam (XANAX) 1 MG tablet Take 1 tablet (1 mg total) by mouth daily as needed for anxiety. 30 tablet 2  . amLODipine (NORVASC) 5 MG tablet Take 1 tablet (5 mg total) by mouth every morning. 90 tablet 2  . aspirin-acetaminophen-caffeine (EXCEDRIN MIGRAINE) 250-250-65 MG tablet Take 2 tablets by mouth every 6 (six) hours as needed for headache.    Marland Kitchen buPROPion  (WELLBUTRIN) 75 MG tablet Take 75 mg by mouth 2 (two) times daily.    . diphenhydramine-acetaminophen (TYLENOL PM) 25-500 MG TABS tablet Take 1-2 tablets by mouth at bedtime as needed (sleep).    Marland Kitchen ibuprofen (ADVIL) 200 MG tablet Take 400 mg by mouth every 6 (six) hours as needed for headache or moderate pain.    . montelukast (SINGULAIR) 10 MG tablet Take 1 tablet (10 mg total) by mouth at bedtime. 90 tablet 3  . oxybutynin (DITROPAN-XL) 10 MG 24 hr tablet TAKE 1 TABLET BY MOUTH EVERY DAY 90 tablet 2  . Pseudoeph-Doxylamine-DM-APAP (NYQUIL PO) Take 1 Dose by mouth at bedtime as needed (sleep).    . traZODone (DESYREL) 50 MG tablet TAKE 1/2 TO 1 TABLET BY MOUTH AT BEDTIME AS NEEDED FOR SLEEP 90 tablet 0   No current facility-administered medications for this visit.    Family History Family History  Problem Relation Age of Onset  . Arthritis Father   . Asthma Father   . Skin cancer Father        melanoma  . Thyroid disease Sister   . Uterine cancer Sister   . Heart attack Maternal Grandfather   . Early death Maternal Grandfather        massive heart attack  . Arthritis Paternal Grandmother   . Cancer Paternal Grandmother        uterine  . Stroke Paternal Grandfather   . Hypertension Mother   . Colon cancer Neg Hx   . Breast cancer Neg Hx       Social History Social History   Tobacco Use  . Smoking status: Never Smoker  . Smokeless tobacco: Never Used  Substance Use Topics  . Alcohol use: Yes    Alcohol/week: 1.0 - 2.0 standard drinks    Types: 1 - 2 Glasses of wine per week    Comment: OCC WINE  . Drug use: No        Review of Systems  Constitutional: Negative for chills, fever and weight loss.  HENT: Negative.   Eyes: Negative.   Respiratory: Negative.   Cardiovascular: Negative.   Genitourinary: Negative.   Musculoskeletal: Negative.   Skin: Negative.   Neurological: Negative.   Endo/Heme/Allergies: Negative.       Physical Exam Blood pressure 134/82,  pulse 82, temperature (!) 97.5 F (36.4 C), resp. rate 12, height 5\' 4"  (1.626 m), weight 202 lb 6.4 oz (91.8 kg), last menstrual period 05/09/2019, SpO2 98 %. Last Weight  Most recent update: 11/15/2019  2:03 PM   Weight  91.8 kg (202 lb 6.4 oz)            CONSTITUTIONAL: Well developed, and nourished, appropriately responsive and aware without distress.   EYES: Sclera non-icteric.   EARS, NOSE, MOUTH AND THROAT: Mask worn.  Hearing is intact to voice.  NECK: Trachea is midline, and there is no jugular venous distension.  LYMPH NODES:  Lymph nodes in the neck are not  enlarged. RESPIRATORY:  Lungs are clear, and breath sounds are equal bilaterally. Normal respiratory effort without pathologic use of accessory muscles. CARDIOVASCULAR: Heart is regular in rate and rhythm. GI: The abdomen is soft, nontender, and nondistended. There were no palpable masses. I did not appreciate hepatosplenomegaly. There were normal bowel sounds. MUSCULOSKELETAL:  Symmetrical muscle tone appreciated in all four extremities.    SKIN: Skin turgor is normal. No pathologic skin lesions appreciated.  NEUROLOGIC:  Motor and sensation appear grossly normal.  Cranial nerves are grossly without defect. PSYCH:  Alert and oriented to person, place and time. Affect is appropriate for situation.  Data Reviewed I have personally reviewed what is currently available of the patient's imaging, recent labs and medical records.   Labs:  CBC Latest Ref Rng & Units 11/06/2019 06/15/2019 06/11/2019  WBC 3.4 - 10.8 x10E3/uL 6.8 - 7.2  Hemoglobin 11.1 - 15.9 g/dL 12.6 11.3(L) 13.0  Hematocrit 34.0 - 46.6 % 40.0 - 41.2  Platelets 150 - 450 x10E3/uL 288 - 288   CMP Latest Ref Rng & Units 11/06/2019 06/11/2019 01/01/2019  Glucose 65 - 99 mg/dL 88 98 92  BUN 6 - 24 mg/dL 9 12 11   Creatinine 0.57 - 1.00 mg/dL 0.69 0.64 0.57  Sodium 134 - 144 mmol/L 139 139 141  Potassium 3.5 - 5.2 mmol/L 3.9 3.5 4.2  Chloride 96 - 106 mmol/L 97 105 104   CO2 20 - 29 mmol/L 22 25 21   Calcium 8.7 - 10.2 mg/dL 9.5 9.1 9.0  Total Protein 6.0 - 8.5 g/dL 6.6 - 6.4  Total Bilirubin 0.0 - 1.2 mg/dL 0.4 - <0.2  Alkaline Phos 39 - 117 IU/L 106 - 80  AST 0 - 40 IU/L 25 - 11  ALT 0 - 32 IU/L 31 - 11   CLINICAL DATA:  Colicky right upper quadrant abdominal pain. Additional history provided: Upper abdominal pain radiates to back.  EXAM: ULTRASOUND ABDOMEN LIMITED RIGHT UPPER QUADRANT  COMPARISON:  No pertinent prior studies available for comparison.  FINDINGS: Gallbladder:  Cholecystolithiasis visualized gallstones measuring up to 16 mm. No gallbladder wall thickening. No sonographic Percell Miller sign is elicited by the scanning technologist.  Common bile duct:  Diameter: 4-5 mm, within normal limits  Liver:  No focal lesion identified. Increased hepatic parenchymal echogenicity. Interrogated portal vein is patent on color Doppler imaging with normal direction of blood flow towards the liver.  IMPRESSION: Cholecystolithiasis.  Increased hepatic parenchymal echogenicity. This is a nonspecific finding, which may be seen in the setting of hepatic steatosis or other chronic hepatic parenchymal disease.   Electronically Signed   By: Kellie Simmering DO   On: 11/13/2019 09:11    Assessment    Chronic calculus cholecystitis. Patient Active Problem List   Diagnosis Date Noted  . S/P laparoscopic assisted vaginal hysterectomy (LAVH) 06/14/2019  . Urinary incontinence in female 05/16/2019  . Menorrhagia with regular cycle 05/16/2019  . Adjustment disorder with mixed anxiety and depressed mood 04/16/2019  . Insomnia 01/01/2019  . Has daytime drowsiness 01/01/2019  . Benign neoplasm of ascending colon   . Benign neoplasm of transverse colon   . Family history of melanoma 01/02/2018  . Obesity 01/02/2018  . Heme positive stool 12/05/2017  . Special screening for malignant neoplasms, colon 12/05/2017  . Allergic rhinitis  09/25/2015  . Asthma 09/25/2015  . Cardiac murmur 09/25/2015  . Essential hypertension 09/25/2015  . Anxiety 09/25/2015    Plan    Robotic assisted laparoscopic cholecystectomy. The risks, benefits, complications,  treatment options, and expected outcomes were discussed with the patient. The possibilities of bleeding, recurrent infection, finding a normal gallbladder, perforation of viscus organs, damage to surrounding structures, bile leak, abscess formation, needing a drain placed, the need for additional procedures, reaction to medication, pulmonary aspiration,  failure to diagnose a condition, the possible need to convert to an open procedure, and creating a complication requiring transfusion or operation were discussed with the patient. The patient and/or family concurred with the proposed plan, giving informed consent.   Face-to-face time spent with the patient and accompanying care providers(if present) was 32 minutes, with more than 50% of the time spent counseling, educating, and coordinating care of the patient.      Ronny Bacon M.D., FACS 11/15/2019, 2:34 PM

## 2019-11-16 ENCOUNTER — Telehealth: Payer: Self-pay | Admitting: Surgery

## 2019-11-16 NOTE — Telephone Encounter (Signed)
Pt has been advised of pre admission date/time, Covid Testing date and Surgery date.  Surgery Date: 11/23/19 Preadmission Testing Date: 2 hrs prior to px Covid Testing Date: 11/21/19 - patient advised to go to the [facility] ([place])  Patient has been made aware to call [phone], between 1-3:00pm the day before surgery, to find out what time to arrive.

## 2019-11-19 ENCOUNTER — Telehealth: Payer: Self-pay | Admitting: Surgery

## 2019-11-19 ENCOUNTER — Telehealth: Payer: Self-pay | Admitting: *Deleted

## 2019-11-19 NOTE — Telephone Encounter (Signed)
Patient called and stated that she is having surgery on 11/23/19 Dr Christian Mate Cholecystectomy and she needs a work note faxed to [employer] stating how long she will be out of work and any restrictions.   Fax number [facility] [phone]

## 2019-11-19 NOTE — Telephone Encounter (Signed)
Spoke with the patient and will return to work on 12/12/19 with lifting restrictions for 6 weeks after surgery. She may adjust her return to work date after post op visit.  Return to work note faxed to [employer].

## 2019-11-19 NOTE — Telephone Encounter (Signed)
Called pt back. Pt had a question in regards to getting second dose of Covid vaccine on 11/26/19 with her surgery being on 11/23/19.   Spoke to Dr. Dahlia Byes in regards to this. Per Dr. Dahlia Byes it should be okay to get the vaccine.   Pt made aware.

## 2019-11-19 NOTE — Telephone Encounter (Signed)
Patients calling asking if one of the nurses would give her a call. Please call patient and advise.

## 2019-11-21 ENCOUNTER — Other Ambulatory Visit
Admission: RE | Admit: 2019-11-21 | Discharge: 2019-11-21 | Disposition: A | Discharge status: Home / Self Care | Payer: Commercial Managed Care - PPO | Source: Ambulatory Visit | Attending: Surgery | Admitting: Surgery

## 2019-11-21 ENCOUNTER — Other Ambulatory Visit: Payer: Self-pay

## 2019-11-21 DIAGNOSIS — Z20822 Contact with and (suspected) exposure to covid-19: Secondary | ICD-10-CM | POA: Diagnosis not present

## 2019-11-21 DIAGNOSIS — Z01812 Encounter for preprocedural laboratory examination: Secondary | ICD-10-CM | POA: Insufficient documentation

## 2019-11-21 LAB — SARS CORONAVIRUS 2 (TAT 6-24 HRS): SARS Coronavirus 2: NEGATIVE

## 2019-11-22 ENCOUNTER — Encounter: Payer: Self-pay | Admitting: Surgery

## 2019-11-22 MED ORDER — INDOCYANINE GREEN 25 MG IV SOLR
1.2500 mg | Freq: Once | INTRAVENOUS | Status: AC
  Administered 2019-11-23: 1.25 mg via INTRAVENOUS
  Filled 2019-11-22: qty 10

## 2019-11-22 MED ORDER — CIPROFLOXACIN IN D5W 400 MG/200ML IV SOLN
400.0000 mg | INTRAVENOUS | Status: AC
  Administered 2019-11-23: 10:00:00 400 mg via INTRAVENOUS

## 2019-11-23 ENCOUNTER — Encounter
Admission: RE | Disposition: A | Discharge status: Home / Self Care | Payer: Self-pay | Source: Home / Self Care | Attending: Surgery

## 2019-11-23 ENCOUNTER — Ambulatory Visit: Payer: Commercial Managed Care - PPO | Admitting: Anesthesiology

## 2019-11-23 ENCOUNTER — Encounter: Payer: Self-pay | Admitting: Surgery

## 2019-11-23 ENCOUNTER — Other Ambulatory Visit: Payer: Self-pay

## 2019-11-23 ENCOUNTER — Ambulatory Visit
Admission: RE | Admit: 2019-11-23 | Discharge: 2019-11-23 | Disposition: A | Discharge status: Home / Self Care | Payer: Commercial Managed Care - PPO | Attending: Surgery | Admitting: Surgery

## 2019-11-23 DIAGNOSIS — K219 Gastro-esophageal reflux disease without esophagitis: Secondary | ICD-10-CM | POA: Diagnosis not present

## 2019-11-23 DIAGNOSIS — Z79899 Other long term (current) drug therapy: Secondary | ICD-10-CM | POA: Diagnosis not present

## 2019-11-23 DIAGNOSIS — F419 Anxiety disorder, unspecified: Secondary | ICD-10-CM | POA: Insufficient documentation

## 2019-11-23 DIAGNOSIS — K8010 Calculus of gallbladder with chronic cholecystitis without obstruction: Secondary | ICD-10-CM | POA: Diagnosis present

## 2019-11-23 DIAGNOSIS — I10 Essential (primary) hypertension: Secondary | ICD-10-CM | POA: Insufficient documentation

## 2019-11-23 DIAGNOSIS — Z7951 Long term (current) use of inhaled steroids: Secondary | ICD-10-CM | POA: Diagnosis not present

## 2019-11-23 DIAGNOSIS — J45909 Unspecified asthma, uncomplicated: Secondary | ICD-10-CM | POA: Diagnosis not present

## 2019-11-23 DIAGNOSIS — Z6834 Body mass index (BMI) 34.0-34.9, adult: Secondary | ICD-10-CM | POA: Insufficient documentation

## 2019-11-23 DIAGNOSIS — G4700 Insomnia, unspecified: Secondary | ICD-10-CM | POA: Diagnosis not present

## 2019-11-23 DIAGNOSIS — E669 Obesity, unspecified: Secondary | ICD-10-CM | POA: Diagnosis not present

## 2019-11-23 DIAGNOSIS — N393 Stress incontinence (female) (male): Secondary | ICD-10-CM | POA: Insufficient documentation

## 2019-11-23 SURGERY — CHOLECYSTECTOMY, ROBOT-ASSISTED, LAPAROSCOPIC
Anesthesia: General | Site: Abdomen

## 2019-11-23 MED ORDER — PHENYLEPHRINE HCL (PRESSORS) 10 MG/ML IV SOLN
INTRAVENOUS | Status: AC
  Filled 2019-11-23: qty 1

## 2019-11-23 MED ORDER — PHENYLEPHRINE HCL (PRESSORS) 10 MG/ML IV SOLN
INTRAVENOUS | Status: DC | PRN
  Administered 2019-11-23 (×3): 100 ug via INTRAVENOUS

## 2019-11-23 MED ORDER — ONDANSETRON HCL 4 MG/2ML IJ SOLN
4.0000 mg | Freq: Once | INTRAMUSCULAR | Status: AC | PRN
  Administered 2019-11-23: 4 mg via INTRAVENOUS

## 2019-11-23 MED ORDER — IBUPROFEN 800 MG PO TABS: 800.0000 mg | ORAL_TABLET | Freq: Three times a day (TID) | ORAL | 0 refills | Status: DC | PRN

## 2019-11-23 MED ORDER — FAMOTIDINE 20 MG PO TABS
ORAL_TABLET | ORAL | Status: AC
  Administered 2019-11-23: 20 mg via ORAL
  Filled 2019-11-23: qty 1

## 2019-11-23 MED ORDER — PROPOFOL 10 MG/ML IV BOLUS
INTRAVENOUS | Status: DC | PRN
  Administered 2019-11-23: 170 mg via INTRAVENOUS

## 2019-11-23 MED ORDER — SUGAMMADEX SODIUM 500 MG/5ML IV SOLN
INTRAVENOUS | Status: DC | PRN
  Administered 2019-11-23: 300 mg via INTRAVENOUS

## 2019-11-23 MED ORDER — FENTANYL CITRATE (PF) 100 MCG/2ML IJ SOLN
INTRAMUSCULAR | Status: DC | PRN
  Administered 2019-11-23 (×3): 50 ug via INTRAVENOUS

## 2019-11-23 MED ORDER — MIDAZOLAM HCL 2 MG/2ML IJ SOLN
INTRAMUSCULAR | Status: AC
  Filled 2019-11-23: qty 2

## 2019-11-23 MED ORDER — ACETAMINOPHEN 500 MG PO TABS: 1000.0000 mg | ORAL_TABLET | ORAL | Status: AC

## 2019-11-23 MED ORDER — SUCCINYLCHOLINE CHLORIDE 20 MG/ML IJ SOLN
INTRAMUSCULAR | Status: DC | PRN
  Administered 2019-11-23: 100 mg via INTRAVENOUS

## 2019-11-23 MED ORDER — ROCURONIUM BROMIDE 50 MG/5ML IV SOLN
INTRAVENOUS | Status: AC
  Filled 2019-11-23: qty 2

## 2019-11-23 MED ORDER — FENTANYL CITRATE (PF) 100 MCG/2ML IJ SOLN: 25.0000 ug | INTRAMUSCULAR | Status: DC | PRN

## 2019-11-23 MED ORDER — ONDANSETRON HCL 4 MG/2ML IJ SOLN
INTRAMUSCULAR | Status: DC | PRN
  Administered 2019-11-23 (×2): 4 mg via INTRAVENOUS

## 2019-11-23 MED ORDER — CHLORHEXIDINE GLUCONATE CLOTH 2 % EX PADS
6.0000 | MEDICATED_PAD | Freq: Once | CUTANEOUS | Status: AC
  Administered 2019-11-23: 6 via TOPICAL

## 2019-11-23 MED ORDER — ONDANSETRON HCL 4 MG/2ML IJ SOLN
INTRAMUSCULAR | Status: AC
  Filled 2019-11-23: qty 2

## 2019-11-23 MED ORDER — BUPIVACAINE LIPOSOME 1.3 % IJ SUSP
INTRAMUSCULAR | Status: DC | PRN
  Administered 2019-11-23: 20 mL

## 2019-11-23 MED ORDER — OXYCODONE-ACETAMINOPHEN 5-325 MG PO TABS: 1.0000 | ORAL_TABLET | ORAL | 0 refills | Status: DC | PRN

## 2019-11-23 MED ORDER — IPRATROPIUM-ALBUTEROL 0.5-2.5 (3) MG/3ML IN SOLN: 3.0000 mL | Freq: Once | RESPIRATORY_TRACT | Status: AC

## 2019-11-23 MED ORDER — FENTANYL CITRATE (PF) 100 MCG/2ML IJ SOLN
INTRAMUSCULAR | Status: AC
  Filled 2019-11-23: qty 2

## 2019-11-23 MED ORDER — BUPIVACAINE HCL (PF) 0.25 % IJ SOLN
INTRAMUSCULAR | Status: AC
  Filled 2019-11-23: qty 30

## 2019-11-23 MED ORDER — GLYCOPYRROLATE 0.2 MG/ML IJ SOLN
INTRAMUSCULAR | Status: AC
  Filled 2019-11-23: qty 1

## 2019-11-23 MED ORDER — DEXAMETHASONE SODIUM PHOSPHATE 10 MG/ML IJ SOLN
INTRAMUSCULAR | Status: DC | PRN
  Administered 2019-11-23: 10 mg via INTRAVENOUS

## 2019-11-23 MED ORDER — GABAPENTIN 300 MG PO CAPS: 300.0000 mg | ORAL_CAPSULE | ORAL | Status: AC

## 2019-11-23 MED ORDER — DEXAMETHASONE SODIUM PHOSPHATE 10 MG/ML IJ SOLN
INTRAMUSCULAR | Status: AC
  Filled 2019-11-23: qty 1

## 2019-11-23 MED ORDER — LACTATED RINGERS IV SOLN: INTRAVENOUS | Status: DC

## 2019-11-23 MED ORDER — FAMOTIDINE 20 MG PO TABS: 20.0000 mg | ORAL_TABLET | Freq: Once | ORAL | Status: AC

## 2019-11-23 MED ORDER — IPRATROPIUM-ALBUTEROL 0.5-2.5 (3) MG/3ML IN SOLN
RESPIRATORY_TRACT | Status: AC
  Administered 2019-11-23: 3 mL via RESPIRATORY_TRACT
  Filled 2019-11-23: qty 3

## 2019-11-23 MED ORDER — MIDAZOLAM HCL 2 MG/2ML IJ SOLN
INTRAMUSCULAR | Status: DC | PRN
  Administered 2019-11-23 (×2): 1 mg via INTRAVENOUS

## 2019-11-23 MED ORDER — BUPIVACAINE-EPINEPHRINE (PF) 0.25% -1:200000 IJ SOLN
INTRAMUSCULAR | Status: DC | PRN
  Administered 2019-11-23: 30 mL via PERINEURAL

## 2019-11-23 MED ORDER — IPRATROPIUM-ALBUTEROL 0.5-2.5 (3) MG/3ML IN SOLN: 3.0000 mL | RESPIRATORY_TRACT | Status: DC

## 2019-11-23 MED ORDER — LIDOCAINE HCL (PF) 2 % IJ SOLN
INTRAMUSCULAR | Status: AC
  Filled 2019-11-23: qty 20

## 2019-11-23 MED ORDER — EPINEPHRINE PF 1 MG/ML IJ SOLN
INTRAMUSCULAR | Status: AC
  Filled 2019-11-23: qty 1

## 2019-11-23 MED ORDER — OXYCODONE HCL 5 MG PO TABS: 5.0000 mg | ORAL_TABLET | Freq: Once | ORAL | Status: AC

## 2019-11-23 MED ORDER — ACETAMINOPHEN 500 MG PO TABS
ORAL_TABLET | ORAL | Status: AC
  Administered 2019-11-23: 1000 mg via ORAL
  Filled 2019-11-23: qty 2

## 2019-11-23 MED ORDER — CELECOXIB 200 MG PO CAPS
ORAL_CAPSULE | ORAL | Status: AC
  Administered 2019-11-23: 200 mg via ORAL
  Filled 2019-11-23: qty 1

## 2019-11-23 MED ORDER — BUPIVACAINE LIPOSOME 1.3 % IJ SUSP: 20.0000 mL | Freq: Once | INTRAMUSCULAR | Status: DC

## 2019-11-23 MED ORDER — EPHEDRINE SULFATE 50 MG/ML IJ SOLN
INTRAMUSCULAR | Status: AC
  Filled 2019-11-23: qty 1

## 2019-11-23 MED ORDER — ESMOLOL HCL 100 MG/10ML IV SOLN
INTRAVENOUS | Status: AC
  Filled 2019-11-23: qty 10

## 2019-11-23 MED ORDER — GLYCOPYRROLATE 0.2 MG/ML IJ SOLN
INTRAMUSCULAR | Status: DC | PRN
  Administered 2019-11-23: .2 mg via INTRAVENOUS

## 2019-11-23 MED ORDER — GABAPENTIN 300 MG PO CAPS
ORAL_CAPSULE | ORAL | Status: AC
  Administered 2019-11-23: 300 mg via ORAL
  Filled 2019-11-23: qty 1

## 2019-11-23 MED ORDER — CIPROFLOXACIN IN D5W 400 MG/200ML IV SOLN
INTRAVENOUS | Status: AC
  Filled 2019-11-23: qty 200

## 2019-11-23 MED ORDER — BUPIVACAINE LIPOSOME 1.3 % IJ SUSP
INTRAMUSCULAR | Status: AC
  Filled 2019-11-23: qty 20

## 2019-11-23 MED ORDER — SUCCINYLCHOLINE CHLORIDE 20 MG/ML IJ SOLN
INTRAMUSCULAR | Status: AC
  Filled 2019-11-23: qty 1

## 2019-11-23 MED ORDER — LIDOCAINE HCL (CARDIAC) PF 100 MG/5ML IV SOSY
PREFILLED_SYRINGE | INTRAVENOUS | Status: DC | PRN
  Administered 2019-11-23: 100 mg via INTRAVENOUS

## 2019-11-23 MED ORDER — SUGAMMADEX SODIUM 500 MG/5ML IV SOLN
INTRAVENOUS | Status: AC
  Filled 2019-11-23: qty 5

## 2019-11-23 MED ORDER — ONDANSETRON HCL 4 MG/2ML IJ SOLN
INTRAMUSCULAR | Status: AC
  Filled 2019-11-23: qty 4

## 2019-11-23 MED ORDER — ROCURONIUM BROMIDE 100 MG/10ML IV SOLN
INTRAVENOUS | Status: DC | PRN
  Administered 2019-11-23: 10 mg via INTRAVENOUS
  Administered 2019-11-23: 40 mg via INTRAVENOUS
  Administered 2019-11-23: 20 mg via INTRAVENOUS

## 2019-11-23 MED ORDER — CELECOXIB 200 MG PO CAPS: 200.0000 mg | ORAL_CAPSULE | ORAL | Status: AC

## 2019-11-23 MED ORDER — OXYCODONE HCL 5 MG PO TABS
ORAL_TABLET | ORAL | Status: AC
  Administered 2019-11-23: 5 mg via ORAL
  Filled 2019-11-23: qty 1

## 2019-11-23 SURGICAL SUPPLY — 38 items
CANISTER SUCT 1200ML W/VALVE (MISCELLANEOUS) ×4 IMPLANT
CHLORAPREP W/TINT 26 (MISCELLANEOUS) ×4 IMPLANT
CLIP VESOLOCK MED LG 6/CT (CLIP) ×4 IMPLANT
COVER TIP SHEARS 8 DVNC (MISCELLANEOUS) ×2 IMPLANT
COVER TIP SHEARS 8MM DA VINCI (MISCELLANEOUS) ×2
COVER WAND RF STERILE (DRAPES) ×4 IMPLANT
DECANTER SPIKE VIAL GLASS SM (MISCELLANEOUS) ×4 IMPLANT
DEFOGGER SCOPE WARMER CLEARIFY (MISCELLANEOUS) ×4 IMPLANT
DERMABOND ADVANCED (GAUZE/BANDAGES/DRESSINGS) ×2
DERMABOND ADVANCED .7 DNX12 (GAUZE/BANDAGES/DRESSINGS) ×2 IMPLANT
DRAPE 3/4 80X56 (DRAPES) ×4 IMPLANT
DRAPE ARM DVNC X/XI (DISPOSABLE) ×8 IMPLANT
DRAPE COLUMN DVNC XI (DISPOSABLE) ×2 IMPLANT
DRAPE DA VINCI XI ARM (DISPOSABLE) ×8
DRAPE DA VINCI XI COLUMN (DISPOSABLE) ×2
GLOVE ORTHO TXT STRL SZ7.5 (GLOVE) ×8 IMPLANT
GOWN STRL REUS W/ TWL LRG LVL3 (GOWN DISPOSABLE) ×8 IMPLANT
GOWN STRL REUS W/TWL LRG LVL3 (GOWN DISPOSABLE) ×8
GRASPER SUT TROCAR 14GX15 (MISCELLANEOUS) ×4 IMPLANT
IRRIGATION STRYKERFLOW (MISCELLANEOUS) IMPLANT
IRRIGATOR STRYKERFLOW (MISCELLANEOUS)
IV NS IRRIG 3000ML ARTHROMATIC (IV SOLUTION) IMPLANT
KIT PINK PAD W/HEAD ARE REST (MISCELLANEOUS) ×4
KIT PINK PAD W/HEAD ARM REST (MISCELLANEOUS) ×2 IMPLANT
KIT TURNOVER KIT A (KITS) ×4 IMPLANT
LABEL OR SOLS (LABEL) ×4 IMPLANT
NEEDLE HYPO 22GX1.5 SAFETY (NEEDLE) ×4 IMPLANT
NEEDLE INSUFFLATION 14GA 120MM (NEEDLE) ×4 IMPLANT
NS IRRIG 500ML POUR BTL (IV SOLUTION) ×4 IMPLANT
PACK LAP CHOLECYSTECTOMY (MISCELLANEOUS) ×4 IMPLANT
POUCH SPECIMEN RETRIEVAL 10MM (ENDOMECHANICALS) ×4 IMPLANT
SEAL CANN UNIV 5-8 DVNC XI (MISCELLANEOUS) ×8 IMPLANT
SEAL XI 5MM-8MM UNIVERSAL (MISCELLANEOUS) ×8
SET TUBE SMOKE EVAC HIGH FLOW (TUBING) ×4 IMPLANT
SOLUTION ELECTROLUBE (MISCELLANEOUS) ×4 IMPLANT
SUT MNCRL AB 4-0 PS2 18 (SUTURE) ×8 IMPLANT
SUT VICRYL 0 AB UR-6 (SUTURE) ×4 IMPLANT
TROCAR Z-THREAD FIOS 11X100 BL (TROCAR) ×4 IMPLANT

## 2019-11-23 NOTE — Anesthesia Procedure Notes (Signed)
Procedure Name: Intubation Performed by: Kelton Pillar, CRNA Pre-anesthesia Checklist: Patient identified, Emergency Drugs available, Suction available and Patient being monitored Patient Re-evaluated:Patient Re-evaluated prior to induction Oxygen Delivery Method: Circle system utilized Preoxygenation: Pre-oxygenation with 100% oxygen Ventilation: Mask ventilation without difficulty Laryngoscope Size: McGraph and 3 Grade View: Grade I Tube type: Oral Tube size: 7.0 mm Number of attempts: 1 Airway Equipment and Method: Stylet Placement Confirmation: ETT inserted through vocal cords under direct vision,  positive ETCO2,  breath sounds checked- equal and bilateral and CO2 detector Secured at: 21 cm Tube secured with: Tape Dental Injury: Teeth and Oropharynx as per pre-operative assessment

## 2019-11-23 NOTE — Transfer of Care (Signed)
Immediate Anesthesia Transfer of Care Note  Patient: Mackenzie Bailey  Procedure(s) Performed: XI ROBOTIC ASSISTED LAPAROSCOPIC CHOLECYSTECTOMY (N/A Abdomen) INDOCYANINE GREEN FLUORESCENCE IMAGING (ICG)  Patient Location: PACU  Anesthesia Type:General  Level of Consciousness: drowsy and responds to stimulation  Airway & Oxygen Therapy: Patient Spontanous Breathing and Patient connected to face mask oxygen  Post-op Assessment: Report given to RN and Post -op Vital signs reviewed and stable  Post vital signs: Reviewed and stable  Last Vitals:  Vitals Value Taken Time  BP 117/71 11/23/19 1127  Temp 36.2 C 11/23/19 1127  Pulse 85 11/23/19 1131  Resp 21 11/23/19 1131  SpO2 94 % 11/23/19 1131  Vitals shown include unvalidated device data.  Last Pain:  Vitals:   11/23/19 0807  TempSrc: Tympanic  PainSc: 0-No pain         Complications: No apparent anesthesia complications

## 2019-11-23 NOTE — Op Note (Signed)
Robotic cholecystectomy  Pre-operative Diagnosis: Chronic calculus cholecystitis  Post-operative Diagnosis:  Same.  Procedure: Robotic assisted laparoscopic cholecystectomy.  Surgeon: Ronny Bacon, M.D., FACS  Anesthesia: General. with endotracheal tube   Findings: As expected, excellent visualization with ICG/firefly imaging.  Estimated Blood Loss: 15 mL         Drains: None         Specimens: Gallbladder           Complications: none   Procedure Details  The patient was seen again in the Holding Room.  1.25 mg dose of ICG was administered intravenously.  The benefits, complications, treatment options, risks and expected outcomes were discussed with the patient. The likelihood of improving the patient's symptoms with return to their baseline status is good.  The patient and/or family concurred with the proposed plan, giving informed consent, again alternatives reviewed.  The patient was taken to Operating Room, identified, and the procedure verified as robotic assisted laparoscopic cholecystectomy.   Prior to the induction of general anesthesia, antibiotic prophylaxis was administered. VTE prophylaxis was in place. General endotracheal anesthesia was then administered and tolerated well. The patient was positioned in the supine position.  After the induction, the abdomen was prepped with Chloraprep and draped in the sterile fashion.    A Time Out was held and the above information confirmed.  After local infiltration of quarter percent Marcaine with epinephrine, stab incision was made left upper quadrant.  Just below the costal margin approximately midclavicular line the Veres needle is passed with sensation of the layers to penetrate the abdominal wall and into the peritoneum.  Saline drop test is confirmed peritoneal placement.  Insufflation is initiated with carbon dioxide to pressures of 15 mmHg.  Periumbilical local infiltration with quarter percent Marcaine with  epinephrine is utilized.  Made a 12 mm incision on the right infra-umbilical site, I advanced an optical 11 mm port under direct visualization into the peritoneal cavity.  Once the peritoneum was penetrated, insufflation was transferred.  The trocar was then advanced into the abdominal cavity under direct visualization. Pneumoperitoneum was then continued with CO2 at 14 mmHg or less and tolerated well without any adverse changes in the patient's vital signs.  Two 8.5-mm ports were placed in the left lower quadrant, and one to the right lower quadrant, all under direct vision. All skin incisions  were infiltrated with a local anesthetic agent before making the incision and placing the trocars.   The patient was positioned  in reverse Trendelenburg, tilted the patient's left side down.  Da Vinci XI robot was then positioned on to the patient's left side, and docked.  The gallbladder was identified, the fundus grasped via the arm 4 Prograsp and retracted cephalad. Adhesions were lysed with scissor cautery.  The infundibulum was identified grasped and retracted laterally, exposing the peritoneum overlying the triangle of Calot. This was then opened and dissected using cautery hook. An extended critical view of the cystic duct and cystic artery was obtained, aided by the ICG via FireFly which enabled ready visualization of the ductal anatomy.    The cystic duct was clearly identified and dissected to isolation.   Artery well isolated, and it and the cystic duct were double clipped and divided.  The gallbladder was taken from the gallbladder fossa in a retrograde fashion with the electrocautery. The gallbladder was removed and placed in an Endocatch bag.  The robot was undocked and moved away from the operative field. The gallbladder and Endocatch sac were then  removed through the periumbilical port site.   Inspection of the right upper quadrant was performed. No bleeding, bile duct injury or leak, or bowel  injury was noted.  The periumbilical port site was closed with interrumpted 0 Vicryl sutures. Pneumoperitoneum was released.  4-0 subcuticular Monocryl was used to close the skin. Dermabond was  applied.  The patient was then extubated and brought to the recovery room in stable condition. Sponge, lap, and needle counts were correct at closure and at the conclusion of the case.               Ronny Bacon, M.D., [facility] 11/23/2019 11:24 AM

## 2019-11-23 NOTE — Discharge Instructions (Signed)

## 2019-11-23 NOTE — Anesthesia Preprocedure Evaluation (Signed)
Anesthesia Evaluation  Patient identified by MRN, date of birth, ID band Patient awake    Reviewed: Allergy & Precautions, NPO status , Patient's Chart, lab work & pertinent test results  History of Anesthesia Complications (+) PONV and history of anesthetic complications  Airway Mallampati: III       Dental   Pulmonary asthma , neg sleep apnea, neg COPD, Not current smoker,           Cardiovascular hypertension, Pt. on medications (-) Past MI and (-) CHF (-) dysrhythmias + Valvular Problems/Murmurs (murmur, no tx)      Neuro/Psych neg Seizures Anxiety    GI/Hepatic Neg liver ROS, GERD  Medicated and Controlled,  Endo/Other  neg diabetes  Renal/GU negative Renal ROS     Musculoskeletal   Abdominal   Peds  Hematology   Anesthesia Other Findings   Reproductive/Obstetrics                             Anesthesia Physical Anesthesia Plan  ASA: III  Anesthesia Plan: General   Post-op Pain Management:    Induction: Intravenous  PONV Risk Score and Plan: 4 or greater and Ondansetron, Treatment may vary due to age or medical condition, Scopolamine patch - Pre-op and Dexamethasone  Airway Management Planned: Oral ETT  Additional Equipment:   Intra-op Plan:   Post-operative Plan:   Informed Consent: I have reviewed the patients History and Physical, chart, labs and discussed the procedure including the risks, benefits and alternatives for the proposed anesthesia with the patient or authorized representative who has indicated his/her understanding and acceptance.       Plan Discussed with:   Anesthesia Plan Comments:         Anesthesia Quick Evaluation

## 2019-11-23 NOTE — Interval H&P Note (Signed)
History and Physical Interval Note:  11/23/2019 9:17 AM  Mackenzie Bailey  has presented today for surgery, with the diagnosis of chronic calculous cholecystitis.  The various methods of treatment have been discussed with the patient and family. After consideration of risks, benefits and other options for treatment, the patient has consented to  Procedure(s): XI ROBOTIC Charles (N/A) as a surgical intervention.  The patient's history has been reviewed, patient examined, no change in status, stable for surgery.  I have reviewed the patient's chart and labs.  Questions were answered to the patient's satisfaction.     Ronny Bacon

## 2019-11-26 LAB — SURGICAL PATHOLOGY

## 2019-11-29 ENCOUNTER — Encounter: Payer: Self-pay | Admitting: Surgery

## 2019-11-29 ENCOUNTER — Ambulatory Visit (INDEPENDENT_AMBULATORY_CARE_PROVIDER_SITE_OTHER): Payer: Self-pay | Admitting: Surgery

## 2019-11-29 ENCOUNTER — Encounter: Payer: Self-pay | Admitting: Emergency Medicine

## 2019-11-29 ENCOUNTER — Other Ambulatory Visit: Payer: Self-pay

## 2019-11-29 VITALS — BP 112/79 | HR 83 | Temp 97.9°F | Resp 12 | Ht 64.0 in | Wt 198.0 lb

## 2019-11-29 DIAGNOSIS — Z9049 Acquired absence of other specified parts of digestive tract: Secondary | ICD-10-CM

## 2019-11-29 NOTE — Patient Instructions (Addendum)
Take fiber gummies and continue to push the fluids. Follow up as needed.   Please call the office if you have any questions or concerns.   GENERAL POST-OPERATIVE PATIENT INSTRUCTIONS   WOUND CARE INSTRUCTIONS:  Keep a dry clean dressing on the wound if there is drainage. The initial bandage may be removed after 24 hours.  Once the wound has quit draining you may leave it open to air.  If clothing rubs against the wound or causes irritation and the wound is not draining you may cover it with a dry dressing during the daytime.  Try to keep the wound dry and avoid ointments on the wound unless directed to do so.  If the wound becomes bright red and painful or starts to drain infected material that is not clear, please contact your physician immediately.  If the wound is mildly pink and has a thick firm ridge underneath it, this is normal, and is referred to as a healing ridge.  This will resolve over the next 4-6 weeks.  BATHING: You may shower if you have been informed of this by your surgeon. However, Please do not submerge in a tub, hot tub, or pool until incisions are completely sealed or have been told by your surgeon that you may do so.  DIET:  You may eat any foods that you can tolerate.  It is a good idea to eat a high fiber diet and take in plenty of fluids to prevent constipation.  If you do become constipated you may want to take a mild laxative or take ducolax tablets on a daily basis until your bowel habits are regular.  Constipation can be very uncomfortable, along with straining, after recent surgery.  ACTIVITY:  You are encouraged to cough and deep breath or use your incentive spirometer if you were given one, every 15-30 minutes when awake.  This will help prevent respiratory complications and low grade fevers post-operatively if you had a general anesthetic.  You may want to hug a pillow when coughing and sneezing to add additional support to the surgical area, if you had abdominal or  chest surgery, which will decrease pain during these times.  You are encouraged to walk and engage in light activity for the next two weeks.  You should not lift more than 20 pounds, until 01/04/2020 as it could put you at increased risk for complications.  Twenty pounds is roughly equivalent to a plastic bag of groceries. At that time- Listen to your body when lifting, if you have pain when lifting, stop and then try again in a few days. Soreness after doing exercises or activities of daily living is normal as you get back in to your normal routine.  MEDICATIONS:  Try to take narcotic medications and anti-inflammatory medications, such as tylenol, ibuprofen, naprosyn, etc., with food.  This will minimize stomach upset from the medication.  Should you develop nausea and vomiting from the pain medication, or develop a rash, please discontinue the medication and contact your physician.  You should not drive, make important decisions, or operate machinery when taking narcotic pain medication.  SUNBLOCK Use sun block to incision area over the next year if this area will be exposed to sun. This helps decrease scarring and will allow you avoid a permanent darkened area over your incision.  QUESTIONS:  Please feel free to call our office if you have any questions, and we will be glad to assist you.

## 2019-11-29 NOTE — Progress Notes (Signed)
[facility] POST-OP OFFICE VISIT  11/29/2019  HPI: Mackenzie Bailey is a 54 y.o. female 8 days s/p robotic cholecystectomy.  She reports a good appetite, had had some loose stools following coffee consumption.  This followed a 2 to 3-day duration of constipation immediately postop.  She is curious whether she should take Imodium.  She has no history of nausea, fever, vomiting or chills.  Vital signs: BP 112/79   Pulse 83   Temp 97.9 F (36.6 C)   Resp 12   Ht 5\' 4"  (1.626 m)   Wt 198 lb (89.8 kg)   LMP 05/09/2019 (Exact Date)   SpO2 97%   BMI 33.99 kg/m    Physical Exam: Constitutional: She appears well, moves very well sitting upright and reclining. Abdomen: Soft and nontender. Skin: Incisions are clean, dry and intact.  Dermabond intact.  There is no significant ecchymosis present.  Assessment/Plan: This is a 54 y.o. female 8 days s/p robotic cholecystectomy.  Patient Active Problem List   Diagnosis Date Noted  . CCC (chronic calculous cholecystitis) 11/15/2019  . S/P laparoscopic assisted vaginal hysterectomy (LAVH) 06/14/2019  . Urinary incontinence in female 05/16/2019  . Menorrhagia with regular cycle 05/16/2019  . Adjustment disorder with mixed anxiety and depressed mood 04/16/2019  . Insomnia 01/01/2019  . Has daytime drowsiness 01/01/2019  . Benign neoplasm of ascending colon   . Benign neoplasm of transverse colon   . Family history of melanoma 01/02/2018  . Obesity 01/02/2018  . Heme positive stool 12/05/2017  . Special screening for malignant neoplasms, colon 12/05/2017  . Allergic rhinitis 09/25/2015  . Asthma 09/25/2015  . Cardiac murmur 09/25/2015  . Essential hypertension 09/25/2015  . Anxiety 09/25/2015    -Reviewed expectations, bowel regimen, the addition of fiber and fluids.  Expecting that her dietary management will adjust her bowel function.  She can follow-up as needed.   Ronny Bacon M.D., FACS 11/29/2019, 10:16  AM

## 2019-12-04 ENCOUNTER — Telehealth: Payer: Self-pay

## 2019-12-04 NOTE — Telephone Encounter (Signed)
Return to work letter faxed to [place] at [phone] per patient request.

## 2019-12-05 NOTE — Anesthesia Postprocedure Evaluation (Signed)
Anesthesia Post Note  Patient: Mackenzie Bailey  Procedure(s) Performed: XI ROBOTIC ASSISTED LAPAROSCOPIC CHOLECYSTECTOMY (N/A Abdomen) INDOCYANINE GREEN FLUORESCENCE IMAGING (ICG)  Patient location during evaluation: PACU Anesthesia Type: General Level of consciousness: awake and alert Pain management: pain level controlled Vital Signs Assessment: post-procedure vital signs reviewed and stable Respiratory status: spontaneous breathing and respiratory function stable Cardiovascular status: stable Anesthetic complications: no     Last Vitals:  Vitals:   11/23/19 1327 11/23/19 1336  BP: (!) 99/46 111/76  Pulse: 82 85  Resp: (!) 21 16  Temp:  (!) 36.2 C  SpO2: 94% 95%    Last Pain:  Vitals:   11/26/19 0829  TempSrc:   PainSc: 7                  Amauria Younts K

## 2019-12-20 ENCOUNTER — Other Ambulatory Visit: Payer: Self-pay | Admitting: Family Medicine

## 2019-12-20 NOTE — Telephone Encounter (Signed)
Requested Prescriptions  Pending Prescriptions Disp Refills  . traZODone (DESYREL) 50 MG tablet [Pharmacy Med Name: TRAZODONE 50 MG TABLET] 90 tablet 0    Sig: TAKE 1/2 TO 1 TABLET BY MOUTH AT BEDTIME AS NEEDED FOR SLEEP     Psychiatry: Antidepressants - Serotonin Modulator Passed - 12/20/2019  1:50 AM      Passed - Valid encounter within last 6 months    Recent Outpatient Visits          1 month ago Acute bilateral thoracic back pain   [facility] [place], Dionne Bucy, MD   5 months ago Encounter for annual physical exam   [facility], Dionne Bucy, MD   8 months ago Class 1 obesity with serious comorbidity and body mass index (BMI) of 33.0 to 33.9 in adult, unspecified obesity type   [facility], Dionne Bucy, MD   11 months ago Insomnia, unspecified type   [facility], Dionne Bucy, MD   1 year ago Respiratory crackles at right lung base   [facility], Dionne Bucy, MD      Future Appointments            In 1 month [place], Dionne Bucy, MD [facility], [place]

## 2020-01-23 ENCOUNTER — Ambulatory Visit: Payer: Self-pay | Admitting: Family Medicine

## 2020-01-28 ENCOUNTER — Other Ambulatory Visit: Payer: Self-pay

## 2020-01-28 ENCOUNTER — Ambulatory Visit: Payer: Commercial Managed Care - PPO | Admitting: Family Medicine

## 2020-01-28 VITALS — BP 125/80 | HR 86 | Temp 97.1°F | Ht 64.0 in | Wt 200.0 lb

## 2020-01-28 DIAGNOSIS — F419 Anxiety disorder, unspecified: Secondary | ICD-10-CM | POA: Diagnosis not present

## 2020-01-28 DIAGNOSIS — I10 Essential (primary) hypertension: Secondary | ICD-10-CM

## 2020-01-28 DIAGNOSIS — R4789 Other speech disturbances: Secondary | ICD-10-CM

## 2020-01-28 DIAGNOSIS — W19XXXA Unspecified fall, initial encounter: Secondary | ICD-10-CM | POA: Insufficient documentation

## 2020-01-28 MED ORDER — PROPRANOLOL HCL 10 MG PO TABS: 10.0000 mg | ORAL_TABLET | Freq: Three times a day (TID) | ORAL | 2 refills | Status: DC | PRN

## 2020-01-28 MED ORDER — SERTRALINE HCL 50 MG PO TABS: ORAL_TABLET | ORAL | 0 refills | Status: DC

## 2020-01-28 NOTE — Assessment & Plan Note (Signed)
Pt with 3 month history of falls that is new to me Pt has had 5 episodes of falls  Plan: Stop taking Xanex Will check Folate and B-12 levels Will consider referral to neurology if symptoms continue to persist Follow up in

## 2020-01-28 NOTE — Assessment & Plan Note (Addendum)
Pt with history of anxiety that is worsening Pt is taking xanex prn  Plan: Will stop xanex due to suspicion of falls and word finding difficulty Will start zoloft 50mg  daily Will start propranolol before onset of symptoms of anxiety

## 2020-01-28 NOTE — Progress Notes (Signed)
Established patient visit      Patient: Mackenzie Bailey   DOB: [birth year]   54 y.o. Female  MRN: [id number] Visit Date: 01/28/2020  Today's healthcare provider: Lavon Paganini, MD  Subjective:    Chief Complaint  Patient presents with  . Hypertension  . Anxiety   HPI  Hypertension, follow-up:  BP Readings from Last 3 Encounters:  01/28/20 125/80  11/29/19 112/79  11/23/19 111/76    She was last seen for hypertension 6 months ago.  BP at that visit was 115/77. Management changes since that visit include amlodipine 5mg . She reports excellent compliance with treatment. She is not having side effects.  She is exercising. She is adherent to low salt diet.   Outside blood pressures are are not checked. She is experiencing none.  Patient denies chest pain, chest pressure/discomfort, claudication, dyspnea, exertional chest pressure/discomfort, fatigue, irregular heart beat and lower extremity edema.   Cardiovascular risk factors include hypertension and obesity (BMI >= 30 kg/m2).  Use of agents associated with hypertension: none.     Weight trend: increasing steadily Wt Readings from Last 3 Encounters:  01/28/20 200 lb (90.7 kg)  11/29/19 198 lb (89.8 kg)  11/15/19 202 lb 6.4 oz (91.8 kg)   Pt is interested in the phenermine for weight loss  Current diet: in general, a "healthy" diet    ------------------------------------------------------------------------     Follow up for Anxiety  The patient was last seen for this 6 months ago. Changes made at last visit include pt taking xanax. Pt takes medication 2-3 times a week Pt was prescribed xanax 2 years ago Attributes stress and anxiety due to having a teenage son  She feels that condition is Worse. Pt has previously tried celexa 40mg  with no improvement Pt wants to try a new medication that she can take regularly  GAD 7 : Generalized Anxiety Score 01/28/2020 04/16/2019 03/27/2018 01/02/2018    Nervous, Anxious, on Edge 2 3 0 1  Control/stop worrying 3 3 0 1  Worry too much - different things 3 3 0 1  Trouble relaxing 2 2 1 1   Restless 0 1 0 0  Easily annoyed or irritable 2 2 0 1  Afraid - awful might happen 1 2 1 2   Total GAD 7 Score 13 16 2 7   Anxiety Difficulty Not difficult at all Somewhat difficult Not difficult at all Not difficult at all      ------------------------------------------------------------------------------------   Falls  Pt states falls started 3 months ago Pt states she has fell 5 times and many times were she nearly falls and catches herself Pt states she trips over her feet Pt states that she has difficulty balancing Denies: Recent Trauma, Dizziness, Numbness and tingling in feet Associated symptoms:   Pt states that she has difficulty with word finding.   Pt states she is becoming for forgetful  Pt states that she has headaches on the frontal area  Pt states she takes motrin with some relief for headaches    ------------------------------------------------------------------------------------     Social History   Tobacco Use  . Smoking status: Never Smoker  . Smokeless tobacco: Never Used  Substance Use Topics  . Alcohol use: Yes    Alcohol/week: 1.0 - 2.0 standard drinks    Types: 1 - 2 Glasses of wine per week    Comment: OCC WINE  . Drug use: No   Social History   Socioeconomic History  . Marital status: Married    Spouse name:  Phu  . Number of children: 1  . Years of education: 38  . Highest education level: Some college, no degree  Occupational History    Employer: [employer]  Tobacco Use  . Smoking status: Never Smoker  . Smokeless tobacco: Never Used  Substance and Sexual Activity  . Alcohol use: Yes    Alcohol/week: 1.0 - 2.0 standard drinks    Types: 1 - 2 Glasses of wine per week    Comment: OCC WINE  . Drug use: No  . Sexual activity: Yes    Partners: Male    Comment: Mirena  Other Topics  Concern  . Not on file  Social History Narrative  . Not on file   Social Determinants of Health   Financial Resource Strain:   . Difficulty of Paying Living Expenses:   Food Insecurity:   . Worried About Charity fundraiser in the Last Year:   . Arboriculturist in the Last Year:   Transportation Needs:   . Film/video editor (Medical):   Marland Kitchen Lack of Transportation (Non-Medical):   Physical Activity:   . Days of Exercise per Week:   . Minutes of Exercise per Session:   Stress:   . Feeling of Stress :   Social Connections:   . Frequency of Communication with Friends and Family:   . Frequency of Social Gatherings with Friends and Family:   . Attends Religious Services:   . Active Member of Clubs or Organizations:   . Attends Archivist Meetings:   Marland Kitchen Marital Status:   Intimate Partner Violence:   . Fear of Current or Ex-Partner:   . Emotionally Abused:   Marland Kitchen Physically Abused:   . Sexually Abused:        Medications: Outpatient Medications Prior to Visit  Medication Sig  . ALPRAZolam (XANAX) 1 MG tablet Take 1 tablet (1 mg total) by mouth daily as needed for anxiety.  Marland Kitchen amLODipine (NORVASC) 5 MG tablet Take 1 tablet (5 mg total) by mouth every morning.  Marland Kitchen buPROPion (WELLBUTRIN XL) 150 MG 24 hr tablet Take 150 mg by mouth daily.  . diphenhydramine-acetaminophen (TYLENOL PM) 25-500 MG TABS tablet Take 1-2 tablets by mouth at bedtime as needed (sleep).  Marland Kitchen esomeprazole (NEXIUM) 20 MG capsule Take 20 mg by mouth daily as needed (heart burn).  Marland Kitchen ibuprofen (ADVIL) 200 MG tablet Take 400 mg by mouth every 6 (six) hours as needed for headache or moderate pain.  . montelukast (SINGULAIR) 10 MG tablet Take 1 tablet (10 mg total) by mouth at bedtime.  . Pseudoeph-Doxylamine-DM-APAP (NYQUIL PO) Take 1 Dose by mouth at bedtime as needed (sleep).  . traZODone (DESYREL) 50 MG tablet TAKE 1/2 TO 1 TABLET BY MOUTH AT BEDTIME AS NEEDED FOR SLEEP  . albuterol (PROVENTIL) (2.5 MG/3ML)  0.083% nebulizer solution Take 3 mLs (2.5 mg total) by nebulization every 6 (six) hours as needed for wheezing or shortness of breath. (Patient not taking: Reported on 11/23/2019)  . oxybutynin (DITROPAN-XL) 10 MG 24 hr tablet TAKE 1 TABLET BY MOUTH EVERY DAY (Patient not taking: No sig reported)  . [DISCONTINUED] ibuprofen (ADVIL) 800 MG tablet Take 1 tablet (800 mg total) by mouth every 8 (eight) hours as needed.   No facility-administered medications prior to visit.    Review of Systems  Constitutional: Negative.   HENT: Negative.   Eyes: Negative.   Respiratory: Negative.   Cardiovascular: Negative.   Gastrointestinal: Negative.   Endocrine: Negative.  Genitourinary: Negative.   Musculoskeletal: Negative.   Allergic/Immunologic: Negative.   Neurological: Positive for speech difficulty.  Hematological: Negative.   Psychiatric/Behavioral: The patient is nervous/anxious.     Last metabolic panel Lab Results  Component Value Date   GLUCOSE 88 11/06/2019   NA 139 11/06/2019   K 3.9 11/06/2019   CL 97 11/06/2019   CO2 22 11/06/2019   BUN 9 11/06/2019   CREATININE 0.69 11/06/2019   GFRNONAA 100 11/06/2019   GFRAA 116 11/06/2019   CALCIUM 9.5 11/06/2019   PROT 6.6 11/06/2019   ALBUMIN 4.1 11/06/2019   LABGLOB 2.5 11/06/2019   AGRATIO 1.6 11/06/2019   BILITOT 0.4 11/06/2019   ALKPHOS 106 11/06/2019   AST 25 11/06/2019   ALT 31 11/06/2019   ANIONGAP 9 06/11/2019   Last lipids Lab Results  Component Value Date   CHOL 162 03/27/2018   HDL 53 03/27/2018   LDLCALC 98 03/27/2018   TRIG 53 03/27/2018   CHOLHDL 3.1 03/27/2018   Last hemoglobin A1c No results found for: HGBA1C      Objective:    BP 125/80   Pulse 86   Temp (!) 97.1 F (36.2 C) (Temporal)   Ht 5\' 4"  (1.626 m)   Wt 200 lb (90.7 kg)   LMP 05/09/2019 (Exact Date)   BMI 34.33 kg/m  BP Readings from Last 3 Encounters:  01/28/20 125/80  11/29/19 112/79  11/23/19 111/76   Wt Readings from Last 3  Encounters:  01/28/20 200 lb (90.7 kg)  11/29/19 198 lb (89.8 kg)  11/15/19 202 lb 6.4 oz (91.8 kg)      Physical Exam Constitutional:      Appearance: Normal appearance.  HENT:     Head: Normocephalic and atraumatic.  Cardiovascular:     Rate and Rhythm: Normal rate and regular rhythm.     Pulses: Normal pulses.     Heart sounds: Normal heart sounds.  Pulmonary:     Effort: Pulmonary effort is normal.     Breath sounds: Normal breath sounds.  Abdominal:     Palpations: Abdomen is soft.  Musculoskeletal:     Cervical back: Neck supple.  Skin:    General: Skin is warm and dry.  Neurological:     General: No focal deficit present.     Mental Status: She is alert and oriented to person, place, and time.     Cranial Nerves: No cranial nerve deficit.     Sensory: No sensory deficit.     Motor: No weakness.     Coordination: Coordination normal.     Deep Tendon Reflexes: Reflexes normal.     Comments: Negative Romberg test  Psychiatric:        Mood and Affect: Mood normal.        Behavior: Behavior normal.       No results found for any visits on 01/28/20.    Assessment & Plan:    Problem List Items Addressed This Visit      Cardiovascular and Mediastinum   Essential hypertension - Primary    Pt with history of HTN that is well controlled on amlodipine 5mg  BP in office today is 125/80  Plan: Continue taking medication as prescribed      Relevant Medications   propranolol (INDERAL) 10 MG tablet     Other   Anxiety    Pt with history of anxiety that is worsening Pt is taking xanex prn  Plan: Will stop xanex due to suspicion of falls  and word finding difficulty Will start zoloft 50mg  daily Will start propranolol before onset of symptoms of anxiety       Relevant Medications   buPROPion (WELLBUTRIN XL) 150 MG 24 hr tablet   sertraline (ZOLOFT) 50 MG tablet   propranolol (INDERAL) 10 MG tablet   Falls, initial encounter    Pt with 3 month history of  falls that is new to me Pt has had 5 episodes of falls  Plan: Stop taking Xanex Will check Folate and B-12 levels Will consider referral to neurology if symptoms continue to persist Follow up in       Relevant Orders   B12 and Folate Panel    Other Visit Diagnoses    Word finding difficulty       Relevant Orders   B12 and Folate Panel     Word finding difficulty Pt with 3 month history of word finding difficulty Symptoms likely due to xanex side effect  Plan: Stop taking Xanex Will follow up in 2 weeks     Chipper Herb, [place] [facility] [place] [phone] (phone) [phone] (fax)  [place]

## 2020-01-28 NOTE — Patient Instructions (Signed)

## 2020-01-28 NOTE — Assessment & Plan Note (Addendum)
Pt with history of HTN that is well controlled on amlodipine 5mg  BP in office today is 125/80  Plan: Continue taking medication as prescribed

## 2020-01-29 ENCOUNTER — Telehealth: Payer: Self-pay

## 2020-01-29 LAB — B12 AND FOLATE PANEL
Folate: 3.4 ng/mL (ref >3.0)
Vitamin B-12: 349 pg/mL (ref 232–1245)

## 2020-01-29 NOTE — Telephone Encounter (Signed)
Pt advised.   Thanks,   -Sakina Briones  

## 2020-01-29 NOTE — Telephone Encounter (Signed)
-----   Message from Virginia Crews, MD sent at 01/29/2020  8:04 AM EDT ----- Normal labs

## 2020-02-04 ENCOUNTER — Other Ambulatory Visit: Payer: Self-pay | Admitting: Family Medicine

## 2020-02-04 DIAGNOSIS — J302 Other seasonal allergic rhinitis: Secondary | ICD-10-CM

## 2020-02-04 MED ORDER — MONTELUKAST SODIUM 10 MG PO TABS: 10.0000 mg | ORAL_TABLET | Freq: Every day | ORAL | 3 refills | Status: DC

## 2020-02-04 NOTE — Telephone Encounter (Signed)
Medication Refill - Medication: Singulair 10 mg  Has the patient contacted their pharmacy? Yes.   (Agent: If no, request that the patient contact the pharmacy for the refill.) (Agent: If yes, when and what did the pharmacy advise?)  Preferred Pharmacy (with phone number or street name): [facility]  Agent: Please be advised that RX refills may take up to 3 business days. We ask that you follow-up with your pharmacy.

## 2020-02-15 NOTE — Progress Notes (Signed)
Virtual telephone visit    Virtual Visit via Telephone Note   This visit type was conducted due to national recommendations for restrictions regarding the COVID-19 Pandemic (e.g. social distancing) in an effort to limit this patient's exposure and mitigate transmission in our community. Due to her co-morbid illnesses, this patient is at least at moderate risk for complications without adequate follow up. This format is felt to be most appropriate for this patient at this time. The patient did not have access to video technology or had technical difficulties with video requiring transitioning to audio format only (telephone). Physical exam was limited to content and character of the telephone converstion.     Patient location: home Provider location: [place] involved in the visit: patient, provider   Visit Date: 02/18/2020  Today's healthcare provider: Lavon Paganini, MD   Chief Complaint  Patient presents with  . Anxiety   Subjective    HPI Follow up for anxiety  The patient was last seen for this 3 weeks ago. Changes made at last visit include discontinue Xanax, start Zoloft 50mg  daily, and Propranolol before onset of anxiety symptoms.  She reports excellent compliance with treatment. She feels that condition is Improved. She is not having side effects.   Imbalance is much better since stopping Benzo  GAD 7 : Generalized Anxiety Score 02/18/2020 01/28/2020 04/16/2019 03/27/2018  Nervous, Anxious, on Edge 1 2 3  0  Control/stop worrying 1 3 3  0  Worry too much - different things 1 3 3  0  Trouble relaxing 0 2 2 1   Restless 0 0 1 0  Easily annoyed or irritable 1 2 2  0  Afraid - awful might happen 0 1 2 1   Total GAD 7 Score 4 13 16 2   Anxiety Difficulty Not difficult at all Not difficult at all Somewhat difficult Not difficult at all     -----------------------------------------------------------------------------------------       Social History   Tobacco Use  . Smoking status: Never Smoker  . Smokeless tobacco: Never Used  Substance Use Topics  . Alcohol use: Yes    Alcohol/week: 1.0 - 2.0 standard drinks    Types: 1 - 2 Glasses of wine per week    Comment: OCC WINE  . Drug use: No      Medications: Outpatient Medications Prior to Visit  Medication Sig  . amLODipine (NORVASC) 5 MG tablet Take 1 tablet (5 mg total) by mouth every morning.  Marland Kitchen buPROPion (WELLBUTRIN XL) 150 MG 24 hr tablet Take 150 mg by mouth daily.  . diphenhydramine-acetaminophen (TYLENOL PM) 25-500 MG TABS tablet Take 1-2 tablets by mouth at bedtime as needed (sleep).  Marland Kitchen esomeprazole (NEXIUM) 20 MG capsule Take 20 mg by mouth daily as needed (heart burn).  Marland Kitchen ibuprofen (ADVIL) 200 MG tablet Take 400 mg by mouth every 6 (six) hours as needed for headache or moderate pain.  . montelukast (SINGULAIR) 10 MG tablet Take 1 tablet (10 mg total) by mouth at bedtime.  . propranolol (INDERAL) 10 MG tablet Take 1 tablet (10 mg total) by mouth 3 (three) times daily as needed (anxiety).  . Pseudoeph-Doxylamine-DM-APAP (NYQUIL PO) Take 1 Dose by mouth at bedtime as needed (sleep).  . sertraline (ZOLOFT) 50 MG tablet Take 1 tablet (50 mg total) by mouth daily for 7 days, THEN 2 tablets (100 mg total) daily for 21 days.  . traZODone (DESYREL) 50 MG tablet TAKE 1/2 TO 1 TABLET BY MOUTH AT BEDTIME AS NEEDED FOR SLEEP  .  albuterol (PROVENTIL) (2.5 MG/3ML) 0.083% nebulizer solution Take 3 mLs (2.5 mg total) by nebulization every 6 (six) hours as needed for wheezing or shortness of breath. (Patient not taking: Reported on 11/23/2019)  . [DISCONTINUED] ALPRAZolam (XANAX) 1 MG tablet Take 1 tablet (1 mg total) by mouth daily as needed for anxiety. (Patient not taking: Reported on 02/18/2020)  . [DISCONTINUED] oxybutynin (DITROPAN-XL) 10 MG 24 hr tablet TAKE 1 TABLET BY MOUTH EVERY DAY (Patient not taking: No sig reported)   No facility-administered medications prior to  visit.    Review of Systems  Constitutional: Negative.   Respiratory: Negative.   Cardiovascular: Negative.   Neurological: Negative.   Psychiatric/Behavioral: Negative.     Last CBC Lab Results  Component Value Date   WBC 6.8 11/06/2019   HGB 12.6 11/06/2019   HCT 40.0 11/06/2019   MCV 79 11/06/2019   MCH 24.9 (L) 11/06/2019   RDW 14.8 11/06/2019   PLT 288 0000000   Last metabolic panel Lab Results  Component Value Date   GLUCOSE 88 11/06/2019   NA 139 11/06/2019   K 3.9 11/06/2019   CL 97 11/06/2019   CO2 22 11/06/2019   BUN 9 11/06/2019   CREATININE 0.69 11/06/2019   GFRNONAA 100 11/06/2019   GFRAA 116 11/06/2019   CALCIUM 9.5 11/06/2019   PROT 6.6 11/06/2019   ALBUMIN 4.1 11/06/2019   LABGLOB 2.5 11/06/2019   AGRATIO 1.6 11/06/2019   BILITOT 0.4 11/06/2019   ALKPHOS 106 11/06/2019   AST 25 11/06/2019   ALT 31 11/06/2019   ANIONGAP 9 06/11/2019   Last lipids Lab Results  Component Value Date   CHOL 162 03/27/2018   HDL 53 03/27/2018   LDLCALC 98 03/27/2018   TRIG 53 03/27/2018   CHOLHDL 3.1 03/27/2018   Last hemoglobin A1c No results found for: HGBA1C Last thyroid functions Lab Results  Component Value Date   TSH 1.280 01/01/2019   Last vitamin D Lab Results  Component Value Date   VD25OH 13.3 (L) 01/01/2019   Last vitamin B12 and Folate Lab Results  Component Value Date   VITAMINB12 349 01/28/2020   FOLATE 3.4 01/28/2020      Objective    LMP 05/09/2019 (Exact Date)  BP Readings from Last 3 Encounters:  01/28/20 125/80  11/29/19 112/79  11/23/19 111/76    Speaking in full sentences in NAD    Assessment & Plan     Problem List Items Addressed This Visit      Other   Anxiety - Primary    Well-controlled Doing well on Zoloft 100 mg daily, which we will continue Falls and word finding difficulty have improved since stopping Xanax-we will avoid Can use propranolol as needed, but has not needed this yet Continue to  monitor is starting phentermine Recheck in 3 months      Obesity    Discussed importance of healthy weight management Discussed diet and exercise She has previously done well with weight loss on phentermine, which we will resume Discussed possible rebound weight gain after stopping phentermine We will plan to use phentermine for 3 months and then take a break from it Discussed possibility of worsened insomnia/anxiety/blood pressure when taking phentermine, so she will monitor these and let me know if they worsen Follow-up in 3 months      Relevant Medications   phentermine 37.5 MG capsule       Return in about 3 months (around 05/20/2020) for obesity f/u.    I discussed  the assessment and treatment plan with the patient. The patient was provided an opportunity to ask questions and all were answered. The patient agreed with the plan and demonstrated an understanding of the instructions.   The patient was advised to call back or seek an in-person evaluation if the symptoms worsen or if the condition fails to improve as anticipated.  I, Lavon Paganini, MD, have reviewed all documentation for this visit. The documentation on 02/18/20 for the exam, diagnosis, procedures, and orders are all accurate and complete.   Bacigalupo, Dionne Bucy, MD, MPH [facility]

## 2020-02-17 ENCOUNTER — Other Ambulatory Visit: Payer: Self-pay | Admitting: Family Medicine

## 2020-02-17 DIAGNOSIS — F419 Anxiety disorder, unspecified: Secondary | ICD-10-CM

## 2020-02-17 NOTE — Telephone Encounter (Signed)
Requested medication (s) are due for refill today: yes  Requested medication (s) are on the active medication list: no  Last refill:  01/28/20  Future visit scheduled: yes  Notes to clinic:  med expired 02/25/20   Requested Prescriptions  Pending Prescriptions Disp Refills   sertraline (ZOLOFT) 50 MG tablet [Pharmacy Med Name: SERTRALINE HCL 50 MG TABLET] 147 tablet 1    Sig: Take 1 tablet (50 mg total) by mouth daily for 7 days, THEN 2 tablets (100 mg total) daily for 21 days.      Psychiatry:  Antidepressants - SSRI Passed - 02/17/2020  2:31 PM      Passed - Valid encounter within last 6 months    Recent Outpatient Visits           2 weeks ago Essential hypertension   [facility] [place], Dionne Bucy, MD   3 months ago Acute bilateral thoracic back pain   [facility] [place], Dionne Bucy, MD   7 months ago Encounter for annual physical exam   [facility], Dionne Bucy, MD   10 months ago Class 1 obesity with serious comorbidity and body mass index (BMI) of 33.0 to 33.9 in adult, unspecified obesity type   [facility], Dionne Bucy, MD   1 year ago Insomnia, unspecified type   [facility], Dionne Bucy, MD

## 2020-02-18 ENCOUNTER — Ambulatory Visit (INDEPENDENT_AMBULATORY_CARE_PROVIDER_SITE_OTHER): Payer: Commercial Managed Care - PPO | Admitting: Family Medicine

## 2020-02-18 DIAGNOSIS — Z6833 Body mass index (BMI) 33.0-33.9, adult: Secondary | ICD-10-CM

## 2020-02-18 DIAGNOSIS — F419 Anxiety disorder, unspecified: Secondary | ICD-10-CM | POA: Diagnosis not present

## 2020-02-18 DIAGNOSIS — E669 Obesity, unspecified: Secondary | ICD-10-CM

## 2020-02-18 MED ORDER — PHENTERMINE HCL 37.5 MG PO CAPS: 37.5000 mg | ORAL_CAPSULE | ORAL | 2 refills | Status: DC

## 2020-02-18 NOTE — Assessment & Plan Note (Signed)
Discussed importance of healthy weight management Discussed diet and exercise She has previously done well with weight loss on phentermine, which we will resume Discussed possible rebound weight gain after stopping phentermine We will plan to use phentermine for 3 months and then take a break from it Discussed possibility of worsened insomnia/anxiety/blood pressure when taking phentermine, so she will monitor these and let me know if they worsen Follow-up in 3 months

## 2020-02-18 NOTE — Telephone Encounter (Signed)
Patient has appointment today with Dr B

## 2020-02-18 NOTE — Assessment & Plan Note (Signed)
Well-controlled Doing well on Zoloft 100 mg daily, which we will continue Falls and word finding difficulty have improved since stopping Xanax-we will avoid Can use propranolol as needed, but has not needed this yet Continue to monitor is starting phentermine Recheck in 3 months

## 2020-03-16 ENCOUNTER — Other Ambulatory Visit: Payer: Self-pay | Admitting: Family Medicine

## 2020-03-16 NOTE — Telephone Encounter (Signed)
Requested Prescriptions  Pending Prescriptions Disp Refills  . traZODone (DESYREL) 50 MG tablet [Pharmacy Med Name: TRAZODONE 50 MG TABLET] 90 tablet 1    Sig: TAKE 1/2 TO 1 TABLET BY MOUTH AT BEDTIME AS NEEDED FOR SLEEP     Psychiatry: Antidepressants - Serotonin Modulator Passed - 03/16/2020 12:49 AM      Passed - Valid encounter within last 6 months    Recent Outpatient Visits          3 weeks ago [place], Dionne Bucy, MD   1 month ago Essential hypertension   [facility] [place], Dionne Bucy, MD   4 months ago Acute bilateral thoracic back pain   [facility] [place], Dionne Bucy, MD   8 months ago Encounter for annual physical exam   [facility], Dionne Bucy, MD   11 months ago Class 1 obesity with serious comorbidity and body mass index (BMI) of 33.0 to 33.9 in adult, unspecified obesity type   [facility], Dionne Bucy, MD      Future Appointments            In 2 months Bacigalupo, Dionne Bucy, MD [facility], [place]

## 2020-03-31 ENCOUNTER — Other Ambulatory Visit: Payer: Self-pay | Admitting: Family Medicine

## 2020-03-31 DIAGNOSIS — I10 Essential (primary) hypertension: Secondary | ICD-10-CM

## 2020-04-01 ENCOUNTER — Other Ambulatory Visit: Payer: Self-pay | Admitting: Family Medicine

## 2020-04-01 NOTE — Telephone Encounter (Signed)
Requested medications are due for refill today?  Yes - Medication listed as a historical med.    Requested medications are on active medication list?  Yes as a historical med.   Last Refill:  Unknown.    Future visit scheduled?  Yes in one month.    Notes to Clinic:  Medication listed as a historical med.  Unable to refill.

## 2020-04-30 ENCOUNTER — Telehealth: Payer: Self-pay | Admitting: Family Medicine

## 2020-04-30 NOTE — Telephone Encounter (Signed)
Requested medication (s) are due for refill today: no  Requested medication (s) are on the active medication list: yes  Last refill: 6/3/20201  Future visit scheduled: yes  Notes to clinic:  this refill cannot be delegated  Patient states that the refill was denied on 04/15/2020 and would like filled today   Requested Prescriptions  Pending Prescriptions Disp Refills   phentermine 37.5 MG capsule 30 capsule 2    Sig: Take 1 capsule (37.5 mg total) by mouth every morning.      Not Delegated - Gastroenterology:  Antiobesity Agents Failed - 04/30/2020  8:51 AM      Failed - This refill cannot be delegated      Passed - Last BP in normal range    BP Readings from Last 1 Encounters:  01/28/20 125/80          Passed - Last Heart Rate in normal range    Pulse Readings from Last 1 Encounters:  01/28/20 86          Passed - Valid encounter within last 12 months    Recent Outpatient Visits           2 months ago [place], Dionne Bucy, MD   3 months ago Essential hypertension   [facility] [place], Dionne Bucy, MD   5 months ago Acute bilateral thoracic back pain   [facility] [place], Dionne Bucy, MD   9 months ago Encounter for annual physical exam   [facility], Dionne Bucy, MD   1 year ago Class 1 obesity with serious comorbidity and body mass index (BMI) of 33.0 to 33.9 in adult, unspecified obesity type   [facility], Dionne Bucy, MD       Future Appointments             In 2 weeks [place], Kelby Aline, [place], [place]

## 2020-04-30 NOTE — Telephone Encounter (Signed)
Pt called stating that she tried to get her phentermine refilled at the pharmacy and was advised that PCP called the pharmacy to stop her from receiving a refill on the medication on 04/15/20. Pt is requesting to have medication refilled. Please advise.  '        [id number] [address], [place]  [address] [place] [address]  Phone: [phone] Fax: [phone]  Hours: Not open 24 hours

## 2020-05-19 ENCOUNTER — Other Ambulatory Visit: Payer: Self-pay

## 2020-05-19 ENCOUNTER — Ambulatory Visit: Payer: Commercial Managed Care - PPO | Admitting: Family Medicine

## 2020-05-19 ENCOUNTER — Ambulatory Visit (INDEPENDENT_AMBULATORY_CARE_PROVIDER_SITE_OTHER): Payer: Commercial Managed Care - PPO | Admitting: Adult Health

## 2020-05-19 DIAGNOSIS — Z5329 Procedure and treatment not carried out because of patient's decision for other reasons: Secondary | ICD-10-CM

## 2020-05-19 DIAGNOSIS — Z91199 Patient's noncompliance with other medical treatment and regimen due to unspecified reason: Secondary | ICD-10-CM

## 2020-05-19 NOTE — Progress Notes (Signed)
No answer x 2 calls. Left generic message to return call to office to reschedule. Missed 8:40 appointment. Was telephone visit that was scheduled. No other telephone numbers available.

## 2020-05-28 ENCOUNTER — Telehealth (INDEPENDENT_AMBULATORY_CARE_PROVIDER_SITE_OTHER): Payer: Commercial Managed Care - PPO | Admitting: Physician Assistant

## 2020-05-28 ENCOUNTER — Emergency Department: Payer: Commercial Managed Care - PPO

## 2020-05-28 ENCOUNTER — Emergency Department
Admission: EM | Admit: 2020-05-28 | Discharge: 2020-05-28 | Disposition: A | Discharge status: Home / Self Care | Payer: Commercial Managed Care - PPO | Attending: Emergency Medicine | Admitting: Emergency Medicine

## 2020-05-28 ENCOUNTER — Encounter: Payer: Self-pay | Admitting: Emergency Medicine

## 2020-05-28 DIAGNOSIS — J029 Acute pharyngitis, unspecified: Secondary | ICD-10-CM | POA: Insufficient documentation

## 2020-05-28 DIAGNOSIS — Z5321 Procedure and treatment not carried out due to patient leaving prior to being seen by health care provider: Secondary | ICD-10-CM | POA: Diagnosis not present

## 2020-05-28 DIAGNOSIS — R05 Cough: Secondary | ICD-10-CM | POA: Insufficient documentation

## 2020-05-28 DIAGNOSIS — U071 COVID-19: Secondary | ICD-10-CM | POA: Diagnosis not present

## 2020-05-28 DIAGNOSIS — J45901 Unspecified asthma with (acute) exacerbation: Secondary | ICD-10-CM

## 2020-05-28 DIAGNOSIS — Z20822 Contact with and (suspected) exposure to covid-19: Secondary | ICD-10-CM

## 2020-05-28 DIAGNOSIS — R062 Wheezing: Secondary | ICD-10-CM | POA: Insufficient documentation

## 2020-05-28 DIAGNOSIS — R0602 Shortness of breath: Secondary | ICD-10-CM | POA: Diagnosis present

## 2020-05-28 LAB — BASIC METABOLIC PANEL
Anion gap: 13 (ref 5–15)
BUN: 9 mg/dL (ref 6–20)
CO2: 20 mmol/L — ABNORMAL LOW (ref 22–32)
Calcium: 8.8 mg/dL — ABNORMAL LOW (ref 8.9–10.3)
Chloride: 104 mmol/L (ref 98–111)
Creatinine, Ser: 0.63 mg/dL (ref 0.44–1.00)
GFR calc Af Amer: >60 mL/min (ref >60)
GFR calc non Af Amer: >60 mL/min (ref >60)
Glucose, Bld: 138 mg/dL — ABNORMAL HIGH (ref 70–99)
Potassium: 2.8 mmol/L — ABNORMAL LOW (ref 3.5–5.1)
Sodium: 137 mmol/L (ref 135–145)

## 2020-05-28 LAB — CBC
HCT: 39.3 % (ref 36.0–46.0)
Hemoglobin: 13.1 g/dL (ref 12.0–15.0)
MCH: 25.8 pg — ABNORMAL LOW (ref 26.0–34.0)
MCHC: 33.3 g/dL (ref 30.0–36.0)
MCV: 77.4 fL — ABNORMAL LOW (ref 80.0–100.0)
Platelets: 239 10*3/uL (ref 150–400)
RBC: 5.08 MIL/uL (ref 3.87–5.11)
RDW: 16.2 % — ABNORMAL HIGH (ref 11.5–15.5)
WBC: 6 10*3/uL (ref 4.0–10.5)
nRBC: 0 % (ref 0.0–0.2)

## 2020-05-28 LAB — SARS CORONAVIRUS 2 BY RT PCR (HOSPITAL ORDER, PERFORMED IN ~~LOC~~ HOSPITAL LAB): SARS Coronavirus 2: POSITIVE — AB

## 2020-05-28 LAB — TROPONIN I (HIGH SENSITIVITY): Troponin I (High Sensitivity): <2 ng/L (ref <18)

## 2020-05-28 MED ORDER — PREDNISONE 10 MG PO TABS: ORAL_TABLET | ORAL | 0 refills | Status: DC

## 2020-05-28 MED ORDER — IPRATROPIUM-ALBUTEROL 0.5-2.5 (3) MG/3ML IN SOLN: 3.0000 mL | Freq: Four times a day (QID) | RESPIRATORY_TRACT | 1 refills | Status: DC | PRN

## 2020-05-28 NOTE — Progress Notes (Signed)
MyChart Video Visit    Virtual Visit via Video Note   This visit type was conducted due to national recommendations for restrictions regarding the COVID-19 Pandemic (e.g. social distancing) in an effort to limit this patient's exposure and mitigate transmission in our community. This patient is at least at moderate risk for complications without adequate follow up. This format is felt to be most appropriate for this patient at this time. Physical exam was limited by quality of the video and audio technology used for the visit.   Patient location: Home Provider location: Office    I discussed the limitations of evaluation and management by telemedicine and the availability of in person appointments. The patient expressed understanding and agreed to proceed.  Patient: Mackenzie Bailey   DOB: 04-10-1966   54 y.o. Female  MRN: [id number] Visit Date: 05/28/2020  Today's healthcare provider: Trinna Post, PA-C   Chief Complaint  Patient presents with  . Headache  . Nasal Congestion  . Shortness of Breath   Subjective    HPI   Patient with a history of asthma presents with several days of congestion, sore throat, hoarse voice, and fatigue present x several days. She reports initially having a hoarse voice but then this developed into worsening fatigue, SOB, wheezing. She has been vaccinated against COVID. She was tested with rapid COVID test yesterday which was negative. Her symptoms started mainly Tuesday aside from fatigue on Sunday. She reports SOB constantly and with coughing spells. She is using a nebulizer albuterol treatment twice already which was not helpful. She denies fevers, vomiting, diarrhea.     Medications: Outpatient Medications Prior to Visit  Medication Sig  . sertraline (ZOLOFT) 100 MG tablet Take 1 tablet (100 mg total) by mouth daily.  Marland Kitchen albuterol (PROVENTIL) (2.5 MG/3ML) 0.083% nebulizer solution Take 3 mLs (2.5 mg total) by nebulization every 6  (six) hours as needed for wheezing or shortness of breath. (Patient not taking: Reported on 11/23/2019)  . amLODipine (NORVASC) 5 MG tablet TAKE 1 TABLET BY MOUTH EVERY DAY IN THE MORNING  . buPROPion (WELLBUTRIN XL) 150 MG 24 hr tablet TAKE 1 TABLET BY MOUTH EVERY DAY  . diphenhydramine-acetaminophen (TYLENOL PM) 25-500 MG TABS tablet Take 1-2 tablets by mouth at bedtime as needed (sleep).  Marland Kitchen esomeprazole (NEXIUM) 20 MG capsule Take 20 mg by mouth daily as needed (heart burn).  Marland Kitchen ibuprofen (ADVIL) 200 MG tablet Take 400 mg by mouth every 6 (six) hours as needed for headache or moderate pain.  . montelukast (SINGULAIR) 10 MG tablet Take 1 tablet (10 mg total) by mouth at bedtime.  . phentermine 37.5 MG capsule Take 1 capsule (37.5 mg total) by mouth every morning.  . propranolol (INDERAL) 10 MG tablet Take 1 tablet (10 mg total) by mouth 3 (three) times daily as needed (anxiety).  . Pseudoeph-Doxylamine-DM-APAP (NYQUIL PO) Take 1 Dose by mouth at bedtime as needed (sleep).  . traZODone (DESYREL) 50 MG tablet TAKE 1/2 TO 1 TABLET BY MOUTH AT BEDTIME AS NEEDED FOR SLEEP   No facility-administered medications prior to visit.    Review of Systems  All other systems reviewed and are negative.     Objective    LMP 05/09/2019 (Exact Date)    Physical Exam Constitutional:      Appearance: Normal appearance.  Pulmonary:     Effort: Pulmonary effort is normal. No respiratory distress.  Neurological:     Mental Status: She is alert.  Psychiatric:  Mood and Affect: Mood normal.        Behavior: Behavior normal.        Assessment & Plan    1. Suspected COVID-19 virus infection  - symptoms and exam c/w viral URI  - no evidence of strep pharyngitis, CAP, AOM, bacterial sinusitis, or other bacterial infection - concern for possible COVID19 infection - will send for outpatient testing - discussed need to quarantine 10 days from start of symptoms and until fever-free for at least 48  hours - discussed need to quarantine household members - discussed symptomatic management, natural course, and return precautions  - predniSONE (DELTASONE) 10 MG tablet; Take 6 pills on day 1, 5 pills on day 2 and so on until complete.  Dispense: 21 tablet; Refill: 0 - DG Chest 2 View; Future  2. Moderate asthma with exacerbation, unspecified whether persistent  Treat for asthma exacerbation as below. - predniSONE (DELTASONE) 10 MG tablet; Take 6 pills on day 1, 5 pills on day 2 and so on until complete.  Dispense: 21 tablet; Refill: 0 - DG Chest 2 View; Future - ipratropium-albuterol (DUONEB) 0.5-2.5 (3) MG/3ML SOLN; Take 3 mLs by nebulization every 6 (six) hours as needed.  Dispense: 360 mL; Refill: 1    No follow-ups on file.     I discussed the assessment and treatment plan with the patient. The patient was provided an opportunity to ask questions and all were answered. The patient agreed with the plan and demonstrated an understanding of the instructions.   The patient was advised to call back or seek an in-person evaluation if the symptoms worsen or if the condition fails to improve as anticipated.    Paulene Floor [facility] [phone] (phone) [phone] (fax)  [place]

## 2020-05-28 NOTE — ED Triage Notes (Signed)
Pt c/o SOB without releif of nebulizer and albuterol treatments at home today. 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 Bilateral wheezing, dry cough heard on assessment in triage.

## 2020-05-29 ENCOUNTER — Encounter: Payer: Commercial Managed Care - PPO | Admitting: Physician Assistant

## 2020-05-29 ENCOUNTER — Ambulatory Visit: Payer: Self-pay

## 2020-05-29 ENCOUNTER — Encounter: Payer: Self-pay | Admitting: Physician Assistant

## 2020-05-29 NOTE — Telephone Encounter (Signed)
Pt. Went to ED yesterday with shortness of breath. COVID 19 test is positive. States she left the ED early because of the "long wait." Having headache, sore throat,sinus congestion. Virtual visit made for today.  Answer Assessment - Initial Assessment Questions 1. COVID-19 DIAGNOSIS: "Who made your Coronavirus (COVID-19) diagnosis?" "Was it confirmed by a positive lab test?" If not diagnosed by a HCP, ask "Are there lots of cases (community spread) where you live?" (See public health department website, if unsure)     Yes 2. COVID-19 EXPOSURE: "Was there any known exposure to [facility] before the symptoms began?" CDC Definition of close contact: within 6 feet (2 meters) for a total of 15 minutes or more over a 24-hour period.      No 3. ONSET: "When did the COVID-19 symptoms start?"      Sunday 4. WORST SYMPTOM: "What is your worst symptom?" (e.g., cough, fever, shortness of breath, muscle aches)     Shortness of breath 5. COUGH: "Do you have a cough?" If Yes, ask: "How bad is the cough?"       No, congestion 6. FEVER: "Do you have a fever?" If Yes, ask: "What is your temperature, how was it measured, and when did it start?"     No 7. RESPIRATORY STATUS: "Describe your breathing?" (e.g., shortness of breath, wheezing, unable to speak)      Shortness of breath 8. BETTER-SAME-WORSE: "Are you getting better, staying the same or getting worse compared to yesterday?"  If getting worse, ask, "In what way?"     Same 9. HIGH RISK DISEASE: "Do you have any chronic medical problems?" (e.g., asthma, heart or lung disease, weak immune system, obesity, etc.)     Astma 10. PREGNANCY: "Is there any chance you are pregnant?" "When was your last menstrual period?"       No 11. OTHER SYMPTOMS: "Do you have any other symptoms?"  (e.g., chills, fatigue, headache, loss of smell or taste, muscle pain, sore throat; new loss of smell or taste especially support the diagnosis of COVID-19)       Sore throat, ear  pain  Protocols used: CORONAVIRUS (COVID-19) DIAGNOSED OR SUSPECTED-A-AH

## 2020-05-29 NOTE — Progress Notes (Signed)
Patient cancelled appt.

## 2020-05-30 ENCOUNTER — Telehealth (INDEPENDENT_AMBULATORY_CARE_PROVIDER_SITE_OTHER): Payer: Commercial Managed Care - PPO | Admitting: Physician Assistant

## 2020-05-30 ENCOUNTER — Telehealth: Payer: Self-pay | Admitting: Unknown Physician Specialty

## 2020-05-30 ENCOUNTER — Ambulatory Visit: Payer: Self-pay | Admitting: *Deleted

## 2020-05-30 DIAGNOSIS — M545 Low back pain, unspecified: Secondary | ICD-10-CM

## 2020-05-30 DIAGNOSIS — E876 Hypokalemia: Secondary | ICD-10-CM | POA: Diagnosis not present

## 2020-05-30 DIAGNOSIS — U071 COVID-19: Secondary | ICD-10-CM

## 2020-05-30 NOTE — Telephone Encounter (Signed)
Patient is COVID +. Patient was seen at ED but left due to wait. She did have labs and chest xray completed before she left. Patient has been treating symptoms with OTC medications.Patient woke up this morning with pain in lower back- patient has low K+. Feels like "crap". Appointment scheduled for follow up to COVID diagnosis for review of symptoms and possible Rx medications.  Reason for Disposition . [1] HIGH RISK patient (e.g., age > 43 years, diabetes, heart or lung disease, weak immune system) AND [2] new or worsening symptoms    Patient concerned about lower back pain and K+ level- follow up appointment made.  Answer Assessment - Initial Assessment Questions 1. COVID-19 DIAGNOSIS: "Who made your Coronavirus (COVID-19) diagnosis?" "Was it confirmed by a positive lab test?" If not diagnosed by a HCP, ask "Are there lots of cases (community spread) where you live?" (See public health department website, if unsure)     Diagnosis after Zoom call- ED test 2. COVID-19 EXPOSURE: "Was there any known exposure to COVID before the symptoms began?" CDC Definition of close contact: within 6 feet (2 meters) for a total of 15 minutes or more over a 24-hour period.      Patient is not sure where she got it 3. ONSET: "When did the COVID-19 symptoms start?"      Started Tuesday am 4. WORST SYMPTOM: "What is your worst symptom?" (e.g., cough, fever, shortness of breath, muscle aches)     Back pain 5. COUGH: "Do you have a cough?" If Yes, ask: "How bad is the cough?"       Cough is same 6. FEVER: "Do you have a fever?" If Yes, ask: "What is your temperature, how was it measured, and when did it start?"     no 7. RESPIRATORY STATUS: "Describe your breathing?" (e.g., shortness of breath, wheezing, unable to speak)      Neb treatment- better 8. BETTER-SAME-WORSE: "Are you getting better, staying the same or getting worse compared to yesterday?"  If getting worse, ask, "In what way?"     Getting worse- had  rough night- back pain 9. HIGH RISK DISEASE: "Do you have any chronic medical problems?" (e.g., asthma, heart or lung disease, weak immune system, obesity, etc.)     asthma 10. PREGNANCY: "Is there any chance you are pregnant?" "When was your last menstrual period?"       n/a 11. OTHER SYMPTOMS: "Do you have any other symptoms?"  (e.g., chills, fatigue, headache, loss of smell or taste, muscle pain, sore throat; new loss of smell or taste especially support the diagnosis of COVID-19)       Fatigue, weakness, congestion, headcahe  Protocols used: CORONAVIRUS (COVID-19) DIAGNOSED OR SUSPECTED-A-AH

## 2020-05-30 NOTE — Telephone Encounter (Signed)
Called to discuss with Mackenzie Bailey about Covid symptoms and the use of bamlanivimab, a monoclonal antibody infusion for those with mild to moderate Covid symptoms and at a high risk of hospitalization.     Pt is qualified for this infusion at the [facility] infusion center due to co-morbid conditions and/or a member of an at-risk group, however wants to think about infusion.  Number for the infusion center at [facility] given.

## 2020-06-03 NOTE — Progress Notes (Signed)
MyChart Video Visit    Virtual Visit via Video Note   This visit type was conducted due to national recommendations for restrictions regarding the COVID-19 Pandemic (e.g. social distancing) in an effort to limit this patient's exposure and mitigate transmission in our community. This patient is at least at moderate risk for complications without adequate follow up. This format is felt to be most appropriate for this patient at this time. Physical exam was limited by quality of the video and audio technology used for the visit.   Patient location: Home Provider location: Office   I discussed the limitations of evaluation and management by telemedicine and the availability of in person appointments. The patient expressed understanding and agreed to proceed.    Patient: Mackenzie Bailey   DOB: 05-20-1966   54 y.o. Female  MRN: [id number] Visit Date: 05/30/2020  Today's healthcare provider: Trinna Post, PA-C   Chief Complaint  Patient presents with  . Cough   Subjective    HPI   Patient presents today for f/u of cough. She was seen on 05/28/2020 with cough and treated for bronchitis and asthma exacerbation with prednisone. She is vaccinated against COVID. She tested 2 days after symptoms began and was negative for COVID. She was seen later on 05/28/2020 in the ER where she tested positive for COVID. She had other labs done including EKG, CXR, and CMET/CBC. She left before being seen due to long wait time. CXR was normal and negative for PNA. EKG was normal. Potassium was 2.8. She reports she went to the ER because her duoneb treatment was not working and she wanted a steroid shot. She reports some improvement with cough and SOB. She continues to take previous steroid. Reports she has low back pain and is concerned about kidneys. Denies abdominal pain, vomiting, dysuria, urinary frequency, hematuria. Denies constipation.   CMP Latest Ref Rng & Units 05/28/2020 11/06/2019  06/11/2019  Glucose 70 - 99 mg/dL 138(H) 88 98  BUN 6 - 20 mg/dL 9 9 12   Creatinine 0.44 - 1.00 mg/dL 0.63 0.69 0.64  Sodium 135 - 145 mmol/L 137 139 139  Potassium 3.5 - 5.1 mmol/L 2.8(L) 3.9 3.5  Chloride 98 - 111 mmol/L 104 97 105  CO2 22 - 32 mmol/L 20(L) 22 25  Calcium 8.9 - 10.3 mg/dL 8.8(L) 9.5 9.1  Total Protein 6.0 - 8.5 g/dL - 6.6 -  Total Bilirubin 0.0 - 1.2 mg/dL - 0.4 -  Alkaline Phos 39 - 117 IU/L - 106 -  AST 0 - 40 IU/L - 25 -  ALT 0 - 32 IU/L - 31 -   CBC Latest Ref Rng & Units 05/28/2020 11/06/2019 06/15/2019  WBC 4.0 - 10.5 K/uL 6.0 6.8 -  Hemoglobin 12.0 - 15.0 g/dL 13.1 12.6 11.3(L)  Hematocrit 36 - 46 % 39.3 40.0 -  Platelets 150 - 400 K/uL 239 288 -       Medications: Outpatient Medications Prior to Visit  Medication Sig  . albuterol (PROVENTIL) (2.5 MG/3ML) 0.083% nebulizer solution Take 3 mLs (2.5 mg total) by nebulization every 6 (six) hours as needed for wheezing or shortness of breath. (Patient not taking: Reported on 11/23/2019)  . amLODipine (NORVASC) 5 MG tablet TAKE 1 TABLET BY MOUTH EVERY DAY IN THE MORNING  . buPROPion (WELLBUTRIN XL) 150 MG 24 hr tablet TAKE 1 TABLET BY MOUTH EVERY DAY  . diphenhydramine-acetaminophen (TYLENOL PM) 25-500 MG TABS tablet Take 1-2 tablets by mouth at bedtime as needed (  sleep).  Marland Kitchen esomeprazole (NEXIUM) 20 MG capsule Take 20 mg by mouth daily as needed (heart burn).  Marland Kitchen ibuprofen (ADVIL) 200 MG tablet Take 400 mg by mouth every 6 (six) hours as needed for headache or moderate pain.  Marland Kitchen ipratropium-albuterol (DUONEB) 0.5-2.5 (3) MG/3ML SOLN Take 3 mLs by nebulization every 6 (six) hours as needed.  . montelukast (SINGULAIR) 10 MG tablet Take 1 tablet (10 mg total) by mouth at bedtime.  . phentermine 37.5 MG capsule Take 1 capsule (37.5 mg total) by mouth every morning.  . propranolol (INDERAL) 10 MG tablet Take 1 tablet (10 mg total) by mouth 3 (three) times daily as needed (anxiety).  . Pseudoeph-Doxylamine-DM-APAP (NYQUIL  PO) Take 1 Dose by mouth at bedtime as needed (sleep).  . sertraline (ZOLOFT) 100 MG tablet Take 1 tablet (100 mg total) by mouth daily.  . traZODone (DESYREL) 50 MG tablet TAKE 1/2 TO 1 TABLET BY MOUTH AT BEDTIME AS NEEDED FOR SLEEP   No facility-administered medications prior to visit.    Review of Systems    Objective    LMP 05/09/2019 (Exact Date)    Physical Exam Constitutional:      Appearance: Normal appearance.  Pulmonary:     Effort: Pulmonary effort is normal. No respiratory distress.  Neurological:     Mental Status: She is alert.  Psychiatric:        Mood and Affect: Mood normal.        Behavior: Behavior normal.        Assessment & Plan    1. COVID-19  Continue steroid. Placed referral to infusion center which patient ultimately declined. Reviewed CXR which was normal.  - Comprehensive Metabolic Panel (CMET)  2. Hypokalemia  Uncertain why her potassium is so low. Explained this could be lab abnormality. She is not currently vomiting. Possible related to COVID. EKG normal and patient not having muscle spasms or palpitations. Ordered CMET to recheck, advised on potassium rich foods.   3. Back Pain   Likely MSK, could be joint pain from COVID.   No follow-ups on file.     I discussed the assessment and treatment plan with the patient. The patient was provided an opportunity to ask questions and all were answered. The patient agreed with the plan and demonstrated an understanding of the instructions.   The patient was advised to call back or seek an in-person evaluation if the symptoms worsen or if the condition fails to improve as anticipated.  I spent 30 minutes dedicated to the care of this patient on the date of this encounter to include pre-visit review of records, face-to-face time with the patient discussing COVID infusion, return precautions, ddx of back pain and hypokalemia, and post visit ordering of testing.     Paulene Floor [facility] [phone] (phone) [phone] (fax)  [place]

## 2020-06-24 ENCOUNTER — Telehealth: Payer: Self-pay

## 2020-06-24 NOTE — Telephone Encounter (Signed)
Copied from [place] [id number]. Topic: General - Inquiry >> Jun 24, 2020 10:33 AM Greggory Keen D wrote: Reason for CRM:  pt called saying she is post covid for about two weeks.  She is still having some muscle and joint pain.  She wants to know if she should keep taking the tylenol and advil.  CB#  [phone]

## 2020-06-25 ENCOUNTER — Ambulatory Visit: Payer: Commercial Managed Care - PPO | Admitting: Physician Assistant

## 2020-06-25 NOTE — Telephone Encounter (Signed)
Left detailed message, to advise as below.

## 2020-06-25 NOTE — Telephone Encounter (Signed)
Yes. Ok to continue Tylenol/ibuprofen

## 2020-07-18 ENCOUNTER — Other Ambulatory Visit: Payer: Self-pay

## 2020-07-18 ENCOUNTER — Ambulatory Visit (INDEPENDENT_AMBULATORY_CARE_PROVIDER_SITE_OTHER): Payer: Commercial Managed Care - PPO | Admitting: Family Medicine

## 2020-07-18 VITALS — BP 125/88 | HR 77 | Temp 97.7°F | Wt 191.0 lb

## 2020-07-18 DIAGNOSIS — M2550 Pain in unspecified joint: Secondary | ICD-10-CM | POA: Diagnosis not present

## 2020-07-18 DIAGNOSIS — B360 Pityriasis versicolor: Secondary | ICD-10-CM | POA: Diagnosis not present

## 2020-07-18 DIAGNOSIS — M791 Myalgia, unspecified site: Secondary | ICD-10-CM | POA: Diagnosis not present

## 2020-07-18 MED ORDER — PREDNISONE 10 MG PO TABS: 10.0000 mg | ORAL_TABLET | Freq: Every day | ORAL | 0 refills | Status: DC

## 2020-07-18 MED ORDER — KETOCONAZOLE 2 % EX SHAM: 1.0000 "application " | MEDICATED_SHAMPOO | Freq: Once | CUTANEOUS | 0 refills | Status: DC

## 2020-07-18 NOTE — Progress Notes (Signed)
Acute Office Visit  Subjective:    Patient ID: Mackenzie Bailey, female    DOB: 04-Nov-1965, 54 y.o.   MRN: [id number]  Chief Complaint  Patient presents with  . Joint Pain    HPI Patient is in today for joint pain.  Patient was diagnosed with Covid on 05/28/20.  She states that since her recovery she continues to have headaches daily with them worse at night.  Her greatest complaint is muscle and joint pain. She reports all joints are involved.  She does not visually see any swelling of her joints but states she feels they are swollen internally.  She also complains of having a change in pigmentation of her arms.  They appear to have patchy spots  that she describes as looking like spots of dirt.    Past Medical History:  Diagnosis Date  . Allergy   . Anxiety   . Asthma    SEASONAL   . Complication of anesthesia   . Female cystocele   . GERD (gastroesophageal reflux disease)    OCC  . Headache    RARE MIGRAINES  . Heart murmur    ASYMPTOMATIC  . Hypertension   . Menorrhagia   . Mitral valve prolapse   . PONV (postoperative nausea and vomiting)    WITH FRACTURE SURGERY ONLY  . SUI (stress urinary incontinence, female)   . Vaginal pessary present     Past Surgical History:  Procedure Laterality Date  . ABDOMINAL HYSTERECTOMY    . BREAST BIOPSY Right 2012?   benign  . BREAST SURGERY     benign biobsy  . CHOLECYSTECTOMY    . COLONOSCOPY WITH PROPOFOL N/A 02/24/2018   Procedure: COLONOSCOPY WITH PROPOFOL;  Surgeon: Lucilla Lame, MD;  Location: [facility];  Service: Endoscopy;  Laterality: N/A;  . CYSTOCELE REPAIR N/A 06/14/2019   Procedure: ANTERIOR REPAIR (CYSTOCELE);  Surgeon: Gae Dry, MD;  Location: [facility];  Service: Gynecology;  Laterality: N/A;  . CYSTOSCOPY N/A 06/14/2019   Procedure: CYSTOSCOPY;  Surgeon: Gae Dry, MD;  Location: [facility];  Service: Gynecology;  Laterality: N/A;  . FRACTURE SURGERY     R lower leg  . IUD REMOVAL   06/14/2019   Procedure: INTRAUTERINE DEVICE (IUD) REMOVAL;  Surgeon: Gae Dry, MD;  Location: [facility];  Service: Gynecology;;  . LAPAROSCOPIC VAGINAL HYSTERECTOMY WITH SALPINGECTOMY Bilateral 06/14/2019   Procedure: LAPAROSCOPIC ASSISTED VAGINAL HYSTERECTOMY WITH BILATERAL SALPINGECTOMY;  Surgeon: Gae Dry, MD;  Location: [facility];  Service: Gynecology;  Laterality: Bilateral;  . POLYPECTOMY     uterine, found on sonohystogram, contributed to menorrhagia   . POLYPECTOMY  02/24/2018   Procedure: POLYPECTOMY;  Surgeon: Lucilla Lame, MD;  Location: [place];  Service: Endoscopy;;  . PUBOVAGINAL SLING N/A 06/14/2019   Procedure: Gaynelle Arabian;  Surgeon: Gae Dry, MD;  Location: [facility];  Service: Gynecology;  Laterality: N/A;    Family History  Problem Relation Age of Onset  . Arthritis Father   . Asthma Father   . Skin cancer Father        melanoma  . Thyroid disease Sister   . Uterine cancer Sister   . Heart attack Maternal Grandfather   . Early death Maternal Grandfather        massive heart attack  . Arthritis Paternal Grandmother   . Cancer Paternal Grandmother        uterine  . Stroke Paternal Grandfather   . Hypertension Mother   .  Colon cancer Neg Hx   . Breast cancer Neg Hx     Social History   Socioeconomic History  . Marital status: Married    Spouse name: Phu  . Number of children: 1  . Years of education: 76  . Highest education level: Some college, no degree  Occupational History    Employer: [employer]  Tobacco Use  . Smoking status: Never Smoker  . Smokeless tobacco: Never Used  Vaping Use  . Vaping Use: Never used  Substance and Sexual Activity  . Alcohol use: Yes    Alcohol/week: 1.0 - 2.0 standard drink    Types: 1 - 2 Glasses of wine per week    Comment: OCC WINE  . Drug use: No  . Sexual activity: Yes    Partners: Male    Comment: Mirena  Other Topics Concern  . Not on file  Social History  Narrative  . Not on file   Social Determinants of Health   Financial Resource Strain:   . Difficulty of Paying Living Expenses: Not on file  Food Insecurity:   . Worried About Charity fundraiser in the Last Year: Not on file  . Ran Out of Food in the Last Year: Not on file  Transportation Needs:   . Lack of Transportation (Medical): Not on file  . Lack of Transportation (Non-Medical): Not on file  Physical Activity:   . Days of Exercise per Week: Not on file  . Minutes of Exercise per Session: Not on file  Stress:   . Feeling of Stress : Not on file  Social Connections:   . Frequency of Communication with Friends and Family: Not on file  . Frequency of Social Gatherings with Friends and Family: Not on file  . Attends Religious Services: Not on file  . Active Member of Clubs or Organizations: Not on file  . Attends Archivist Meetings: Not on file  . Marital Status: Not on file  Intimate Partner Violence:   . Fear of Current or Ex-Partner: Not on file  . Emotionally Abused: Not on file  . Physically Abused: Not on file  . Sexually Abused: Not on file    Outpatient Medications Prior to Visit  Medication Sig Dispense Refill  . amLODipine (NORVASC) 5 MG tablet TAKE 1 TABLET BY MOUTH EVERY DAY IN THE MORNING 90 tablet 2  . buPROPion (WELLBUTRIN XL) 150 MG 24 hr tablet TAKE 1 TABLET BY MOUTH EVERY DAY 90 tablet 1  . diphenhydramine-acetaminophen (TYLENOL PM) 25-500 MG TABS tablet Take 1-2 tablets by mouth at bedtime as needed (sleep).    Marland Kitchen esomeprazole (NEXIUM) 20 MG capsule Take 20 mg by mouth daily as needed (heart burn).    Marland Kitchen ibuprofen (ADVIL) 200 MG tablet Take 400 mg by mouth every 6 (six) hours as needed for headache or moderate pain.    Marland Kitchen ipratropium-albuterol (DUONEB) 0.5-2.5 (3) MG/3ML SOLN Take 3 mLs by nebulization every 6 (six) hours as needed. 360 mL 1  . montelukast (SINGULAIR) 10 MG tablet Take 1 tablet (10 mg total) by mouth at bedtime. 90 tablet 3  .  phentermine 37.5 MG capsule Take 1 capsule (37.5 mg total) by mouth every morning. 30 capsule 2  . propranolol (INDERAL) 10 MG tablet Take 1 tablet (10 mg total) by mouth 3 (three) times daily as needed (anxiety). 30 tablet 2  . Pseudoeph-Doxylamine-DM-APAP (NYQUIL PO) Take 1 Dose by mouth at bedtime as needed (sleep).    Marland Kitchen  sertraline (ZOLOFT) 100 MG tablet Take 1 tablet (100 mg total) by mouth daily. 90 tablet 1  . traZODone (DESYREL) 50 MG tablet TAKE 1/2 TO 1 TABLET BY MOUTH AT BEDTIME AS NEEDED FOR SLEEP 90 tablet 1  . albuterol (PROVENTIL) (2.5 MG/3ML) 0.083% nebulizer solution Take 3 mLs (2.5 mg total) by nebulization every 6 (six) hours as needed for wheezing or shortness of breath. (Patient not taking: Reported on 11/23/2019) 75 mL 12   No facility-administered medications prior to visit.    Allergies  Allergen Reactions  . Codeine Nausea And Vomiting  . Penicillins Hives    Did it involve swelling of the face/tongue/throat, SOB, or low BP? No Did it involve sudden or severe rash/hives, skin peeling, or any reaction on the inside of your mouth or nose? No Did you need to seek medical attention at a hospital or doctor's office? No When did it last happen?Childhood allergy If all above answers are "NO", may proceed with cephalosporin use.     Review of Systems  Musculoskeletal: Positive for arthralgias and myalgias. Negative for joint swelling and neck stiffness.  Skin: Positive for color change.  Neurological: Positive for headaches.       Objective:    Physical Exam Constitutional:      Appearance: Normal appearance.  HENT:     Head: Normocephalic and atraumatic.  Eyes:     Conjunctiva/sclera: Conjunctivae normal.  Cardiovascular:     Rate and Rhythm: Normal rate and regular rhythm.     Pulses: Normal pulses.     Heart sounds: Normal heart sounds. No murmur heard.   Pulmonary:     Effort: Pulmonary effort is normal. No respiratory distress.     Breath sounds:  Normal breath sounds. No wheezing or rhonchi.  Abdominal:     General: There is no distension.     Palpations: Abdomen is soft.     Tenderness: There is no abdominal tenderness.  Musculoskeletal:        General: Tenderness present. No swelling or deformity.     Right lower leg: No edema.     Left lower leg: No edema.  Skin:    General: Skin is warm and dry.     Findings: Rash (splotchy hyperpigmentation of both arms) present.  Neurological:     Mental Status: She is alert and oriented to person, place, and time. Mental status is at baseline.     Sensory: No sensory deficit.     Motor: No weakness.     Gait: Gait normal.  Psychiatric:        Mood and Affect: Mood normal.        Behavior: Behavior normal.     BP 125/88 (BP Location: Right Arm, Patient Position: Sitting, Cuff Size: Normal)   Pulse 77   Temp 97.7 F (36.5 C) (Oral)   Wt 191 lb (86.6 kg)   LMP 05/09/2019 (Exact Date)   SpO2 100%   BMI 32.79 kg/m  Wt Readings from Last 3 Encounters:  07/18/20 191 lb (86.6 kg)  01/28/20 200 lb (90.7 kg)  11/29/19 198 lb (89.8 kg)    Health Maintenance Due  Topic Date Due  . Hepatitis C Screening  Never done  . COVID-19 Vaccine (1) Never done  . HIV Screening  Never done  . INFLUENZA VACCINE  05/18/2020    There are no preventive care reminders to display for this patient.   Lab Results  Component Value Date   TSH 1.280 01/01/2019  Lab Results  Component Value Date   WBC 6.0 05/28/2020   HGB 13.1 05/28/2020   HCT 39.3 05/28/2020   MCV 77.4 (L) 05/28/2020   PLT 239 05/28/2020   Lab Results  Component Value Date   NA 137 05/28/2020   K 2.8 (L) 05/28/2020   CO2 20 (L) 05/28/2020   GLUCOSE 138 (H) 05/28/2020   BUN 9 05/28/2020   CREATININE 0.63 05/28/2020   BILITOT 0.4 11/06/2019   ALKPHOS 106 11/06/2019   AST 25 11/06/2019   ALT 31 11/06/2019   PROT 6.6 11/06/2019   ALBUMIN 4.1 11/06/2019   CALCIUM 8.8 (L) 05/28/2020   ANIONGAP 13 05/28/2020   Lab  Results  Component Value Date   CHOL 162 03/27/2018   Lab Results  Component Value Date   HDL 53 03/27/2018   Lab Results  Component Value Date   LDLCALC 98 03/27/2018   Lab Results  Component Value Date   TRIG 53 03/27/2018   Lab Results  Component Value Date   CHOLHDL 3.1 03/27/2018   No results found for: HGBA1C     Assessment & Plan:   1. Myalgia 2. Polyarthralgia - new problem - worsening since having COVID 6 weeks ago - concerning for possible autoimmune disease - does have family history of SLE and Hashimoto's -No effusions or abnormalities noted on exam, but she does have significant tenderness over large muscle groups and joints -Large differential that needs further work-up -Check labs today -Start prednisone taper -Possibly referral to rheumatology pending labs - CBC with Differential/Platelet - Comprehensive metabolic panel - TSH - Sedimentation rate - C-reactive protein - CK - Rheumatoid factor - ANA w/Reflex - predniSONE (DELTASONE) 10 MG tablet; Take 1 tablet (10 mg total) by mouth daily with breakfast.  Dispense: 28 tablet; Refill: 0  3. Tinea versicolor - new problem - rash c/w tinea versicolor - start ketoconazole shampoo x5d - then use selsen blue shampoo to maintain weekly prn - ketoconazole (NIZORAL) 2 % shampoo; Apply 1 application topically once for 1 dose. Apply to affected area daily.  Let stay on skin for 5 minutes before washing off  Dispense: 100 mL; Refill: 0   Return if symptoms worsen or fail to improve.   I, Lavon Paganini, MD, have reviewed all documentation for this visit. The documentation on 07/18/20 for the exam, diagnosis, procedures, and orders are all accurate and complete.   [place], Dionne Bucy, MD, MPH [facility]

## 2020-07-18 NOTE — Assessment & Plan Note (Signed)
Recommend Selsun blue Shampoo after Ketoconazole lotion treatment.

## 2020-07-18 NOTE — Patient Instructions (Signed)
Tinea Versicolor  Tinea versicolor is a skin infection. It is caused by a type of yeast. It is normal for some yeast to be on your skin, but too much yeast causes this infection. The infection causes a rash of light or dark patches on your skin. The rash is most common on the chest, back, neck, or upper arms. The infection usually does not cause other problems. If it is treated, it will probably go away in a few weeks. The infection cannot be spread from one person to another (is not contagious). Follow these instructions at home:  Use over-the-counter and prescription medicines only as told by your doctor.  Scrub your skin every day with dandruff shampoo as told by your doctor.  Do not scratch your skin in the rash area.  Avoid places that are hot and humid.  Do not use tanning booths.  Try to avoid sweating a lot. Contact a doctor if:  Your symptoms get worse.  You have a fever.  You have redness, swelling, or pain in the rash area.  You have fluid or blood coming from your rash.  Your rash feels warm to the touch.  You have pus or a bad smell coming from your rash.  Your rash comes back (recurs) after treatment. Summary  Tinea versicolor is a skin infection. It causes a rash of light or dark patches on your skin.  The rash is most common on the chest, back, neck, or upper arms. This infection usually does not cause other problems.  Use over-the-counter and prescription medicines only as told by your doctor.  If the infection is treated, it will probably go away in a few weeks. This information is not intended to replace advice given to you by your health care provider. Make sure you discuss any questions you have with your health care provider. Document Revised: 09/16/2017 Document Reviewed: 06/06/2017 Elsevier Patient Education  2020 Elsevier Inc.  

## 2020-07-20 LAB — CBC WITH DIFFERENTIAL/PLATELET
Basophils Absolute: 0.1 10*3/uL (ref 0.0–0.2)
Basos: 1 %
EOS (ABSOLUTE): 0.1 10*3/uL (ref 0.0–0.4)
Eos: 1 %
Hematocrit: 39.4 % (ref 34.0–46.6)
Hemoglobin: 12.9 g/dL (ref 11.1–15.9)
Immature Grans (Abs): 0 10*3/uL (ref 0.0–0.1)
Immature Granulocytes: 0 %
Lymphocytes Absolute: 2.6 10*3/uL (ref 0.7–3.1)
Lymphs: 37 %
MCH: 26.7 pg (ref 26.6–33.0)
MCHC: 32.7 g/dL (ref 31.5–35.7)
MCV: 82 fL (ref 79–97)
Monocytes Absolute: 0.4 10*3/uL (ref 0.1–0.9)
Monocytes: 5 %
Neutrophils Absolute: 3.9 10*3/uL (ref 1.4–7.0)
Neutrophils: 56 %
Platelets: 313 10*3/uL (ref 150–450)
RBC: 4.83 x10E6/uL (ref 3.77–5.28)
RDW: 14.6 % (ref 11.7–15.4)
WBC: 7 10*3/uL (ref 3.4–10.8)

## 2020-07-20 LAB — COMPREHENSIVE METABOLIC PANEL
ALT: 16 IU/L (ref 0–32)
AST: 12 IU/L (ref 0–40)
Albumin/Globulin Ratio: 1.6 (ref 1.2–2.2)
Albumin: 4.2 g/dL (ref 3.8–4.9)
Alkaline Phosphatase: 102 IU/L (ref 44–121)
BUN/Creatinine Ratio: 15 (ref 9–23)
BUN: 9 mg/dL (ref 6–24)
Bilirubin Total: 0.2 mg/dL (ref 0.0–1.2)
CO2: 23 mmol/L (ref 20–29)
Calcium: 9.6 mg/dL (ref 8.7–10.2)
Chloride: 101 mmol/L (ref 96–106)
Creatinine, Ser: 0.62 mg/dL (ref 0.57–1.00)
GFR calc Af Amer: 120 mL/min/{1.73_m2} (ref >59)
GFR calc non Af Amer: 104 mL/min/{1.73_m2} (ref >59)
Globulin, Total: 2.7 g/dL (ref 1.5–4.5)
Glucose: 92 mg/dL (ref 65–99)
Potassium: 4.2 mmol/L (ref 3.5–5.2)
Sodium: 138 mmol/L (ref 134–144)
Total Protein: 6.9 g/dL (ref 6.0–8.5)

## 2020-07-20 LAB — RHEUMATOID FACTOR: Rheumatoid fact SerPl-aCnc: 10.5 IU/mL (ref 0.0–13.9)

## 2020-07-20 LAB — ANA W/REFLEX: Anti Nuclear Antibody (ANA): NEGATIVE

## 2020-07-20 LAB — C-REACTIVE PROTEIN: CRP: 18 mg/L — ABNORMAL HIGH (ref 0–10)

## 2020-07-20 LAB — SEDIMENTATION RATE: Sed Rate: 22 mm/hr (ref 0–40)

## 2020-07-20 LAB — CK: Total CK: 30 U/L — ABNORMAL LOW (ref 32–182)

## 2020-07-20 LAB — TSH: TSH: 1.59 u[IU]/mL (ref 0.450–4.500)

## 2020-07-21 ENCOUNTER — Telehealth: Payer: Self-pay

## 2020-07-21 NOTE — Telephone Encounter (Signed)
Pt. Reports she saw her lab results in My Chart and is concerned with her C-reactive protein. Asking if the results could be reviewed and she be advised.

## 2020-07-22 ENCOUNTER — Telehealth: Payer: Self-pay

## 2020-07-22 NOTE — Telephone Encounter (Signed)
-----   Message from Mar Daring, [place] sent at 07/21/2020  7:34 PM EDT ----- Blood count is normal. Kidney and liver function are normal. Sugar is normal. Sodium, potassium, and calcium are normal. Thyroid is normal. Sed rate is normal. C-reactive protein is elevated slightly. CK is low. This is most likely from muscle wasting due to inactivity from your recent covid infection. Rheumatoid factor is normal. ANA is negative. I know Dr. B had mentioned Rheumatology referral. As labs are inconclusive, especially regarding recent covid infection that could cause the elevated CRP and low CK, she may want to recheck labs. She will be back next week and you can address if referral or following labs is best.

## 2020-07-22 NOTE — Telephone Encounter (Signed)
Seen by patient Mackenzie Bailey on 07/21/2020 8:45 PM

## 2020-07-28 ENCOUNTER — Encounter: Payer: Commercial Managed Care - PPO | Admitting: Physician Assistant

## 2020-08-11 ENCOUNTER — Other Ambulatory Visit: Payer: Self-pay | Admitting: Family Medicine

## 2020-08-11 DIAGNOSIS — B360 Pityriasis versicolor: Secondary | ICD-10-CM

## 2020-08-11 NOTE — Telephone Encounter (Signed)
Requested medication (s) are due for refill today: yes  Requested medication (s) are on the active medication list: no  Last refill:  07/18/20  Future visit scheduled: yes  Notes to clinic:  not on med list    Requested Prescriptions  Pending Prescriptions Disp Refills   ketoconazole (NIZORAL) 2 % shampoo [Pharmacy Med Name: KETOCONAZOLE 2% SHAMPOO] 120 mL     Sig: APPLY TO AFFECTED AREA DAILY. LET STAY ON SKIN FOR 5 MINUTES BEFORE WASHING OFF      Over the Counter:  OTC Passed - 08/11/2020  1:19 AM      Passed - Valid encounter within last 12 months    Recent Outpatient Visits           3 weeks ago [place], Dionne Bucy, MD   2 months ago [place], [place], [place]   2 months ago Suspected COVID-19 virus infection   [place], Wendee Beavers, [place]   2 months ago No-show for appointment   [place], Kelby Aline, FNP   5 months ago [place] Bacigalupo, Dionne Bucy, MD       Future Appointments             In 4 weeks Bacigalupo, Dionne Bucy, MD [facility], [place]

## 2020-08-11 NOTE — Telephone Encounter (Signed)
Requested medication (s) are due for refill today: yes  Requested medication (s) are on the active medication list: no  Last refill:  07/18/20  Future visit scheduled: yes, 08/20/20  Notes to clinic:  not on active med list    Requested Prescriptions  Pending Prescriptions Disp Refills   ketoconazole (NIZORAL) 2 % shampoo [Pharmacy Med Name: KETOCONAZOLE 2% SHAMPOO] 120 mL     Sig: APPLY TO AFFECTED AREA DAILY. LET STAY ON SKIN FOR 5 MINUTES BEFORE WASHING OFF      Over the Counter:  OTC Passed - 08/11/2020  8:03 AM      Passed - Valid encounter within last 12 months    Recent Outpatient Visits           3 weeks ago [place], Dionne Bucy, MD   2 months ago [place], [place], [place]   2 months ago Suspected COVID-19 virus infection   [place], Wendee Beavers, [place]   2 months ago No-show for appointment   [place], Kelby Aline, FNP   5 months ago [place], Dionne Bucy, MD       Future Appointments             In 4 weeks Bacigalupo, Dionne Bucy, MD [facility], [place]

## 2020-08-17 ENCOUNTER — Other Ambulatory Visit: Payer: Self-pay | Admitting: Family Medicine

## 2020-08-17 DIAGNOSIS — F419 Anxiety disorder, unspecified: Secondary | ICD-10-CM

## 2020-08-25 ENCOUNTER — Encounter: Payer: Commercial Managed Care - PPO | Admitting: Adult Health

## 2020-08-28 ENCOUNTER — Encounter: Payer: Commercial Managed Care - PPO | Admitting: Adult Health

## 2020-09-09 ENCOUNTER — Encounter: Payer: Commercial Managed Care - PPO | Admitting: Family Medicine

## 2020-09-15 ENCOUNTER — Other Ambulatory Visit: Payer: Self-pay | Admitting: Family Medicine

## 2020-11-20 ENCOUNTER — Other Ambulatory Visit: Payer: Self-pay | Admitting: Family Medicine

## 2020-11-20 DIAGNOSIS — F419 Anxiety disorder, unspecified: Secondary | ICD-10-CM

## 2020-11-20 NOTE — Telephone Encounter (Signed)
Requested medications are due for refill today yes  Requested medications are on the active medication list yes  Last refill 10/31  Last visit 02/2020  Future visit scheduled no  Notes to clinic Failed protocol due to no valid visit within 6  months, no upcoming visit scheduled.

## 2020-12-08 ENCOUNTER — Other Ambulatory Visit: Payer: Self-pay | Admitting: Family Medicine

## 2020-12-19 NOTE — Progress Notes (Signed)
Established patient visit   Patient: Mackenzie Bailey   DOB: 02-23-1966   55 y.o. Female  MRN: [id number] Visit Date: 12/22/2020  Today's healthcare provider: Lavon Paganini, MD   Chief Complaint  Patient presents with  . Pain   Subjective    HPI  Follow up for Myalgia and Polyarthralgia  The patient was last seen for this 5 months ago. Changes made at last visit include Lab work was order (normal) Potential referral to Rheumatology. She feels that condition is Unchanged.  In muscles and joints Mostly LEs, but occasional hands No redness or swelling. No fevers No tick bites. No rash  Prednisone taper helped very temporarily  Pain on lateral L hip is new. Hurts to sleep on that side. -----------------------------------------------------------------------------------------    Social History   Tobacco Use  . Smoking status: Never Smoker  . Smokeless tobacco: Never Used  Vaping Use  . Vaping Use: Never used  Substance Use Topics  . Alcohol use: Yes    Alcohol/week: 1.0 - 2.0 standard drink    Types: 1 - 2 Glasses of wine per week    Comment: OCC WINE  . Drug use: No       Medications: Outpatient Medications Prior to Visit  Medication Sig  . amLODipine (NORVASC) 5 MG tablet TAKE 1 TABLET BY MOUTH EVERY DAY IN THE MORNING  . buPROPion (WELLBUTRIN XL) 150 MG 24 hr tablet TAKE 1 TABLET BY MOUTH EVERY DAY  . diphenhydramine-acetaminophen (TYLENOL PM) 25-500 MG TABS tablet Take 1-2 tablets by mouth at bedtime as needed (sleep).  Marland Kitchen esomeprazole (NEXIUM) 20 MG capsule Take 20 mg by mouth daily as needed (heart burn).  Marland Kitchen ipratropium-albuterol (DUONEB) 0.5-2.5 (3) MG/3ML SOLN Take 3 mLs by nebulization every 6 (six) hours as needed.  Marland Kitchen ketoconazole (NIZORAL) 2 % shampoo APPLY TO AFFECTED AREA DAILY. LET STAY ON SKIN FOR 5 MINUTES BEFORE WASHING OFF  . montelukast (SINGULAIR) 10 MG tablet Take 1 tablet (10 mg total) by mouth at bedtime.  . phentermine 37.5  MG capsule Take 1 capsule (37.5 mg total) by mouth every morning.  . propranolol (INDERAL) 10 MG tablet Take 1 tablet (10 mg total) by mouth 3 (three) times daily as needed (anxiety).  . Pseudoeph-Doxylamine-DM-APAP (NYQUIL PO) Take 1 Dose by mouth at bedtime as needed (sleep).  . sertraline (ZOLOFT) 100 MG tablet TAKE 1 TABLET BY MOUTH EVERY DAY  . traZODone (DESYREL) 50 MG tablet TAKE 1/2 TO 1 TABLET BY MOUTH AT BEDTIME AS NEEDED FOR SLEEP  . [DISCONTINUED] ibuprofen (ADVIL) 200 MG tablet Take 400 mg by mouth every 6 (six) hours as needed for headache or moderate pain.  . [DISCONTINUED] predniSONE (DELTASONE) 10 MG tablet Take 1 tablet (10 mg total) by mouth daily with breakfast.  . albuterol (PROVENTIL) (2.5 MG/3ML) 0.083% nebulizer solution Take 3 mLs (2.5 mg total) by nebulization every 6 (six) hours as needed for wheezing or shortness of breath.   No facility-administered medications prior to visit.    Review of Systems  Constitutional: Negative.   Respiratory: Negative.   Cardiovascular: Negative.   Gastrointestinal: Negative.   Musculoskeletal: Positive for arthralgias, back pain and myalgias. Negative for gait problem, joint swelling, neck pain and neck stiffness.  Neurological: Negative for weakness.        Objective    BP (!) 153/89 (BP Location: Left Arm, Patient Position: Sitting, Cuff Size: Large)   Pulse 79   Temp 97.8 F (36.6 C) (Oral)  Wt 202 lb (91.6 kg)   LMP 05/09/2019 (Exact Date)   SpO2 100%   BMI 34.67 kg/m    Physical Exam Vitals reviewed.  Constitutional:      General: She is not in acute distress.    Appearance: Normal appearance. She is well-developed. She is not diaphoretic.  HENT:     Head: Normocephalic and atraumatic.  Eyes:     General: No scleral icterus.    Conjunctiva/sclera: Conjunctivae normal.  Neck:     Thyroid: No thyromegaly.  Cardiovascular:     Rate and Rhythm: Normal rate and regular rhythm.     Pulses: Normal pulses.      Heart sounds: Normal heart sounds. No murmur heard.   Pulmonary:     Effort: Pulmonary effort is normal. No respiratory distress.     Breath sounds: Normal breath sounds. No wheezing, rhonchi or rales.  Musculoskeletal:     Cervical back: Neck supple.     Right lower leg: No edema.     Left lower leg: No edema.     Comments: No joint swelling or bony tenderness. No erythema. +Muscle tenderness. +TTP over L hip greater trochanter  Lymphadenopathy:     Cervical: No cervical adenopathy.  Skin:    General: Skin is warm and dry.     Findings: No rash.  Neurological:     Mental Status: She is alert and oriented to person, place, and time. Mental status is at baseline.  Psychiatric:        Mood and Affect: Mood normal.        Behavior: Behavior normal.      No results found for any visits on 12/22/20.  Assessment & Plan     1. Polyarthralgia 2. Elevated C-reactive protein (CRP) 3. Myalgia - ongoing for ~8 months - initially thought to be post-viral reaction, but has persisted - only minimal improvement from prednisone taper - Labs in 10/21 showed neg ANA and RF. Elevated CRP, normal ESR - will send to Rheumatology for further eval and management - Mobic and flexeril prn - return precautions discussed - Ambulatory referral to Rheumatology  4. Trochanteric bursitis of left hip - new problem - likely from gait change related to pain as above - discussed treatment options and decided to go ahead with corticosteroid injection  INJECTION: Patient was given informed consent,. Appropriate time out was taken. Area prepped and draped in usual sterile fashion. 1 cc of depo-medrol 40 mg/ml plus  4 cc of 1% lidocaine without epinephrine was injected into the L hip greater trochanteric bursa using a(n) lateral approach. The patient tolerated the procedure well. There were no complications. Post procedure instructions were given.   Meds ordered this encounter  Medications  . meloxicam  (MOBIC) 15 MG tablet    Sig: Take 1 tablet (15 mg total) by mouth daily.    Dispense:  30 tablet    Refill:  2  . cyclobenzaprine (FLEXERIL) 10 MG tablet    Sig: Take 1 tablet (10 mg total) by mouth 3 (three) times daily as needed for muscle spasms.    Dispense:  30 tablet    Refill:  0  . ALPRAZolam (XANAX) 1 MG tablet    Sig: Take 1 tablet (1 mg total) by mouth daily as needed for anxiety.    Dispense:  30 tablet    Refill:  0     Return if symptoms worsen or fail to improve.      I, Lavon Paganini,  MD, have reviewed all documentation for this visit. The documentation on 12/22/20 for the exam, diagnosis, procedures, and orders are all accurate and complete.   Bacigalupo, Dionne Bucy, MD, MPH [facility]

## 2020-12-22 ENCOUNTER — Encounter: Payer: Self-pay | Admitting: Family Medicine

## 2020-12-22 ENCOUNTER — Other Ambulatory Visit: Payer: Self-pay

## 2020-12-22 ENCOUNTER — Ambulatory Visit (INDEPENDENT_AMBULATORY_CARE_PROVIDER_SITE_OTHER): Payer: Commercial Managed Care - PPO | Admitting: Family Medicine

## 2020-12-22 VITALS — BP 153/89 | HR 79 | Temp 97.8°F | Wt 202.0 lb

## 2020-12-22 DIAGNOSIS — R7982 Elevated C-reactive protein (CRP): Secondary | ICD-10-CM | POA: Diagnosis not present

## 2020-12-22 DIAGNOSIS — M2550 Pain in unspecified joint: Secondary | ICD-10-CM

## 2020-12-22 DIAGNOSIS — M7062 Trochanteric bursitis, left hip: Secondary | ICD-10-CM | POA: Diagnosis not present

## 2020-12-22 DIAGNOSIS — M791 Myalgia, unspecified site: Secondary | ICD-10-CM | POA: Diagnosis not present

## 2020-12-22 MED ORDER — MELOXICAM 15 MG PO TABS: 15.0000 mg | ORAL_TABLET | Freq: Every day | ORAL | 2 refills | Status: DC

## 2020-12-22 MED ORDER — CYCLOBENZAPRINE HCL 10 MG PO TABS: 10.0000 mg | ORAL_TABLET | Freq: Three times a day (TID) | ORAL | 0 refills | Status: DC | PRN

## 2020-12-22 MED ORDER — ALPRAZOLAM 1 MG PO TABS: 1.0000 mg | ORAL_TABLET | Freq: Every day | ORAL | 0 refills | Status: DC | PRN

## 2020-12-22 NOTE — Patient Instructions (Signed)
Hip Bursitis  Hip bursitis is swelling of one or more fluid-filled sacs (bursae) in your hip joint. This condition can cause pain, and your symptoms may come and go over time. What are the causes?  Repeated use of your hip muscles.  Injury to the hip.  Weak butt muscles.  Bone spurs.  Infection. In some cases, the cause may not be known. What increases the risk? You are more likely to develop this condition if:  You had a past hip injury or hip surgery.  You have a condition, such as arthritis, gout, diabetes, or thyroid disease.  You have spine problems.  You have one leg that is shorter than the other.  You run a lot or do long-distance running.  You play sports where there is a risk of injury or falling, such as football, martial arts, or skiing. What are the signs or symptoms? Symptoms may come and go, and they often include:  Pain in the hip or groin area. Pain may get worse when you move your hip.  Tenderness and swelling of the hip. In rare cases, the bursa may become infected. If this happens, you may get a fever, as well as have warmth and redness in the hip area. How is this treated? This condition is treated by:  Resting your hip.  Icing your hip.  Wrapping the hip area with an elastic bandage (compression wrap).  Keeping the hip raised. Other treatments may include medicine, draining fluid out of the bursa, or using crutches, a cane, or a walker. Surgery may be needed, but this is rare. Long-term treatment may include doing exercises to help your strength and flexibility. It may also include lifestyle changes like losing weight to lessen the strain on your hip. Follow these instructions at home: Managing pain, stiffness, and swelling  If told, put ice on the painful area. ? Put ice in a plastic bag. ? Place a towel between your skin and the bag. ? Leave the ice on for 20 minutes, 2-3 times a day.  Raise your hip by putting a pillow under your hips  while you lie down. Stop if you feel pain.  If told, put heat on the affected area. Do this as often as told by your doctor. Use a moist heat pack or a heating pad as told by your doctor. ? Place a towel between your skin and the heat source. ? Leave the heat on for 20-30 minutes. ? Take off the heat if your skin turns bright red. This is very important if you are unable to feel pain, heat, or cold. You may have a greater risk of getting burned.      Activity  Do not use your hip to support your body weight until your doctor says that you can.  Use crutches, a cane, or a walker as told by your doctor.  If the affected leg is one that you use to drive, ask your doctor if it is safe to drive.  Rest and protect your hip as much as you can until you feel better.  Return to your normal activities as told by your doctor. Ask your doctor what activities are safe for you.  Do exercises as told by your doctor. General instructions  Take over-the-counter and prescription medicines only as told by your doctor.  Gently rub and stretch your injured area as often as is comfortable.  Wear elastic bandages only as told by your doctor.  If one of your legs is  shorter than the other, get fitted for a shoe insert or orthotic.  Keep a healthy weight. Follow instructions from your doctor.  Keep all follow-up visits as told by your doctor. This is important. How is this prevented?  Exercise regularly, as told by your doctor.  Wear the right shoes for the sport you play.  Warm up and stretch before being active. Cool down and stretch after being active.  Take breaks often from repeated activity.  Avoid activities that bother your hip or cause pain.  Avoid sitting down for a long time. Where to find more information  [employer]: orthoinfo.aaos.org Contact a doctor if:  You have a fever.  You have new symptoms.  You have trouble walking or doing everyday  activities.  You have pain that gets worse or does not get better with medicine.  Your skin around your hip is red.  You get a feeling of warmth in your hip area. Get help right away if:  You cannot move your hip.  You have very bad pain.  You cannot control the muscles in your feet. Summary  Hip bursitis is swelling of one or more fluid-filled sacs (bursae) in your hip joint.  Symptoms often come and go over time.  This condition is often treated by resting and icing the hip. It also may help to keep the area raised and wrapped in an elastic bandage. Other treatments may be needed. This information is not intended to replace advice given to you by your health care provider. Make sure you discuss any questions you have with your health care provider. Document Revised: 08/06/2019 Document Reviewed: 06/12/2018 Elsevier Patient Education  2021 [employer].

## 2021-03-15 ENCOUNTER — Other Ambulatory Visit: Payer: Self-pay | Admitting: Family Medicine

## 2021-03-15 NOTE — Telephone Encounter (Signed)
Requested Prescriptions  Pending Prescriptions Disp Refills  . traZODone (DESYREL) 50 MG tablet [Pharmacy Med Name: TRAZODONE 50 MG TABLET] 90 tablet 0    Sig: TAKE 1/2 TO 1 TABLET BY MOUTH AT BEDTIME AS NEEDED FOR SLEEP     Psychiatry: Antidepressants - Serotonin Modulator Passed - 03/15/2021  9:30 AM      Passed - Valid encounter within last 6 months    Recent Outpatient Visits          2 months ago [place], Dionne Bucy, [place]   8 months ago [place], Dionne Bucy, [place]   9 months ago [place], [place], [place]   9 months ago Suspected COVID-19 virus infection   [place], [place], [place]   10 months ago No-show for appointment   [facility] [place], Kelby Aline, [place]      Future Appointments            In 3 weeks [place], Dionne Bucy, MD [facility], [place]

## 2021-04-07 ENCOUNTER — Ambulatory Visit (INDEPENDENT_AMBULATORY_CARE_PROVIDER_SITE_OTHER): Payer: Commercial Managed Care - PPO | Admitting: Family Medicine

## 2021-04-07 ENCOUNTER — Encounter: Payer: Self-pay | Admitting: Family Medicine

## 2021-04-07 ENCOUNTER — Other Ambulatory Visit: Payer: Self-pay

## 2021-04-07 VITALS — BP 149/82 | HR 68 | Temp 97.7°F | Resp 16 | Ht 64.0 in | Wt 202.3 lb

## 2021-04-07 DIAGNOSIS — Z1159 Encounter for screening for other viral diseases: Secondary | ICD-10-CM

## 2021-04-07 DIAGNOSIS — R5382 Chronic fatigue, unspecified: Secondary | ICD-10-CM | POA: Insufficient documentation

## 2021-04-07 DIAGNOSIS — I10 Essential (primary) hypertension: Secondary | ICD-10-CM | POA: Diagnosis not present

## 2021-04-07 DIAGNOSIS — E559 Vitamin D deficiency, unspecified: Secondary | ICD-10-CM | POA: Diagnosis not present

## 2021-04-07 DIAGNOSIS — R0681 Apnea, not elsewhere classified: Secondary | ICD-10-CM

## 2021-04-07 DIAGNOSIS — Z0000 Encounter for general adult medical examination without abnormal findings: Secondary | ICD-10-CM | POA: Diagnosis not present

## 2021-04-07 DIAGNOSIS — G478 Other sleep disorders: Secondary | ICD-10-CM

## 2021-04-07 DIAGNOSIS — Z23 Encounter for immunization: Secondary | ICD-10-CM

## 2021-04-07 DIAGNOSIS — Z6834 Body mass index (BMI) 34.0-34.9, adult: Secondary | ICD-10-CM

## 2021-04-07 DIAGNOSIS — E669 Obesity, unspecified: Secondary | ICD-10-CM | POA: Diagnosis not present

## 2021-04-07 DIAGNOSIS — Z114 Encounter for screening for human immunodeficiency virus [HIV]: Secondary | ICD-10-CM

## 2021-04-07 DIAGNOSIS — R0683 Snoring: Secondary | ICD-10-CM

## 2021-04-07 DIAGNOSIS — F419 Anxiety disorder, unspecified: Secondary | ICD-10-CM

## 2021-04-07 DIAGNOSIS — Z1231 Encounter for screening mammogram for malignant neoplasm of breast: Secondary | ICD-10-CM

## 2021-04-07 MED ORDER — SERTRALINE HCL 100 MG PO TABS: 1.5000 | ORAL_TABLET | Freq: Every day | ORAL | 1 refills | Status: DC

## 2021-04-07 MED ORDER — BUPROPION HCL ER (XL) 150 MG PO TB24: 1.0000 | ORAL_TABLET | Freq: Every day | ORAL | 1 refills | Status: DC

## 2021-04-07 MED ORDER — ALPRAZOLAM 1 MG PO TABS: 1.0000 mg | ORAL_TABLET | Freq: Every day | ORAL | 0 refills | Status: DC | PRN

## 2021-04-07 NOTE — Assessment & Plan Note (Signed)
Previously well controlled, but slight worsening recently requiring additional doses of Zoloft occasionally Increase Zoloft to 150 mg daily Resume Wellbutrin at previous dose Use Xanax very infrequently-have discussed risks of long-term use Encourage therapy

## 2021-04-07 NOTE — Assessment & Plan Note (Signed)
Discussed importance of healthy weight management Discussed diet and exercise  

## 2021-04-07 NOTE — Patient Instructions (Signed)
Preventive Care 54-54 Years Old, Female Preventive care refers to lifestyle choices and visits with your health care provider that can promote health and wellness. This includes: A yearly physical exam. This is also called an annual wellness visit. Regular dental and eye exams. Immunizations. Screening for certain conditions. Healthy lifestyle choices, such as: Eating a healthy diet. Getting regular exercise. Not using drugs or products that contain nicotine and tobacco. Limiting alcohol use. What can I expect for my preventive care visit? Physical exam Your health care provider will check your: Height and weight. These may be used to calculate your BMI (body mass index). BMI is a measurement that tells if you are at a healthy weight. Heart rate and blood pressure. Body temperature. Skin for abnormal spots. Counseling Your health care provider may ask you questions about your: Past medical problems. Family's medical history. Alcohol, tobacco, and drug use. Emotional well-being. Home life and relationship well-being. Sexual activity. Diet, exercise, and sleep habits. Work and work Statistician. Access to firearms. Method of birth control. Menstrual cycle. Pregnancy history. What immunizations do I need?  Vaccines are usually given at various ages, according to a schedule. Your health care provider will recommend vaccines for you based on your age, medicalhistory, and lifestyle or other factors, such as travel or where you work. What tests do I need? Blood tests Lipid and cholesterol levels. These may be checked every 5 years, or more often if you are over 37 years old. Hepatitis C test. Hepatitis B test. Screening Lung cancer screening. You may have this screening every year starting at age 30 if you have a 30-pack-year history of smoking and currently smoke or have quit within the past 15 years. Colorectal cancer screening. All adults should have this screening starting at  age 23 and continuing until age 3. Your health care provider may recommend screening at age 54 if you are at increased risk. You will have tests every 1-10 years, depending on your results and the type of screening test. Diabetes screening. This is done by checking your blood sugar (glucose) after you have not eaten for a while (fasting). You may have this done every 1-3 years. Mammogram. This may be done every 1-2 years. Talk with your health care provider about when you should start having regular mammograms. This may depend on whether you have a family history of breast cancer. BRCA-related cancer screening. This may be done if you have a family history of breast, ovarian, tubal, or peritoneal cancers. Pelvic exam and Pap test. This may be done every 3 years starting at age 79. Starting at age 54, this may be done every 5 years if you have a Pap test in combination with an HPV test. Other tests STD (sexually transmitted disease) testing, if you are at risk. Bone density scan. This is done to screen for osteoporosis. You may have this scan if you are at high risk for osteoporosis. Talk with your health care provider about your test results, treatment options,and if necessary, the need for more tests. Follow these instructions at home: Eating and drinking  Eat a diet that includes fresh fruits and vegetables, whole grains, lean protein, and low-fat dairy products. Take vitamin and mineral supplements as recommended by your health care provider. Do not drink alcohol if: Your health care provider tells you not to drink. You are pregnant, may be pregnant, or are planning to become pregnant. If you drink alcohol: Limit how much you have to 0-1 drink a day. Be aware  of how much alcohol is in your drink. In the [place] one drink equals one 12 oz bottle of beer (355 mL), one 5 oz glass of wine (148 mL), or one 1 oz glass of hard liquor (44 mL).  Lifestyle Take daily care of your teeth and  gums. Brush your teeth every morning and night with fluoride toothpaste. Floss one time each day. Stay active. Exercise for at least 30 minutes 5 or more days each week. Do not use any products that contain nicotine or tobacco, such as cigarettes, e-cigarettes, and chewing tobacco. If you need help quitting, ask your health care provider. Do not use drugs. If you are sexually active, practice safe sex. Use a condom or other form of protection to prevent STIs (sexually transmitted infections). If you do not wish to become pregnant, use a form of birth control. If you plan to become pregnant, see your health care provider for a prepregnancy visit. If told by your health care provider, take low-dose aspirin daily starting at age 29. Find healthy ways to cope with stress, such as: Meditation, yoga, or listening to music. Journaling. Talking to a trusted person. Spending time with friends and family. Safety Always wear your seat belt while driving or riding in a vehicle. Do not drive: If you have been drinking alcohol. Do not ride with someone who has been drinking. When you are tired or distracted. While texting. Wear a helmet and other protective equipment during sports activities. If you have firearms in your house, make sure you follow all gun safety procedures. What's next? Visit your health care provider once a year for an annual wellness visit. Ask your health care provider how often you should have your eyes and teeth checked. Stay up to date on all vaccines. This information is not intended to replace advice given to you by your health care provider. Make sure you discuss any questions you have with your healthcare provider. Document Revised: 07/08/2020 Document Reviewed: 06/15/2018 Elsevier Patient Education  2022 [employer].

## 2021-04-07 NOTE — Assessment & Plan Note (Signed)
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

## 2021-04-07 NOTE — Assessment & Plan Note (Signed)
Chronic and likely multifactorial Concern for possible OSA given other symptoms of nonrestorative sleep, witnessed apneic episodes, and snoring Will send for sleep study Check labs to determine any other underlying culprit

## 2021-04-07 NOTE — Assessment & Plan Note (Signed)
May contribute to fatigue Recheck vitamin D level today Continue supplement

## 2021-04-07 NOTE — Progress Notes (Signed)
Complete physical exam   Patient: Mackenzie Bailey   DOB: 09-22-1966   55 y.o. Female  MRN: [id number] Visit Date: 04/07/2021  Today's healthcare provider: Lavon Paganini, MD   Chief Complaint  Patient presents with   Annual Exam   Subjective     HPI  Mackenzie Bailey is a 55 y.o. female who presents today for a complete physical exam.  She reports consuming a poor diet. She is trying to stay active through the week by walking She generally feels fairly well.   Excessive Fatigue  She does have additional problems to discuss today. Patient reports that sleep habits are very poor, she states that previously it was suggested that she have a sleep study but it was not covered under her insurance, she reports taking Trazodone at bedtime to help with sleep but states that during the day she feels more tired. She does snore at night and her husband has made note that there are moments of interruption in breathing. She occasionally wakes up with headaches. She believes the fatigue to be associated with sleep apnea.   Results of the Epworth flowsheet 01/01/2019  Sitting and reading 3  Watching TV 2  Sitting, inactive in a public place (e.g. a theatre or a meeting) 2  As a passenger in a car for an hour without a break 3  Lying down to rest in the afternoon when circumstances permit 3  Sitting and talking to someone 1  Sitting quietly after a lunch without alcohol 3  In a car, while stopped for a few minutes in traffic 0  Total score 17     Vaccines  She denies receiving the 4th booster of COVID and she prefers to wait. She reports not receiving the shingles vaccine however she is interested in receiving the 1st dosage.   Blood Pressure  Her blood pressure is elevated but she forgot to take her medication yesterday and today. She doesn't check BP at home. She is tolerating 5 mg amlodipine for hypertension.   BP Readings from Last 3 Encounters:  04/07/21 (!) 149/82   12/22/20 (!) 153/89  07/18/20 125/88   Anxiety She takes 100 mg zoloft for anxiety although she occasionally will take 200 mg to see improvement in her anxiety.  She forgets to take 150 mg wellbutrin but she tries to take both in the morning.   Past Medical History:  Diagnosis Date   Allergy    Anxiety    Asthma    SEASONAL    Complication of anesthesia    Female cystocele    GERD (gastroesophageal reflux disease)    OCC   Headache    RARE MIGRAINES   Heart murmur    ASYMPTOMATIC   Hypertension    Menorrhagia    Mitral valve prolapse    PONV (postoperative nausea and vomiting)    WITH FRACTURE SURGERY ONLY   SUI (stress urinary incontinence, female)    Vaginal pessary present    Past Surgical History:  Procedure Laterality Date   ABDOMINAL HYSTERECTOMY     BREAST BIOPSY Right 2012?   benign   BREAST SURGERY     benign biobsy   CHOLECYSTECTOMY     COLONOSCOPY WITH PROPOFOL N/A 02/24/2018   Procedure: COLONOSCOPY WITH PROPOFOL;  Surgeon: Lucilla Lame, MD;  Location: [place];  Service: Endoscopy;  Laterality: N/A;   CYSTOCELE REPAIR N/A 06/14/2019   Procedure: ANTERIOR REPAIR (CYSTOCELE);  Surgeon: Gae Dry, MD;  Location: [facility];  Service: Gynecology;  Laterality: N/A;   CYSTOSCOPY N/A 06/14/2019   Procedure: CYSTOSCOPY;  Surgeon: Gae Dry, MD;  Location: [facility];  Service: Gynecology;  Laterality: N/A;   FRACTURE SURGERY     R lower leg   IUD REMOVAL  06/14/2019   Procedure: INTRAUTERINE DEVICE (IUD) REMOVAL;  Surgeon: Gae Dry, MD;  Location: [facility];  Service: Gynecology;;   LAPAROSCOPIC VAGINAL HYSTERECTOMY WITH SALPINGECTOMY Bilateral 06/14/2019   Procedure: LAPAROSCOPIC ASSISTED VAGINAL HYSTERECTOMY WITH BILATERAL SALPINGECTOMY;  Surgeon: Gae Dry, MD;  Location: [facility];  Service: Gynecology;  Laterality: Bilateral;   POLYPECTOMY     uterine, found on sonohystogram, contributed to menorrhagia    POLYPECTOMY   02/24/2018   Procedure: POLYPECTOMY;  Surgeon: Lucilla Lame, MD;  Location: [place];  Service: Endoscopy;;   PUBOVAGINAL SLING N/A 06/14/2019   Procedure: Gaynelle Arabian;  Surgeon: Gae Dry, MD;  Location: [facility];  Service: Gynecology;  Laterality: N/A;   Social History   Socioeconomic History   Marital status: Married    Spouse name: Phu   Number of children: 1   Years of education: 16   Highest education level: Some college, no degree  Occupational History    Employer: [employer]  Tobacco Use   Smoking status: Never   Smokeless tobacco: Never  Vaping Use   Vaping Use: Never used  Substance and Sexual Activity   Alcohol use: Yes    Alcohol/week: 1.0 - 2.0 standard drink    Types: 1 - 2 Glasses of wine per week    Comment: OCC WINE   Drug use: No   Sexual activity: Yes    Partners: Male    Comment: Mirena  Other Topics Concern   Not on file  Social History Narrative   Not on file   Social Determinants of Health   Financial Resource Strain: Not on file  Food Insecurity: Not on file  Transportation Needs: Not on file  Physical Activity: Not on file  Stress: Not on file  Social Connections: Not on file  Intimate Partner Violence: Not on file   Family Status  Relation Name Status   Father  Alive   Sister  Alive   MGF  Deceased   PGM  Deceased   PGF  Deceased   Mother  Alive   Son  Alive   MGM  Deceased   Neg Hx  (Not Specified)   Family History  Problem Relation Age of Onset   Arthritis Father    Asthma Father    Skin cancer Father        melanoma   Thyroid disease Sister    Uterine cancer Sister    Heart attack Maternal Grandfather    Early death Maternal Grandfather        massive heart attack   Arthritis Paternal Grandmother    Cancer Paternal Grandmother        uterine   Stroke Paternal Grandfather    Hypertension Mother    Colon cancer Neg Hx    Breast cancer Neg Hx    Allergies  Allergen Reactions    Codeine Nausea And Vomiting   Penicillins Hives    Did it involve swelling of the face/tongue/throat, SOB, or low BP? No Did it involve sudden or severe rash/hives, skin peeling, or any reaction on the inside of your mouth or nose? No Did you need to seek medical attention at a hospital or doctor's  office? No When did it last happen?      Childhood allergy If all above answers are "NO", may proceed with cephalosporin use.     Patient Care Team: Virginia Crews, MD as PCP - General (Family Medicine)   Medications: Outpatient Medications Prior to Visit  Medication Sig   albuterol (PROVENTIL) (2.5 MG/3ML) 0.083% nebulizer solution Take 3 mLs (2.5 mg total) by nebulization every 6 (six) hours as needed for wheezing or shortness of breath.   amLODipine (NORVASC) 5 MG tablet TAKE 1 TABLET BY MOUTH EVERY DAY IN THE MORNING   diphenhydramine-acetaminophen (TYLENOL PM) 25-500 MG TABS tablet Take 1-2 tablets by mouth at bedtime as needed (sleep).   esomeprazole (NEXIUM) 20 MG capsule Take 20 mg by mouth daily as needed (heart burn).   ipratropium-albuterol (DUONEB) 0.5-2.5 (3) MG/3ML SOLN Take 3 mLs by nebulization every 6 (six) hours as needed.   ketoconazole (NIZORAL) 2 % shampoo APPLY TO AFFECTED AREA DAILY. LET STAY ON SKIN FOR 5 MINUTES BEFORE WASHING OFF   montelukast (SINGULAIR) 10 MG tablet Take 1 tablet (10 mg total) by mouth at bedtime.   propranolol (INDERAL) 10 MG tablet Take 1 tablet (10 mg total) by mouth 3 (three) times daily as needed (anxiety).   Pseudoeph-Doxylamine-DM-APAP (NYQUIL PO) Take 1 Dose by mouth at bedtime as needed (sleep).   traZODone (DESYREL) 50 MG tablet TAKE 1/2 TO 1 TABLET BY MOUTH AT BEDTIME AS NEEDED FOR SLEEP   [DISCONTINUED] ALPRAZolam (XANAX) 1 MG tablet Take 1 tablet (1 mg total) by mouth daily as needed for anxiety.   [DISCONTINUED] buPROPion (WELLBUTRIN XL) 150 MG 24 hr tablet TAKE 1 TABLET BY MOUTH EVERY DAY   [DISCONTINUED] cyclobenzaprine  (FLEXERIL) 10 MG tablet Take 1 tablet (10 mg total) by mouth 3 (three) times daily as needed for muscle spasms.   [DISCONTINUED] meloxicam (MOBIC) 15 MG tablet Take 1 tablet (15 mg total) by mouth daily.   [DISCONTINUED] phentermine 37.5 MG capsule Take 1 capsule (37.5 mg total) by mouth every morning.   [DISCONTINUED] sertraline (ZOLOFT) 100 MG tablet TAKE 1 TABLET BY MOUTH EVERY DAY   No facility-administered medications prior to visit.    Review of Systems  Constitutional:  Positive for fatigue. Negative for chills and fever.  HENT:  Negative for ear pain, nosebleeds, sinus pressure, sinus pain and sore throat.   Eyes:  Negative for pain.  Respiratory:  Negative for apnea, cough, chest tightness, shortness of breath and wheezing.   Cardiovascular:  Negative for chest pain, palpitations and leg swelling.  Gastrointestinal:  Negative for abdominal pain, blood in stool, constipation, diarrhea, nausea and vomiting.  Endocrine: Negative.   Genitourinary: Negative.  Negative for dysuria, flank pain, frequency, pelvic pain and urgency.  Musculoskeletal:  Negative for back pain, myalgias and neck pain.  Skin: Negative.   Neurological:  Positive for headaches (occassional). Negative for dizziness, seizures, weakness, light-headedness and numbness.  All other systems reviewed and are negative.  Last CBC Lab Results  Component Value Date   WBC 7.0 07/18/2020   HGB 12.9 07/18/2020   HCT 39.4 07/18/2020   MCV 82 07/18/2020   MCH 26.7 07/18/2020   RDW 14.6 07/18/2020   PLT 313 69/62/9528   Last metabolic panel Lab Results  Component Value Date   GLUCOSE 92 07/18/2020   NA 138 07/18/2020   K 4.2 07/18/2020   CL 101 07/18/2020   CO2 23 07/18/2020   BUN 9 07/18/2020   CREATININE 0.62 07/18/2020  GFRNONAA 104 07/18/2020   GFRAA 120 07/18/2020   CALCIUM 9.6 07/18/2020   PROT 6.9 07/18/2020   ALBUMIN 4.2 07/18/2020   LABGLOB 2.7 07/18/2020   AGRATIO 1.6 07/18/2020   BILITOT 0.2  07/18/2020   ALKPHOS 102 07/18/2020   AST 12 07/18/2020   ALT 16 07/18/2020   ANIONGAP 13 05/28/2020   Last lipids Lab Results  Component Value Date   CHOL 162 03/27/2018   HDL 53 03/27/2018   LDLCALC 98 03/27/2018   TRIG 53 03/27/2018   CHOLHDL 3.1 03/27/2018   Last thyroid functions Lab Results  Component Value Date   TSH 1.590 07/18/2020   Last vitamin D Lab Results  Component Value Date   VD25OH 13.3 (L) 01/01/2019   Last vitamin B12 and Folate Lab Results  Component Value Date   VITAMINB12 349 01/28/2020   FOLATE 3.4 01/28/2020      Objective    BP (!) 149/82   Pulse 68   Temp 97.7 F (36.5 C) (Oral)   Resp 16   Ht 5\' 4"  (1.626 m)   Wt 202 lb 4.8 oz (91.8 kg)   LMP 05/09/2019 (Exact Date)   SpO2 98%   BMI 34.72 kg/m  BP Readings from Last 3 Encounters:  04/07/21 (!) 149/82  12/22/20 (!) 153/89  07/18/20 125/88   Wt Readings from Last 3 Encounters:  04/07/21 202 lb 4.8 oz (91.8 kg)  12/22/20 202 lb (91.6 kg)  07/18/20 191 lb (86.6 kg)    Physical Exam Vitals reviewed.  Constitutional:      General: She is not in acute distress.    Appearance: Normal appearance. She is well-developed. She is not diaphoretic.  HENT:     Head: Normocephalic and atraumatic.     Right Ear: Tympanic membrane, ear canal and external ear normal.     Left Ear: Tympanic membrane, ear canal and external ear normal.     Nose: Nose normal.     Mouth/Throat:     Mouth: Mucous membranes are moist.     Pharynx: Oropharynx is clear. No oropharyngeal exudate.  Eyes:     General: No scleral icterus.    Conjunctiva/sclera: Conjunctivae normal.     Pupils: Pupils are equal, round, and reactive to light.  Neck:     Thyroid: No thyromegaly.  Cardiovascular:     Rate and Rhythm: Normal rate and regular rhythm.     Pulses: Normal pulses.     Heart sounds: Normal heart sounds. No murmur heard. Pulmonary:     Effort: Pulmonary effort is normal. No respiratory distress.      Breath sounds: Normal breath sounds. No wheezing or rales.  Abdominal:     General: There is no distension.     Palpations: Abdomen is soft.     Tenderness: There is no abdominal tenderness.  Musculoskeletal:        General: No deformity.     Cervical back: Neck supple.     Right lower leg: No edema.     Left lower leg: No edema.  Lymphadenopathy:     Cervical: No cervical adenopathy.  Skin:    General: Skin is warm and dry.     Findings: No rash.  Neurological:     Mental Status: She is alert and oriented to person, place, and time. Mental status is at baseline.     Sensory: No sensory deficit.     Motor: No weakness.     Gait: Gait normal.  Psychiatric:  Mood and Affect: Mood normal.        Behavior: Behavior normal.        Thought Content: Thought content normal.     Last depression screening scores PHQ 2/9 Scores 04/07/2021 12/22/2020 02/18/2020  PHQ - 2 Score 1 3 2   PHQ- 9 Score 7 12 5    Last fall risk screening Fall Risk  04/07/2021  Falls in the past year? 0  Number falls in past yr: 0  Injury with Fall? 0  Risk for fall due to : -  Follow up -   Last Audit-C alcohol use screening Alcohol Use Disorder Test (AUDIT) 04/07/2021  1. How often do you have a drink containing alcohol? 3  2. How many drinks containing alcohol do you have on a typical day when you are drinking? 0  3. How often do you have six or more drinks on one occasion? 0  AUDIT-C Score 3   A score of 3 or more in women, and 4 or more in men indicates increased risk for alcohol abuse, EXCEPT if all of the points are from question 1   No results found for any visits on 04/07/21.  Assessment & Plan     Problem List Items Addressed This Visit       Cardiovascular and Mediastinum   Essential hypertension    Typically well controlled, but elevated today Patient has not had her medications for the last 2 days, which is likely contributing Advised to check blood pressure at work with one of the  nurse lives Continue current medications Recheck metabolic panel Follow-up in 6 months or sooner if blood pressure is not better controlled at home       Relevant Orders   Comprehensive metabolic panel   Lipid Panel With LDL/HDL Ratio   Ambulatory referral to Sleep Studies     Other   Anxiety    Previously well controlled, but slight worsening recently requiring additional doses of Zoloft occasionally Increase Zoloft to 150 mg daily Resume Wellbutrin at previous dose Use Xanax very infrequently-have discussed risks of long-term use Encourage therapy       Relevant Medications   sertraline (ZOLOFT) 100 MG tablet   ALPRAZolam (XANAX) 1 MG tablet   buPROPion (WELLBUTRIN XL) 150 MG 24 hr tablet   Obesity    Discussed importance of healthy weight management Discussed diet and exercise        Relevant Orders   Lipid Panel With LDL/HDL Ratio   Ambulatory referral to Sleep Studies   Avitaminosis D    May contribute to fatigue Recheck vitamin D level today Continue supplement       Relevant Orders   VITAMIN D 25 Hydroxy (Vit-D Deficiency, Fractures)   Chronic fatigue    Chronic and likely multifactorial Concern for possible OSA given other symptoms of nonrestorative sleep, witnessed apneic episodes, and snoring Will send for sleep study Check labs to determine any other underlying culprit       Relevant Orders   Ambulatory referral to Sleep Studies   B12   CBC w/Diff/Platelet   TSH   Other Visit Diagnoses     Encounter for annual health examination    -  Primary   Relevant Orders   Comprehensive metabolic panel   Hepatitis C antibody   Lipid Panel With LDL/HDL Ratio   B12   CBC w/Diff/Platelet   TSH   MM 3D SCREEN BREAST BILATERAL   Encounter for hepatitis C screening test for low risk  patient       Relevant Orders   Hepatitis C antibody   Screening for HIV (human immunodeficiency virus)       Relevant Orders   HIV Antibody (routine testing w rflx)    Encounter for screening mammogram for malignant neoplasm of breast       Relevant Orders   MM 3D SCREEN BREAST BILATERAL   Non-restorative sleep       Relevant Orders   Ambulatory referral to Sleep Studies   Snoring       Relevant Orders   Ambulatory referral to Sleep Studies   Witnessed apneic spells       Relevant Orders   Ambulatory referral to Sleep Studies   Need for zoster vaccination       Relevant Orders   Varicella-zoster vaccine IM (Completed)       Routine Health Maintenance and Physical Exam  Exercise Activities and Dietary recommendations  Goals   None     Immunization History  Administered Date(s) Administered   Influenza,inj,Quad PF,6+ Mos 08/13/2013   Influenza-Unspecified 08/16/2018, 07/20/2019   Moderna Sars-Covid-2 Vaccination 10/29/2019, 11/26/2019   Tdap 01/15/2020   Zoster Recombinat (Shingrix) 04/07/2021    Health Maintenance  Topic Date Due   Hepatitis C Screening  Never done   COVID-19 Vaccine (3 - Booster for Moderna series) 04/24/2020   MAMMOGRAM  04/16/2021   INFLUENZA VACCINE  05/18/2021   Zoster Vaccines- Shingrix (2 of 2) 06/02/2021   COLONOSCOPY (Pts 45-96yrs Insurance coverage will need to be confirmed)  02/25/2023   TETANUS/TDAP  01/14/2030   HIV Screening  Completed   Pneumococcal Vaccine 52-27 Years old  Aged Out   HPV VACCINES  Aged Out    Discussed health benefits of physical activity, and encouraged her to engage in regular exercise appropriate for her age and condition.   Return in about 6 months (around 10/07/2021) for chronic disease f/u.      I,Essence Turner,acting as a Education administrator for Lavon Paganini, MD.,have documented all relevant documentation on the behalf of Lavon Paganini, MD,as directed by  Lavon Paganini, MD while in the presence of Lavon Paganini, MD.   I, Lavon Paganini, MD, have reviewed all documentation for this visit. The documentation on 04/07/21 for the exam, diagnosis, procedures, and  orders are all accurate and complete.   Witney Huie, Dionne Bucy, MD, MPH [facility]

## 2021-04-08 LAB — COMPREHENSIVE METABOLIC PANEL
ALT: 18 IU/L (ref 0–32)
AST: 16 IU/L (ref 0–40)
Albumin/Globulin Ratio: 1.6 (ref 1.2–2.2)
Albumin: 4.1 g/dL (ref 3.8–4.9)
Alkaline Phosphatase: 91 IU/L (ref 44–121)
BUN/Creatinine Ratio: 19 (ref 9–23)
BUN: 10 mg/dL (ref 6–24)
Bilirubin Total: 0.2 mg/dL (ref 0.0–1.2)
CO2: 24 mmol/L (ref 20–29)
Calcium: 9.6 mg/dL (ref 8.7–10.2)
Chloride: 101 mmol/L (ref 96–106)
Creatinine, Ser: 0.52 mg/dL — ABNORMAL LOW (ref 0.57–1.00)
Globulin, Total: 2.5 g/dL (ref 1.5–4.5)
Glucose: 90 mg/dL (ref 65–99)
Potassium: 3.8 mmol/L (ref 3.5–5.2)
Sodium: 141 mmol/L (ref 134–144)
Total Protein: 6.6 g/dL (ref 6.0–8.5)
eGFR: 111 mL/min/{1.73_m2} (ref >59)

## 2021-04-08 LAB — CBC WITH DIFFERENTIAL/PLATELET
Basophils Absolute: 0 10*3/uL (ref 0.0–0.2)
Basos: 1 %
EOS (ABSOLUTE): 0.1 10*3/uL (ref 0.0–0.4)
Eos: 1 %
Hematocrit: 39.3 % (ref 34.0–46.6)
Hemoglobin: 13.2 g/dL (ref 11.1–15.9)
Immature Grans (Abs): 0 10*3/uL (ref 0.0–0.1)
Immature Granulocytes: 0 %
Lymphocytes Absolute: 2.3 10*3/uL (ref 0.7–3.1)
Lymphs: 37 %
MCH: 27.1 pg (ref 26.6–33.0)
MCHC: 33.6 g/dL (ref 31.5–35.7)
MCV: 81 fL (ref 79–97)
Monocytes Absolute: 0.3 10*3/uL (ref 0.1–0.9)
Monocytes: 6 %
Neutrophils Absolute: 3.4 10*3/uL (ref 1.4–7.0)
Neutrophils: 55 %
Platelets: 239 10*3/uL (ref 150–450)
RBC: 4.87 x10E6/uL (ref 3.77–5.28)
RDW: 14.7 % (ref 11.7–15.4)
WBC: 6.1 10*3/uL (ref 3.4–10.8)

## 2021-04-08 LAB — HIV ANTIBODY (ROUTINE TESTING W REFLEX): HIV Screen 4th Generation wRfx: NONREACTIVE

## 2021-04-08 LAB — LIPID PANEL WITH LDL/HDL RATIO
Cholesterol, Total: 192 mg/dL (ref 100–199)
HDL: 74 mg/dL (ref >39)
LDL Chol Calc (NIH): 102 mg/dL — ABNORMAL HIGH (ref 0–99)
LDL/HDL Ratio: 1.4 ratio (ref 0.0–3.2)
Triglycerides: 91 mg/dL (ref 0–149)
VLDL Cholesterol Cal: 16 mg/dL (ref 5–40)

## 2021-04-08 LAB — TSH: TSH: 1.56 u[IU]/mL (ref 0.450–4.500)

## 2021-04-08 LAB — VITAMIN D 25 HYDROXY (VIT D DEFICIENCY, FRACTURES): Vit D, 25-Hydroxy: 18.9 ng/mL — ABNORMAL LOW (ref 30.0–100.0)

## 2021-04-08 LAB — VITAMIN B12: Vitamin B-12: 335 pg/mL (ref 232–1245)

## 2021-04-08 LAB — HEPATITIS C ANTIBODY: Hep C Virus Ab: 0.2 s/co ratio (ref 0.0–0.9)

## 2021-04-10 ENCOUNTER — Encounter: Payer: Self-pay | Admitting: Family Medicine

## 2021-04-13 NOTE — Telephone Encounter (Signed)
Yes. Please send in ergocalciferol 50000 units q7 days #12 r1. Thanks

## 2021-04-14 ENCOUNTER — Encounter: Payer: Self-pay | Admitting: Family Medicine

## 2021-04-14 DIAGNOSIS — I10 Essential (primary) hypertension: Secondary | ICD-10-CM

## 2021-04-14 MED ORDER — VITAMIN D (ERGOCALCIFEROL) 1.25 MG (50000 UNIT) PO CAPS: 50000.0000 [IU] | ORAL_CAPSULE | ORAL | 1 refills | Status: DC

## 2021-04-14 NOTE — Telephone Encounter (Signed)
Let's increase amlodipine to 10mg  daily (send new Rx), because it is not enough meds.

## 2021-04-15 ENCOUNTER — Other Ambulatory Visit: Payer: Self-pay

## 2021-04-15 MED ORDER — AMLODIPINE BESYLATE 10 MG PO TABS: 10.0000 mg | ORAL_TABLET | Freq: Every day | ORAL | 5 refills | Status: DC

## 2021-04-29 ENCOUNTER — Ambulatory Visit
Admission: RE | Admit: 2021-04-29 | Discharge: 2021-04-29 | Disposition: A | Discharge status: Home / Self Care | Payer: Commercial Managed Care - PPO | Source: Ambulatory Visit | Attending: Family Medicine | Admitting: Family Medicine

## 2021-04-29 ENCOUNTER — Other Ambulatory Visit: Payer: Self-pay

## 2021-04-29 DIAGNOSIS — Z1231 Encounter for screening mammogram for malignant neoplasm of breast: Secondary | ICD-10-CM | POA: Diagnosis present

## 2021-04-29 DIAGNOSIS — Z0000 Encounter for general adult medical examination without abnormal findings: Secondary | ICD-10-CM

## 2021-06-15 ENCOUNTER — Ambulatory Visit: Payer: Commercial Managed Care - PPO | Admitting: Neurology

## 2021-06-15 ENCOUNTER — Encounter: Payer: Self-pay | Admitting: Neurology

## 2021-06-15 VITALS — BP 135/81 | HR 80 | Ht 64.0 in | Wt 196.0 lb

## 2021-06-15 DIAGNOSIS — E6609 Other obesity due to excess calories: Secondary | ICD-10-CM | POA: Diagnosis not present

## 2021-06-15 DIAGNOSIS — R0683 Snoring: Secondary | ICD-10-CM

## 2021-06-15 DIAGNOSIS — N951 Menopausal and female climacteric states: Secondary | ICD-10-CM

## 2021-06-15 DIAGNOSIS — Z6833 Body mass index (BMI) 33.0-33.9, adult: Secondary | ICD-10-CM

## 2021-06-15 DIAGNOSIS — G4719 Other hypersomnia: Secondary | ICD-10-CM

## 2021-06-15 DIAGNOSIS — R5382 Chronic fatigue, unspecified: Secondary | ICD-10-CM

## 2021-06-15 NOTE — Patient Instructions (Signed)

## 2021-06-15 NOTE — Progress Notes (Signed)
SLEEP MEDICINE CLINIC    Provider:  Larey Seat, MD  Primary Care Physician:  Virginia Crews, MD [address] [place] [address]     Referring Provider: Virginia Crews, [place] [address],  [place] [address]          Chief Complaint according to patient   Patient presents with:     New Patient (Initial Visit)           HISTORY OF PRESENT ILLNESS:  Mackenzie Bailey is a 55 y.o. Caucasian female patient seen here as a referral on 06/15/2021 from Dr Revonda Standard for a progression of daytime sleepiness and fatigue, weight gain and menopause related changes. .  Chief concern according to patient : " I am dozing off "   I have the pleasure of seeing Mackenzie Bailey [birth year]21, a right-handed White or Caucasian female with a possible sleep disorder.  She has a past medical history of Allergy, Anxiety, Asthma, Complication of anesthesia, Female cystocele, GERD (gastroesophageal reflux disease), Headache, Heart murmur, Hypertension, Menorrhagia, Mitral valve prolapse, PONV (postoperative nausea and vomiting), SUI (stress urinary incontinence, female), and Vaginal pessary present..   Sleep relevant medical history: Nocturia 2-4 times,  Obesity, menopause.   Family medical /sleep history: father on CPAP with OSA. Social history:  Patient is working as Engineer, site at [employer],  and lives in a household with spouse,  one son in college.   The patient currently works/daytime.  Tobacco use/ .  ETOH use yes- wine with dinner, socially,  Caffeine intake in form of Coffee( 1 cup in AM ) Soda( coke) Tea ( /) or energy drinks. Exercise in form of walking.         Sleep habits are as follows: "I fall asleep once I get home , "The patient's dinner time is between 5-7 PM. The patient goes to bed at 9.30 PM and continues to sleep for intervals of 2 hours, wakes for many bathroom breaks, she wakes up form her own snore.  Sleeps in a separate room. The preferred sleep position is sideways and supine , with the support of 1 pillow. Bedroom is cool, quiet and dark, fans running.  Dreams are reportedly rare, can't remember many dreams.    6.30-7 AM is the usual rise time. The patient wakes up when her dog wakes her, she is lately often sleeping through. This started like 2-3 months ago.  She reports not feeling refreshed or restored in AM, with symptoms such as dry mouth, rare morning headaches, and residual fatigue.  Naps are taken frequently, lasting from 1-3 hours, as  refreshing as nocturnal sleep.    Review of Systems: Out of a complete 14 system review, the patient complains of only the following symptoms, and all other reviewed systems are negative.:  Fatigue, sleepiness , snoring, fragmented sleep, Insomnia -menopausal symptoms.   Hysterectomy in 2021.    How likely are you to doze in the following situations: 0 = not likely, 1 = slight chance, 2 = moderate chance, 3 = high chance   Sitting and Reading? Watching Television? Sitting inactive in a public place (theater or meeting)? As a passenger in a car for an hour without a break? Lying down in the afternoon when circumstances permit? Sitting and talking to someone? Sitting quietly after lunch without alcohol? In a car, while stopped for a few minutes in traffic?   Total = 19/ 24 points  FSS endorsed at 47/ 63 points.   Social History   Socioeconomic History   Marital status: Married    Spouse name: Phu   Number of children: 1   Years of education: 16   Highest education level: Some college, no degree  Occupational History    Employer: [employer]  Tobacco Use   Smoking status: Never   Smokeless tobacco: Never  Vaping Use   Vaping Use: Never used  Substance and Sexual Activity   Alcohol use: Yes    Alcohol/week: 1.0 - 2.0 standard drink    Types: 1 - 2 Glasses of wine per week    Comment: OCC WINE   Drug use: No   Sexual  activity: Yes    Partners: Male    Comment: Mirena  Other Topics Concern   Not on file  Social History Narrative   Lives with husband and son when home for the summer   Right handed   Caffeine: 1 cup of coffee a day, and occasionally a soda   Social Determinants of Radio broadcast assistant Strain: Not on file  Food Insecurity: Not on file  Transportation Needs: Not on file  Physical Activity: Not on file  Stress: Not on file  Social Connections: Not on file    Family History  Problem Relation Age of Onset   Arthritis Father    Asthma Father    Skin cancer Father        melanoma   Thyroid disease Sister    Uterine cancer Sister    Heart attack Maternal Grandfather    Early death Maternal Grandfather        massive heart attack   Arthritis Paternal Grandmother    Cancer Paternal Grandmother        uterine   Stroke Paternal Grandfather    Hypertension Mother    Colon cancer Neg Hx    Breast cancer Neg Hx     Past Medical History:  Diagnosis Date   Allergy    Anxiety    Asthma    SEASONAL    Complication of anesthesia    Female cystocele    GERD (gastroesophageal reflux disease)    OCC   Headache    RARE MIGRAINES   Heart murmur    ASYMPTOMATIC   Hypertension    Menorrhagia    Mitral valve prolapse    PONV (postoperative nausea and vomiting)    WITH FRACTURE SURGERY ONLY   SUI (stress urinary incontinence, female)    Vaginal pessary present  Delta variant COVID 19, June 2021. Asthma exacerbated.      Past Surgical History:  Procedure Laterality Date   ABDOMINAL HYSTERECTOMY     BREAST BIOPSY Right 2012?   benign   BREAST SURGERY     benign biobsy   CHOLECYSTECTOMY     COLONOSCOPY WITH PROPOFOL N/A 02/24/2018   Procedure: COLONOSCOPY WITH PROPOFOL;  Surgeon: Lucilla Lame, MD;  Location: [facility];  Service: Endoscopy;  Laterality: N/A;   CYSTOCELE REPAIR N/A 06/14/2019   Procedure: ANTERIOR REPAIR (CYSTOCELE);  Surgeon: Gae Dry, MD;  Location: [facility];  Service: Gynecology;  Laterality: N/A;   CYSTOSCOPY N/A 06/14/2019   Procedure: CYSTOSCOPY;  Surgeon: Gae Dry, MD;  Location: [facility];  Service: Gynecology;  Laterality: N/A;   FRACTURE SURGERY     R lower leg   IUD REMOVAL  06/14/2019   Procedure: INTRAUTERINE DEVICE (IUD) REMOVAL;  Surgeon: Gae Dry,  MD;  Location: [facility];  Service: Gynecology;;   LAPAROSCOPIC VAGINAL HYSTERECTOMY WITH SALPINGECTOMY Bilateral 06/14/2019   Procedure: LAPAROSCOPIC ASSISTED VAGINAL HYSTERECTOMY WITH BILATERAL SALPINGECTOMY;  Surgeon: Gae Dry, MD;  Location: [facility];  Service: Gynecology;  Laterality: Bilateral;   POLYPECTOMY     uterine, found on sonohystogram, contributed to menorrhagia    POLYPECTOMY  02/24/2018   Procedure: POLYPECTOMY;  Surgeon: Lucilla Lame, MD;  Location: [facility];  Service: Endoscopy;;   PUBOVAGINAL SLING N/A 06/14/2019   Procedure: Gaynelle Arabian;  Surgeon: Gae Dry, MD;  Location: [facility];  Service: Gynecology;  Laterality: N/A;     Current Outpatient Medications on File Prior to Visit  Medication Sig Dispense Refill   albuterol (PROVENTIL) (2.5 MG/3ML) 0.083% nebulizer solution Take 3 mLs (2.5 mg total) by nebulization every 6 (six) hours as needed for wheezing or shortness of breath. 75 mL 12   ALPRAZolam (XANAX) 1 MG tablet Take 1 tablet (1 mg total) by mouth daily as needed for anxiety. 30 tablet 0   amLODipine (NORVASC) 10 MG tablet Take 1 tablet (10 mg total) by mouth daily. 30 tablet 5   buPROPion (WELLBUTRIN XL) 150 MG 24 hr tablet Take 1 tablet (150 mg total) by mouth daily. 90 tablet 1   diphenhydramine-acetaminophen (TYLENOL PM) 25-500 MG TABS tablet Take 1-2 tablets by mouth at bedtime as needed (sleep).     esomeprazole (NEXIUM) 20 MG capsule Take 20 mg by mouth daily as needed (heart burn).     ipratropium-albuterol (DUONEB) 0.5-2.5 (3) MG/3ML SOLN Take 3 mLs by nebulization every 6 (six)  hours as needed. 360 mL 1   ketoconazole (NIZORAL) 2 % shampoo APPLY TO AFFECTED AREA DAILY. LET STAY ON SKIN FOR 5 MINUTES BEFORE WASHING OFF 120 mL 1   montelukast (SINGULAIR) 10 MG tablet Take 1 tablet (10 mg total) by mouth at bedtime. 90 tablet 3   propranolol (INDERAL) 10 MG tablet Take 1 tablet (10 mg total) by mouth 3 (three) times daily as needed (anxiety). 30 tablet 2   Pseudoeph-Doxylamine-DM-APAP (NYQUIL PO) Take 1 Dose by mouth at bedtime as needed (sleep).     sertraline (ZOLOFT) 100 MG tablet Take 1.5 tablets (150 mg total) by mouth daily. 135 tablet 1   traZODone (DESYREL) 50 MG tablet TAKE 1/2 TO 1 TABLET BY MOUTH AT BEDTIME AS NEEDED FOR SLEEP 90 tablet 0   Vitamin D, Ergocalciferol, (DRISDOL) 1.25 MG (50000 UNIT) CAPS capsule Take 1 capsule (50,000 Units total) by mouth every 7 (seven) days. 12 capsule 1   No current facility-administered medications on file prior to visit.    Allergies  Allergen Reactions   Codeine Nausea And Vomiting   Penicillins Hives    Did it involve swelling of the face/tongue/throat, SOB, or low BP? No Did it involve sudden or severe rash/hives, skin peeling, or any reaction on the inside of your mouth or nose? No Did you need to seek medical attention at a hospital or doctor's office? No When did it last happen?      Childhood allergy If all above answers are "NO", may proceed with cephalosporin use.     Physical exam:  Today's Vitals   06/15/21 0839  BP: 135/81  Pulse: 80  Weight: 196 lb (88.9 kg)  Height: '5\' 4"'$  (1.626 m)   Body mass index is 33.64 kg/m.   Wt Readings from Last 3 Encounters:  06/15/21 196 lb (88.9 kg)  04/07/21 202 lb 4.8 oz (91.8  kg)  12/22/20 202 lb (91.6 kg)     Ht Readings from Last 3 Encounters:  06/15/21 '5\' 4"'$  (1.626 m)  04/07/21 '5\' 4"'$  (1.626 m)  01/28/20 '5\' 4"'$  (1.626 m)      General: The patient is awake, alert and appears not in acute distress. The patient is well groomed. Head: Normocephalic,  atraumatic. Neck is supple. Mallampati 3, small, very small jaw and oral opening.  neck circumference:16 inches . Nasal airflow patent.  Retrognathia is not seen.  Dental status: crowded. Cardiovascular:  Regular rate and cardiac rhythm by pulse,  without distended neck veins. Respiratory: Lungs are clear to auscultation.  Skin:  Without evidence of ankle edema, or rash. Plate and screws in the right ankle.  Trunk: The patient's posture is erect.   Neurologic exam : The patient is awake and alert, oriented to place and time.   Memory subjective described as intact.  Attention span & concentration ability appears normal.  Speech is fluent,  without  dysarthria, dysphonia or aphasia.  Mood and affect are appropriate.   Cranial nerves: no loss of smell or taste reported  Pupils are equal and briskly reactive to light. Funduscopic exam deferred. .  Extraocular movements in vertical and horizontal planes were intact and without nystagmus. No Diplopia. Visual fields by finger perimetry are intact. Hearing was intact to soft voice and finger rubbing.    Facial sensation intact to fine touch.  Facial motor strength is symmetric and tongue and uvula move midline.  Neck ROM : rotation, tilt and flexion extension were normal for age and shoulder shrug was symmetrical.    Motor exam:  Symmetric bulk, tone and ROM.   Normal tone without cog wheeling, symmetric grip strength .   Sensory:  Fine touch, pinprick and vibration were tested  and  normal.  Proprioception tested in the upper extremities was normal.   Coordination: Rapid alternating movements in the fingers/hands were of normal speed.  The Finger-to-nose maneuver was intact without evidence of ataxia, dysmetria or tremor.   Gait and station: Patient could rise unassisted from a seated position, walked without assistive device.  Stance is of normal width/ base and the patient turned with 3 steps.  Toe and heel walk were deferred.  Deep  tendon reflexes: in the  upper and lower extremities are symmetric and intact.  Babinski response was deferred.       After spending a total time of  35  minutes face to face and additional time for physical and neurologic examination, review of laboratory studies,  personal review of imaging studies, reports and results of other testing and review of referral information / records as far as provided in visit, I have established the following assessments:  1)  Mrs. Sprankle ( "Lay") has  risk factors for OSA in form of BMI - and she has a very small mouth and nose. Witnessed apnea, snoring, elevated Blood Pressure.  2)EDS, excessive daytime sleepiness- EPOWRTH SCORE is very high  3) non refreshing sleep , menopausal symptoms.  4) postviral fatigue, hair loss, covid 05 April 2020.    My Plan is to proceed with:  1)HST to screen for apnea, not likely affected by narcolepsy giving lack of cataplexy, vivid dreams, sleep paralysis.    I would like to thank Brita Romp Dionne Bucy, MD  for allowing me to meet with and to take care of this pleasant patient.   In short, Mackenzie Bailey is presenting with excessive daytime sleepiness- , a  symptom that can be attributed to untreated OSA.   I plan to follow up either personally or through our NP within 2-4  month.   CC: I will share my notes with PCP. Marland Kitchen  Electronically signed by: Larey Seat, MD 06/15/2021 9:32 AM  [facility] Board certified by The [employer] of Sleep Medicine and Diplomate of the [employer] of Sleep Medicine. Board certified In Neurology through the [place], Fellow of the [employer] of Neurology. Medical Director of [facility].

## 2021-07-13 ENCOUNTER — Telehealth: Payer: Self-pay

## 2021-07-13 NOTE — Telephone Encounter (Signed)
LVM for pt to call me back to schedule sleep study  

## 2021-07-22 ENCOUNTER — Ambulatory Visit: Payer: Commercial Managed Care - PPO | Admitting: Dermatology

## 2021-07-27 ENCOUNTER — Other Ambulatory Visit: Payer: Self-pay | Admitting: Family Medicine

## 2021-07-27 NOTE — Telephone Encounter (Signed)
Requested Prescriptions  Pending Prescriptions Disp Refills  . traZODone (DESYREL) 50 MG tablet [Pharmacy Med Name: TRAZODONE 50 MG TABLET] 90 tablet 0    Sig: TAKE 1/2 TO 1 TABLET BY MOUTH AT BEDTIME AS NEEDED FOR SLEEP     Psychiatry: Antidepressants - Serotonin Modulator Passed - 07/27/2021  4:04 AM      Passed - Valid encounter within last 6 months    Recent Outpatient Visits          3 months ago Encounter for annual health examination   [facility], Dionne Bucy, MD   7 months ago [place], Dionne Bucy, MD   1 year ago [place], Dionne Bucy, MD   1 year ago [place], [place], [place]   1 year ago Suspected COVID-19 virus infection   [place], Wendee [place], [place]      Future Appointments            In 3 weeks Laurence Ferrari, [place], MD [place]   In 2 months [place], Dionne Bucy, MD [facility], [place]

## 2021-08-20 ENCOUNTER — Ambulatory Visit: Payer: Commercial Managed Care - PPO | Admitting: Dermatology

## 2021-08-20 ENCOUNTER — Other Ambulatory Visit: Payer: Self-pay

## 2021-08-20 ENCOUNTER — Encounter: Payer: Self-pay | Admitting: Dermatology

## 2021-08-20 DIAGNOSIS — L905 Scar conditions and fibrosis of skin: Secondary | ICD-10-CM | POA: Diagnosis not present

## 2021-08-20 DIAGNOSIS — Z1283 Encounter for screening for malignant neoplasm of skin: Secondary | ICD-10-CM

## 2021-08-20 DIAGNOSIS — R21 Rash and other nonspecific skin eruption: Secondary | ICD-10-CM

## 2021-08-20 DIAGNOSIS — D225 Melanocytic nevi of trunk: Secondary | ICD-10-CM | POA: Diagnosis not present

## 2021-08-20 DIAGNOSIS — L820 Inflamed seborrheic keratosis: Secondary | ICD-10-CM

## 2021-08-20 DIAGNOSIS — L910 Hypertrophic scar: Secondary | ICD-10-CM | POA: Diagnosis not present

## 2021-08-20 DIAGNOSIS — D2262 Melanocytic nevi of left upper limb, including shoulder: Secondary | ICD-10-CM

## 2021-08-20 DIAGNOSIS — L821 Other seborrheic keratosis: Secondary | ICD-10-CM

## 2021-08-20 DIAGNOSIS — L814 Other melanin hyperpigmentation: Secondary | ICD-10-CM

## 2021-08-20 DIAGNOSIS — D229 Melanocytic nevi, unspecified: Secondary | ICD-10-CM

## 2021-08-20 DIAGNOSIS — Z86018 Personal history of other benign neoplasm: Secondary | ICD-10-CM

## 2021-08-20 DIAGNOSIS — L578 Other skin changes due to chronic exposure to nonionizing radiation: Secondary | ICD-10-CM

## 2021-08-20 DIAGNOSIS — D239 Other benign neoplasm of skin, unspecified: Secondary | ICD-10-CM

## 2021-08-20 DIAGNOSIS — D1801 Hemangioma of skin and subcutaneous tissue: Secondary | ICD-10-CM

## 2021-08-20 DIAGNOSIS — D485 Neoplasm of uncertain behavior of skin: Secondary | ICD-10-CM

## 2021-08-20 HISTORY — DX: Other benign neoplasm of skin, unspecified: D23.9

## 2021-08-20 NOTE — Patient Instructions (Addendum)
WARTPEEL INSTRUCTIONS FOR TREATING SEBORRHEIC KERATOSES  WartPEEL is a special compounded prescription medicine (5-fluorouracil/salicylic acid) that can be used to treat seborrheic keratoses ("Wisdom Spots"). It is not paid for by insurance. To obtain your medication, please call the pharmacy below, and they will ship it to your house.  - It should be applied to the raised seborrheic keratosis once a day at night and covered with a bandage. Do not apply it to completely flat spots (spots you cannot feel). - Care should be taken to avoid applying it to the normal skin in order to avoid irritation.  - It should not be used by pregnant women.  - It should not be used for more than 4 days at a time. - You should stop applying it immediately if you develop pain or more than mild irritation at the application area.   WartPEEL can cause scarring if it is applied to normal skin or if you continue applying it after the seborrheic keratosis has already been adequately treated. Do not use this on moles or any other spots your physician did not specifically recommend you treat.   WartPeel can be filled only at a Programme researcher, broadcasting/film/video. Please call the pharmacy below to fill your prescription. Once they confirm your address and payment, they will mail you the medicine. DO NOT USE THIS MEDICINE IF YOU ARE PREGNANT OR THINKING OF BECOMING PREGNANT.  [facility] Ph: [phone]   Must be dispensed in amber syringe to ensure quality of medication. PRIOR TO USING THIS MEDICATION, IT IS IMPORTANT TO INFORM YOUR PHYSICIAN IF YOU ARE PREGNANT OR THINKING OF BECOMING PREGNANT. IMPROPER USE OF THIS MEDICINE (USING IT  CAN CAUSE PERMANENT SCARRING.  **This medication was custom compounded for you based on the prescription orders of your physician.  How to use this medication: Apply medication at bedtime. Apply very small amount of medication to a flat plastic applicator. Use the applicator to  apply a thin layer directly onto the raised seborrheic keratosis ("Wisdom Spot"). Use care to avoid applying the medicine to healthy skin. The medicine will break down healthy skin as well as the warts.  Cover the wart with the tape provided.  Put the cap back tightly on the syringe. Wash hands after applying the medicine. NEVER put the WartPEEL in the mouth, nose or eyes. Remove the tape in the morning and wash the area thoroughly.  In case of accidental ingestion or contact with the eye, nose or mouth, contact [place] or the local poison control. Do not use this medicine for more than 4 days to the affected area. You may only need to apply it for 1-2 nights if the spot is very thin.  If you develop any pain, stop using the medicine immediately and allow the area to heal, keeping it moist with vaseline while healing.  Once the area is completely healed, if there is still some seborrheic keratosis left, you can repeat treatment, being careful not to use the medicine for too long (longer than 4 days or after developing any pain).  What to expect: During the first few days of application the seborrheic keratosis may swell and become white. This will slough off with continued applications. Normal Dosage: The medication is applied once daily for a time determined by your physician. Storage Requirements: Store this medication at room temperature. Keep out of reach of children. PROTECT FROM LIGHT. Expiration Date: The medication is good for four months from the date made. Do not keep  outdated medication. Side effects: Rash and irritation, if medication is applied to healthy/normal skin. Risk of scarring if the medication is applied for too long to healthy skin. Cautions and Warnings: Only apply the medication to the seborrheic keratoses. Do not apply to good skin. Keep away from children. Do not use on nose, eyes or the mouth.     Wound Care Instructions  Cleanse wound gently with soap  and water once a day then pat dry with clean gauze. Apply a thing coat of Petrolatum (petroleum jelly, "Vaseline") over the wound (unless you have an allergy to this). We recommend that you use a new, sterile tube of Vaseline. Do not pick or remove scabs. Do not remove the yellow or white "healing tissue" from the base of the wound.  Cover the wound with fresh, clean, nonstick gauze and secure with paper tape. You may use Band-Aids in place of gauze and tape if the would is small enough, but would recommend trimming much of the tape off as there is often too much. Sometimes Band-Aids can irritate the skin.  You should call the office for your biopsy report after 1 week if you have not already been contacted.  If you experience any problems, such as abnormal amounts of bleeding, swelling, significant bruising, significant pain, or evidence of infection, please call the office immediately.  FOR ADULT SURGERY PATIENTS: If you need something for pain relief you may take 1 extra strength Tylenol (acetaminophen) AND 2 Ibuprofen (200mg  each) together every 4 hours as needed for pain. (do not take these if you are allergic to them or if you have a reason you should not take them.) Typically, you may only need pain medication for 1 to 3 days.     If you have any questions or concerns for your doctor, please call our main line at [phone] and press option 4 to reach your doctor's medical assistant. If no one answers, please leave a voicemail as directed and we will return your call as soon as possible. Messages left after 4 pm will be answered the following business day.   You may also send [place] a message via [place]. We typically respond to MyChart messages within 1-2 business days.  For prescription refills, please ask your pharmacy to contact our office. Our fax number is [phone].  If you have an urgent issue when the clinic is closed that cannot wait until the next business day, you can page your  doctor at the number below.    Please note that while we do our best to be available for urgent issues outside of office hours, we are not available 24/7.   If you have an urgent issue and are unable to reach [place], you may choose to seek medical care at your doctor's office, retail clinic, urgent care center, or emergency room.  If you have a medical emergency, please immediately call 911 or go to the emergency department.  Pager Numbers  - Dr. Nehemiah Massed: [phone]  - Dr. Laurence Ferrari: [phone]  - Dr. Nicole Kindred: [phone]  In the event of inclement weather, please call our main line at [phone] for an update on the status of any delays or closures.  Dermatology Medication Tips: Please keep the boxes that topical medications come in in order to help keep track of the instructions about where and how to use these. Pharmacies typically print the medication instructions only on the boxes and not directly on the medication tubes.   If your medication is too  expensive, please contact our office at [phone] option 4 or send [place] a message through [place].   We are unable to tell what your co-pay for medications will be in advance as this is different depending on your insurance coverage. However, we may be able to find a substitute medication at lower cost or fill out paperwork to get insurance to cover a needed medication.   If a prior authorization is required to get your medication covered by your insurance company, please allow [place] 1-2 business days to complete this process.  Drug prices often vary depending on where the prescription is filled and some pharmacies may offer cheaper prices.  The website www.goodrx.com contains coupons for medications through different pharmacies. The prices here do not account for what the cost may be with help from insurance (it may be cheaper with your insurance), but the website can give you the price if you did not use any insurance.  - You can print  the associated coupon and take it with your prescription to the pharmacy.  - You may also stop by our office during regular business hours and pick up a GoodRx coupon card.  - If you need your prescription sent electronically to a different pharmacy, notify our office through [facility] or by phone at [phone] option 4.  Instructions for [facility] Medications  One or more of your medications was sent to the [facility] mail order compounding pharmacy. You will receive an email from them and can purchase the medicine through that link. It will then be mailed to your home at the address you confirmed. If for any reason you do not receive an email from them, please check your spam folder. If you still do not find the email, please let us know. [facility] phone number is [phone].

## 2021-08-20 NOTE — Progress Notes (Addendum)
Follow-Up Visit   Subjective  Mackenzie Bailey is a 55 y.o. female who presents for the following: Annual Exam (Hx of severely dysplastic nevus on the L upper back that was excisied). The patient presents for Total-Body Skin Exam (TBSE) for skin cancer screening and mole check.  She has a spot at her face that is bothersome.   The following portions of the chart were reviewed this encounter and updated as appropriate:   Tobacco  Allergies  Meds  Problems  Med Hx  Surg Hx  Fam Hx      Review of Systems:  No other skin or systemic complaints except as noted in HPI or Assessment and Plan.  Objective  Well appearing patient in no apparent distress; mood and affect are within normal limits.  A full examination was performed including scalp, head, eyes, ears, nose, lips, neck, chest, axillae, abdomen, back, buttocks, bilateral upper extremities, bilateral lower extremities, hands, feet, fingers, toes, fingernails, and toenails. All findings within normal limits unless otherwise noted below.  B/L forearms Hyperpigmented macules coalescing to papules of the forearms.    L post shoulder 0.4 cm medium to dark brown papule.      L mid back 0.9 cm slightly irregular med to dark brown thin oval papule.     Left Upper Back Smooth pink thin plaque  Right Parotid Area Thin erythematous waxy brown plaque   Assessment & Plan  Rash and other nonspecific skin eruption B/L forearms  Ddx includes lichen planus pigmentosus vs erythema dyschromicum perstans vs other -   Benign-appearing.  Discussed topical hydroquinone but we would need check with PCP to see what they have prescribed her to make sure it's not the same thing/she doesn't use for more than 3 months. Patient declines treatment at this time and states it's not bothersome. Patient deferred biopsy.    Neoplasm of uncertain behavior of skin (2) L post shoulder  Epidermal / dermal shaving  Lesion diameter  (cm):  0.4 Informed consent: discussed and consent obtained   Timeout: patient name, date of birth, surgical site, and procedure verified   Procedure prep:  Patient was prepped and draped in usual sterile fashion Prep type:  Isopropyl alcohol Anesthesia: the lesion was anesthetized in a standard fashion   Anesthetic:  1% lidocaine w/ epinephrine 1-100,000 buffered w/ 8.4% NaHCO3 Instrument used: flexible razor blade   Hemostasis achieved with: pressure, aluminum chloride and electrodesiccation   Outcome: patient tolerated procedure well   Post-procedure details: sterile dressing applied and wound care instructions given   Dressing type: bandage and petrolatum    Specimen 1 - Surgical pathology Differential Diagnosis: D48.5 r/o dysplastic nevus Check Margins: No  L mid back  Epidermal / dermal shaving  Lesion diameter (cm):  0.9 Informed consent: discussed and consent obtained   Timeout: patient name, date of birth, surgical site, and procedure verified   Procedure prep:  Patient was prepped and draped in usual sterile fashion Prep type:  Isopropyl alcohol Anesthesia: the lesion was anesthetized in a standard fashion   Anesthetic:  1% lidocaine w/ epinephrine 1-100,000 buffered w/ 8.4% NaHCO3 Instrument used: flexible razor blade   Hemostasis achieved with: pressure, aluminum chloride and electrodesiccation   Outcome: patient tolerated procedure well   Post-procedure details: sterile dressing applied and wound care instructions given   Dressing type: bandage and petrolatum    Specimen 2 - Surgical pathology Differential Diagnosis: D48.5 r/o dysplastic nevus  Check Margins: No  Scar conditions and fibrosis of  skin Left Upper Back  Hypertrophic scar  At site of severe dysplastic nevus excision - occasionally itchy and bothersome per pt  Discussed ILK injections but patient declines at this time.   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 Symptomatic and patient desires treatment.   Discussed option of cryotherapy. Pt prefers topical therapy.   Discussed option of WartPeel apply thin layer just to seborrheic keratoses at night for up to 4 nights, stopping early if she develops any pain or more than mild irritation. Reviewed risk of irritation if any gets on surrounding skin (hold medication for a few days and restart) and risk of scarring if she uses the medication for too long and it is applied on normal skin at the base of the seborrheic keratosis.   WartPEEL is a special compounded prescription medicine (5-fluorouracil/salicylic acid) used to treat warts and sometimes used to treat seborrheic keratoses. It is not paid for by insurance. It should be applied to the seborrheic keratosis once a day at night and covered with a bandage. Care should be taken to avoid applying it to the normal skin in order to avoid irritation. It should not be used by pregnant women. It should not be used for more than 4 days. It should be stopped immediately if you develop pain or more than mild irritation. It can cause scarring if it is applied to normal skin or if you continue applying it after the seborrheic keratosis has already been treated.    Lentigines - Scattered tan macules - Due to sun exposure - Benign-appearing, observe - Recommend daily broad spectrum sunscreen SPF 30+ to sun-exposed areas, reapply every 2 hours as needed. - Call for any changes  Seborrheic Keratoses - Stuck-on, waxy, tan-brown papules and/or plaques  - Benign-appearing - Discussed benign etiology and prognosis. - Observe - Call for any changes  Melanocytic Nevi - Tan-brown and/or pink-flesh-colored symmetric macules and papules - Benign appearing on exam today - Observation - Call clinic for new or changing moles - Recommend daily use of broad spectrum spf 30+ sunscreen to sun-exposed areas.   Hemangiomas -  Red papules - Discussed benign nature - Observe - Call for any changes  Actinic Damage - Chronic condition, secondary to cumulative UV/sun exposure - diffuse scaly erythematous macules with underlying dyspigmentation - Recommend daily broad spectrum sunscreen SPF 30+ to sun-exposed areas, reapply every 2 hours as needed.  - Staying in the shade or wearing long sleeves, sun glasses (UVA+UVB protection) and wide brim hats (4-inch brim around the entire circumference of the hat) are also recommended for sun protection.  - Call for new or changing lesions.  History of Dysplastic Nevus - L upper back (severe) - No evidence of recurrence today - Recommend regular full body skin exams - Recommend daily broad spectrum sunscreen SPF 30+ to sun-exposed areas, reapply every 2 hours as needed.  - Call if any new or changing lesions are noted between office visits  Skin cancer screening performed today.  Return in about 1 year (around 08/20/2022) for TBSE.  Luther Redo, CMA, am acting as scribe for Forest Gleason, MD .  Documentation: I have reviewed the above documentation for accuracy and completeness, and I agree with the above.  Forest Gleason, MD

## 2021-08-25 ENCOUNTER — Telehealth: Payer: Self-pay

## 2021-08-25 NOTE — Telephone Encounter (Signed)
Patient has been made aware of BX results and scheduled for surgery.

## 2021-08-31 ENCOUNTER — Telehealth: Payer: Self-pay

## 2021-08-31 NOTE — Telephone Encounter (Signed)
Patient called back and I informed her prescription and she was already aware/hd

## 2021-08-31 NOTE — Telephone Encounter (Signed)
Left message on voicemail to return my call. I wanted to inform her that the topical treatment for Sk's was faxed to a mail order pharmacy called [facility]. She should have received a phone call from them, but if not their contact number is [phone]. WartPEEL is a special compounded prescription medicine (5-fluorouracil/salicylic acid) used to treat warts and sometimes that can be used to treat seborrheic keratoses. It is not paid for by insurance. It should be applied to the raised seborrheic keratosis once a day at night and covered with a bandage. Care should be taken to avoid applying it to the normal skin in order to avoid irritation. It should not be used by pregnant women. It should not be used for more than 4 days. It should be stopped immediately if you develop pain or more than mild irritation. It can cause scarring if it is applied to normal skin or if you continue applying it after the seborrheic keratosis has already been adequately treated.

## 2021-09-14 ENCOUNTER — Telehealth: Payer: Self-pay

## 2021-09-14 NOTE — Telephone Encounter (Signed)
ERROR

## 2021-09-16 ENCOUNTER — Other Ambulatory Visit: Payer: Self-pay

## 2021-09-16 ENCOUNTER — Ambulatory Visit: Payer: Commercial Managed Care - PPO | Admitting: Dermatology

## 2021-09-16 DIAGNOSIS — D225 Melanocytic nevi of trunk: Secondary | ICD-10-CM

## 2021-09-16 DIAGNOSIS — D485 Neoplasm of uncertain behavior of skin: Secondary | ICD-10-CM

## 2021-09-16 MED ORDER — MUPIROCIN 2 % EX OINT: 1.0000 "application " | TOPICAL_OINTMENT | Freq: Every day | CUTANEOUS | 0 refills | Status: DC

## 2021-09-16 NOTE — Progress Notes (Signed)
Follow-Up Visit   Subjective  Mackenzie Bailey is a 55 y.o. 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 ).   The following portions of the chart were reviewed this encounter and updated as appropriate:   Tobacco  Allergies  Meds  Problems  Med Hx  Surg Hx  Fam Hx      Review of Systems:  No other skin or systemic complaints except as noted in HPI or Assessment and Plan.  Objective  Well appearing patient in no apparent distress; mood and affect are within normal limits.  A focused examination was performed including back. Relevant physical exam findings are noted in the Assessment and Plan.  Left Mid Back Healing biopsy site  [id number]   Assessment & Plan  Neoplasm of uncertain behavior of skin Left Mid Back  Skin excision  Lesion length (cm):  1.1 Total excision diameter (cm):  2.1 Informed consent: discussed and consent obtained   Timeout: patient name, date of birth, surgical site, and procedure verified   Procedure prep:  Patient was prepped and draped in usual sterile fashion Prep type:  Chlorhexidine Anesthesia: the lesion was anesthetized in a standard fashion   Anesthetic:  1% lidocaine w/ epinephrine 1-100,000 buffered w/ 8.4% NaHCO3 (8cc lido w/epi, 9cc 0.25% bupivicaine) Instrument used: #15 blade   Hemostasis achieved with: suture, pressure and electrodesiccation   Outcome: patient tolerated procedure well with no complications   Post-procedure details: wound care instructions given   Additional details:  Mupirocin and a pressure dressing applied  Skin repair Complexity:  Complex Final length (cm):  4.6 Informed consent: discussed and consent obtained   Timeout: patient name, date of birth, surgical site, and procedure verified   Procedure prep:  Patient was prepped and draped in usual sterile fashion Prep type:   Chlorhexidine Anesthesia: the lesion was anesthetized in a standard fashion   Anesthetic:  1% lidocaine w/ epinephrine 1-100,000 local infiltration Reason for type of repair: reduce tension to allow closure, reduce the risk of dehiscence, infection, and necrosis, reduce subcutaneous dead space and avoid a hematoma, allow closure of the large defect, allow side-to-side closure without requiring a flap or graft and enhance both functionality and cosmetic results   Undermining: area extensively undermined   Subcutaneous layers (deep stitches):  Suture size:  3-0 and 4-0 Suture type: Vicryl (polyglactin 910) and PDS (polydioxanone)   Stitches:  Buried vertical mattress Fine/surface layer approximation (top stitches):  Suture size:  4-0 Suture type: Prolene (polypropylene)   Suture removal (days):  8 Hemostasis achieved with: pressure and electrodesiccation Outcome: patient tolerated procedure well with no complications   Post-procedure details: wound care instructions given   Additional details:  Extensive undermining greater than the maximum width of the defect along at least one entire edge of the defect was performed Maximum width of defect perpendicular to the line of the closure 1.9 cm Width of undermining done 2.2 cm  Mupirocin and a pressure bandage applied   mupirocin ointment (BACTROBAN) 2 % Apply 1 application topically daily. 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  Graciella Belton, RMA, am acting as scribe for Forest Gleason, MD .  Documentation: I  have reviewed the above documentation for accuracy and completeness, and I agree with the above.  Forest Gleason, MD

## 2021-09-16 NOTE — Patient Instructions (Signed)

## 2021-09-17 ENCOUNTER — Telehealth: Payer: Self-pay

## 2021-09-17 NOTE — Telephone Encounter (Signed)
Patient doing well after yesterday's surgery.Mackenzie Bailey

## 2021-09-21 ENCOUNTER — Encounter: Payer: Self-pay | Admitting: Dermatology

## 2021-09-24 ENCOUNTER — Ambulatory Visit (INDEPENDENT_AMBULATORY_CARE_PROVIDER_SITE_OTHER): Payer: Commercial Managed Care - PPO | Admitting: Dermatology

## 2021-09-24 ENCOUNTER — Other Ambulatory Visit: Payer: Self-pay

## 2021-09-24 ENCOUNTER — Telehealth: Payer: Self-pay

## 2021-09-24 DIAGNOSIS — Z4802 Encounter for removal of sutures: Secondary | ICD-10-CM

## 2021-09-24 NOTE — Telephone Encounter (Signed)
Spoke with patient she has an appointment coming up 01/03. She can get her second shingles vaccine then.

## 2021-09-24 NOTE — Patient Instructions (Signed)

## 2021-09-24 NOTE — Progress Notes (Signed)
   Follow-Up Visit   Subjective  Mackenzie Bailey is a 55 y.o. female who presents for the following: Follow-up (Patient here today for suture removal. ).   The following portions of the chart were reviewed this encounter and updated as appropriate:   Tobacco  Allergies  Meds  Problems  Med Hx  Surg Hx  Fam Hx      Review of Systems:  No other skin or systemic complaints except as noted in HPI or Assessment and Plan.  Objective  Well appearing patient in no apparent distress; mood and affect are within normal limits.  A focused examination was performed including mid back. Relevant physical exam findings are noted in the Assessment and Plan.    Assessment & Plan   Encounter for Removal of Sutures - Incision site at the left mid back is clean, dry and intact - Wound cleansed, sutures removed, wound cleansed and steri strips applied.  - Discussed pathology results showing margins free  - Patient advised to keep steri-strips dry until they fall off. - Scars remodel for a full year. - Once steri-strips fall off, patient can apply over-the-counter silicone scar cream each night to help with scar remodeling if desired. - Patient advised to call with any concerns or if they notice any new or changing lesions.  Return in about 8 months (around 05/25/2022) for TBSE.  Graciella Belton, RMA, am acting as scribe for Forest Gleason, MD .  Documentation: I have reviewed the above documentation for accuracy and completeness, and I agree with the above.  Forest Gleason, MD

## 2021-09-24 NOTE — Telephone Encounter (Signed)
Copied from [place] [id number]. Topic: General - Other >> Sep 24, 2021  2:18 PM Holley Dexter N wrote: Reason for CRM: Pt called in stating she wasn't sure when it was time to get her next shingles shot and requested if a nurse could give her a call back to let her know when and to possibly get that scheduled, please advise.

## 2021-10-06 ENCOUNTER — Encounter: Payer: Self-pay | Admitting: Dermatology

## 2021-10-15 ENCOUNTER — Other Ambulatory Visit: Payer: Self-pay | Admitting: Family Medicine

## 2021-10-15 DIAGNOSIS — F419 Anxiety disorder, unspecified: Secondary | ICD-10-CM

## 2021-10-15 DIAGNOSIS — I10 Essential (primary) hypertension: Secondary | ICD-10-CM

## 2021-10-15 NOTE — Telephone Encounter (Signed)
Requested medication (s) are due for refill today: Yes  Requested medication (s) are on the active medication list: Yes  Last refill:  04/07/21  Future visit scheduled: Yes  Notes to clinic:  Unable to refill per protocol due to diagnosis code needed.      Requested Prescriptions  Pending Prescriptions Disp Refills   amLODipine (NORVASC) 10 MG tablet [Pharmacy Med Name: AMLODIPINE BESYLATE 10 MG TAB] 90 tablet 1    Sig: TAKE 1 TABLET BY MOUTH EVERY DAY     Cardiovascular:  Calcium Channel Blockers Failed - 10/15/2021  9:08 AM      Failed - Valid encounter within last 6 months    Recent Outpatient Visits           6 months ago Encounter for annual health examination   [facility], Dionne Bucy, MD   9 months ago [place], Dionne Bucy, MD   1 year ago [place], Dionne Bucy, MD   1 year ago [place], [place], [place]   1 year ago Suspected COVID-19 virus infection   [facility] [place], Wendee Beavers, [place]       Future Appointments             In 5 days [place], Dionne Bucy, MD [facility], [facility]            Passed - Last BP in normal range    BP Readings from Last 1 Encounters:  06/15/21 135/81           sertraline (ZOLOFT) 100 MG tablet [Pharmacy Med Name: SERTRALINE HCL 100 MG TABLET] 135 tablet 1    Sig: TAKE 1.5 TABLETS (150MG  TOTAL) BY MOUTH DAILY     Psychiatry:  Antidepressants - SSRI Failed - 10/15/2021  9:08 AM      Failed - Valid encounter within last 6 months    Recent Outpatient Visits           6 months ago Encounter for annual health examination   [facility] [place], Dionne Bucy, MD   9 months ago [place], Dionne Bucy, MD   1 year ago [place], Dionne Bucy, MD   1 year ago [place], [place], [place]   1 year ago Suspected COVID-19 virus infection   [place], Wendee Beavers, PA-C       Future Appointments             In 5 days [place], Dionne Bucy, MD [facility], [facility]             buPROPion (WELLBUTRIN XL) 150 MG 24 hr tablet [Pharmacy Med Name: BUPROPION HCL XL 150 MG TABLET] 90 tablet 1    Sig: TAKE 1 TABLET BY [place]     Psychiatry: Antidepressants - bupropion Failed - 10/15/2021  9:08 AM      Failed - Valid encounter within last 6 months    Recent Outpatient Visits           6 months ago Encounter for annual health examination   [place], Dionne Bucy, MD   9 months ago Polyarthralgia   [facility], Dionne Bucy, MD   1 year ago Myalgia   [facility], Dionne Bucy, MD  1 year ago [place], [place], [place]   1 year ago Suspected COVID-19 virus infection   [place], Wendee Beavers, [place]       Future Appointments             In 5 days [place], Dionne Bucy, MD [facility], [place] BP in normal range    BP Readings from Last 1 Encounters:  06/15/21 135/81

## 2021-10-20 ENCOUNTER — Encounter: Payer: Self-pay | Admitting: Family Medicine

## 2021-10-20 ENCOUNTER — Ambulatory Visit: Payer: Commercial Managed Care - PPO | Admitting: Family Medicine

## 2021-10-20 ENCOUNTER — Other Ambulatory Visit: Payer: Self-pay

## 2021-10-20 VITALS — BP 142/90 | HR 81 | Temp 97.9°F | Resp 16 | Ht 64.0 in | Wt 201.6 lb

## 2021-10-20 DIAGNOSIS — I10 Essential (primary) hypertension: Secondary | ICD-10-CM

## 2021-10-20 DIAGNOSIS — M62838 Other muscle spasm: Secondary | ICD-10-CM

## 2021-10-20 DIAGNOSIS — E559 Vitamin D deficiency, unspecified: Secondary | ICD-10-CM

## 2021-10-20 DIAGNOSIS — E669 Obesity, unspecified: Secondary | ICD-10-CM | POA: Diagnosis not present

## 2021-10-20 DIAGNOSIS — Z23 Encounter for immunization: Secondary | ICD-10-CM

## 2021-10-20 DIAGNOSIS — F419 Anxiety disorder, unspecified: Secondary | ICD-10-CM

## 2021-10-20 DIAGNOSIS — Z6834 Body mass index (BMI) 34.0-34.9, adult: Secondary | ICD-10-CM

## 2021-10-20 MED ORDER — CYCLOBENZAPRINE HCL 5 MG PO TABS: 5.0000 mg | ORAL_TABLET | Freq: Three times a day (TID) | ORAL | 1 refills | Status: DC | PRN

## 2021-10-20 MED ORDER — ALPRAZOLAM 1 MG PO TABS: 1.0000 mg | ORAL_TABLET | Freq: Every day | ORAL | 0 refills | Status: DC | PRN

## 2021-10-20 NOTE — Assessment & Plan Note (Signed)
Continue high-dose weekly supplement Recheck vitamin D level today

## 2021-10-20 NOTE — Assessment & Plan Note (Signed)
Remains elevated here, but was well controlled at neurology appt She did miss some doses of her amlodipine this weekend and had a high salt meal last night, which may contribute She does not want to add a second antihypertensive today, so we will continue amlodipine 10 mg daily Advised on DASH diet, exercise, need to take medication regularly Resume checking home blood pressures to monitor Discussed goal of less than 140/90 Follow-up in 1 month for recheck

## 2021-10-20 NOTE — Assessment & Plan Note (Signed)
Discussed importance of healthy weight management Discussed diet and exercise  

## 2021-10-20 NOTE — Assessment & Plan Note (Signed)
Chronic and well-controlled Continue Wellbutrin and Zoloft at current doses Using Xanax very infrequently Have discussed risks of long-term use Encourage therapy

## 2021-10-20 NOTE — Progress Notes (Signed)
Established patient visit   Patient: Mackenzie Bailey   DOB: 19-Oct-1965   56 y.o. Female  MRN: [id number] Visit Date: 10/20/2021  Today's healthcare provider: Lavon Paganini, MD   Chief Complaint  Patient presents with   Hypertension   Anxiety   I,Sulibeya S Dimas,acting as a scribe for Lavon Paganini, MD.,have documented all relevant documentation on the behalf of Lavon Paganini, MD,as directed by  Lavon Paganini, MD while in the presence of Lavon Paganini, MD.  Subjective    HPI  Hypertension, follow-up  BP Readings from Last 3 Encounters:  10/20/21 (!) 142/90  06/15/21 135/81  04/07/21 (!) 149/82   Wt Readings from Last 3 Encounters:  10/20/21 201 lb 9.6 oz (91.4 kg)  06/15/21 196 lb (88.9 kg)  04/07/21 202 lb 4.8 oz (91.8 kg)     She was last seen for hypertension 6 months ago.  BP at that visit was 149/82. Management since that visit includes no changes.  She reports excellent compliance with treatment. She is not having side effects.  She is following a Regular diet. She is exercising. She does not smoke.  Use of agents associated with hypertension: none.   Outside blood pressures are stable. Symptoms: No chest pain No chest pressure  No palpitations No syncope  No dyspnea No orthopnea  No paroxysmal nocturnal dyspnea No lower extremity edema   Pertinent labs: Lab Results  Component Value Date   CHOL 192 04/07/2021   HDL 74 04/07/2021   LDLCALC 102 (H) 04/07/2021   TRIG 91 04/07/2021   CHOLHDL 3.1 03/27/2018   Lab Results  Component Value Date   NA 141 04/07/2021   K 3.8 04/07/2021   CREATININE 0.52 (L) 04/07/2021   EGFR 111 04/07/2021   GLUCOSE 90 04/07/2021   TSH 1.560 04/07/2021     The 10-year ASCVD risk score (Arnett DK, et al., 2019) is: 2.1%   --------------------------------------------------------------------------------------------------- Anxiety, Follow-up  She was last seen for anxiety 6 months  ago. Changes made at last visit include no changes.   She reports excellent compliance with treatment. Patient reports taking Xanax once a week She reports excellent tolerance of treatment. She is not having side effects.   She feels her anxiety is mild and Unchanged since last visit.  Symptoms: No chest pain Yes difficulty concentrating  No dizziness No fatigue  No feelings of losing control Yes insomnia  No irritable No palpitations  No panic attacks No racing thoughts  No shortness of breath No sweating  No tremors/shakes    GAD-7 Results GAD-7 Generalized Anxiety Disorder Screening Tool 10/20/2021 02/18/2020 01/28/2020  1. Feeling Nervous, Anxious, or on Edge 0 1 2  2. Not Being Able to Stop or Control Worrying _0 3. Worrying Too Much About Different Things _1 4. Trouble Relaxing 1 0 2  5. Being So Restless it's Hard To Sit Still 0 0 0  6. Becoming Easily Annoyed or Irritable 0 1 2  7. Feeling Afraid As If Something Awful Might Happen 1 0 1  Total GAD-7 Score _2 Difficulty At Work, Home, or Getting  Along With Others? Somewhat difficult Not difficult at all Not difficult at all    PHQ-9 Scores PHQ9 SCORE ONLY 10/20/2021 04/07/2021 12/22/2020  PHQ-9 Total Score _3 ---------------------------------------------------------------------------------------------------   Medications: Outpatient Medications Prior to Visit  Medication Sig   albuterol (PROVENTIL) (2.5 MG/3ML) 0.083% nebulizer solution Take  3 mLs (2.5 mg total) by nebulization every 6 (six) hours as needed for wheezing or shortness of breath.   amLODipine (NORVASC) 10 MG tablet TAKE 1 TABLET BY MOUTH EVERY DAY   buPROPion (WELLBUTRIN XL) 150 MG 24 hr tablet TAKE 1 TABLET BY MOUTH EVERY DAY   diphenhydramine-acetaminophen (TYLENOL PM) 25-500 MG TABS tablet Take 1-2 tablets by mouth at bedtime as needed (sleep).   esomeprazole (NEXIUM) 20 MG capsule Take 20 mg by mouth daily as needed (heart burn).    ipratropium-albuterol (DUONEB) 0.5-2.5 (3) MG/3ML SOLN Take 3 mLs by nebulization every 6 (six) hours as needed.   ketoconazole (NIZORAL) 2 % shampoo APPLY TO AFFECTED AREA DAILY. LET STAY ON SKIN FOR 5 MINUTES BEFORE WASHING OFF   montelukast (SINGULAIR) 10 MG tablet Take 1 tablet (10 mg total) by mouth at bedtime.   mupirocin ointment (BACTROBAN) 2 % Apply 1 application topically daily. With dressing changes   Pseudoeph-Doxylamine-DM-APAP (NYQUIL PO) Take 1 Dose by mouth at bedtime as needed (sleep).   sertraline (ZOLOFT) 100 MG tablet TAKE 1.5 TABLETS (150MG TOTAL) BY MOUTH DAILY   traZODone (DESYREL) 50 MG tablet TAKE 1/2 TO 1 TABLET BY MOUTH AT BEDTIME AS NEEDED FOR SLEEP   Vitamin D, Ergocalciferol, (DRISDOL) 1.25 MG (50000 UNIT) CAPS capsule Take 1 capsule (50,000 Units total) by mouth every 7 (seven) days.   [DISCONTINUED] ALPRAZolam (XANAX) 1 MG tablet Take 1 tablet (1 mg total) by mouth daily as needed for anxiety.   [DISCONTINUED] propranolol (INDERAL) 10 MG tablet Take 1 tablet (10 mg total) by mouth 3 (three) times daily as needed (anxiety).   No facility-administered medications prior to visit.    Review of Systems per HPI      Objective    BP (!) 142/90 Comment: manual   Pulse 81    Temp 97.9 F (36.6 C) (Temporal)    Resp 16    Ht 5' 4" (1.626 m)    Wt 201 lb 9.6 oz (91.4 kg)    LMP 05/09/2019 (Exact Date)    SpO2 99%    BMI 34.60 kg/m  BP Readings from Last 3 Encounters:  10/20/21 (!) 142/90  06/15/21 135/81  04/07/21 (!) 149/82   Wt Readings from Last 3 Encounters:  10/20/21 201 lb 9.6 oz (91.4 kg)  06/15/21 196 lb (88.9 kg)  04/07/21 202 lb 4.8 oz (91.8 kg)      Physical Exam Vitals reviewed.  Constitutional:      General: She is not in acute distress.    Appearance: Normal appearance. She is well-developed. She is not diaphoretic.  HENT:     Head: Normocephalic and atraumatic.  Eyes:     General: No scleral icterus.    Conjunctiva/sclera: Conjunctivae  normal.  Neck:     Thyroid: No thyromegaly.  Cardiovascular:     Rate and Rhythm: Normal rate and regular rhythm.     Pulses: Normal pulses.     Heart sounds: Normal heart sounds. No murmur heard. Pulmonary:     Effort: Pulmonary effort is normal. No respiratory distress.     Breath sounds: Normal breath sounds. No wheezing, rhonchi or rales.  Musculoskeletal:     Cervical back: Neck supple.     Right lower leg: No edema.     Left lower leg: No edema.  Lymphadenopathy:     Cervical: No cervical adenopathy.  Skin:    General: Skin is warm and dry.     Findings: No rash.  Neurological:  Mental Status: She is alert and oriented to person, place, and time. Mental status is at baseline.  Psychiatric:        Mood and Affect: Mood normal.        Behavior: Behavior normal.      No results found for any visits on 10/20/21.  Assessment & Plan     Problem List Items Addressed This Visit       Cardiovascular and Mediastinum   Essential hypertension - Primary    Remains elevated here, but was well controlled at neurology appt She did miss some doses of her amlodipine this weekend and had a high salt meal last night, which may contribute She does not want to add a second antihypertensive today, so we will continue amlodipine 10 mg daily Advised on DASH diet, exercise, need to take medication regularly Resume checking home blood pressures to monitor Discussed goal of less than 140/90 Follow-up in 1 month for recheck      Relevant Orders   Basic Metabolic Panel (BMET)     Other   Anxiety    Chronic and well-controlled Continue Wellbutrin and Zoloft at current doses Using Xanax very infrequently Have discussed risks of long-term use Encourage therapy      Relevant Medications   ALPRAZolam (XANAX) 1 MG tablet   Obesity    Discussed importance of healthy weight management Discussed diet and exercise       Avitaminosis D    Continue high-dose weekly  supplement Recheck vitamin D level today      Relevant Orders   VITAMIN D 25 Hydroxy (Vit-D Deficiency, Fractures)   Other Visit Diagnoses     Need for zoster vaccination       Relevant Orders   Varicella-zoster vaccine IM (Completed)   Trapezius muscle spasm          - ok to use flexeril prn    Return in about 4 weeks (around 11/17/2021) for BP f/u.      I, Lavon Paganini, MD, have reviewed all documentation for this visit. The documentation on 10/20/21 for the exam, diagnosis, procedures, and orders are all accurate and complete.   Bacigalupo, Dionne Bucy, MD, MPH [facility]

## 2021-10-21 LAB — BASIC METABOLIC PANEL
BUN/Creatinine Ratio: 23 (ref 9–23)
BUN: 12 mg/dL (ref 6–24)
CO2: 26 mmol/L (ref 20–29)
Calcium: 9.7 mg/dL (ref 8.7–10.2)
Chloride: 102 mmol/L (ref 96–106)
Creatinine, Ser: 0.52 mg/dL — ABNORMAL LOW (ref 0.57–1.00)
Glucose: 94 mg/dL (ref 70–99)
Potassium: 4 mmol/L (ref 3.5–5.2)
Sodium: 142 mmol/L (ref 134–144)
eGFR: 110 mL/min/{1.73_m2} (ref >59)

## 2021-10-21 LAB — VITAMIN D 25 HYDROXY (VIT D DEFICIENCY, FRACTURES): Vit D, 25-Hydroxy: 51.6 ng/mL (ref 30.0–100.0)

## 2021-12-04 NOTE — Progress Notes (Unsigned)
Established patient visit   Patient: Mackenzie Bailey   DOB: [birth year]   56 y.o. Female  MRN: [id number] Visit Date: 12/07/2021  Today's healthcare provider: Lavon Paganini, MD   No chief complaint on file.  Subjective    HPI  Follow up for hypertension  The patient was last seen for this 1 months ago. Changes made at last visit include continue amlodipine 10 mg.   She reports {excellent/good/fair/poor:19665} compliance with treatment. She feels that condition is {improved/worse/unchanged:3041574}. She {is/is not:21021397} having side effects. ***  -----------------------------------------------------------------------------------------   Medications: Outpatient Medications Prior to Visit  Medication Sig   albuterol (PROVENTIL) (2.5 MG/3ML) 0.083% nebulizer solution Take 3 mLs (2.5 mg total) by nebulization every 6 (six) hours as needed for wheezing or shortness of breath.   ALPRAZolam (XANAX) 1 MG tablet Take 1 tablet (1 mg total) by mouth daily as needed for anxiety.   amLODipine (NORVASC) 10 MG tablet TAKE 1 TABLET BY MOUTH EVERY DAY   buPROPion (WELLBUTRIN XL) 150 MG 24 hr tablet TAKE 1 TABLET BY MOUTH EVERY DAY   cyclobenzaprine (FLEXERIL) 5 MG tablet Take 1 tablet (5 mg total) by mouth 3 (three) times daily as needed for muscle spasms.   diphenhydramine-acetaminophen (TYLENOL PM) 25-500 MG TABS tablet Take 1-2 tablets by mouth at bedtime as needed (sleep).   esomeprazole (NEXIUM) 20 MG capsule Take 20 mg by mouth daily as needed (heart burn).   ipratropium-albuterol (DUONEB) 0.5-2.5 (3) MG/3ML SOLN Take 3 mLs by nebulization every 6 (six) hours as needed.   ketoconazole (NIZORAL) 2 % shampoo APPLY TO AFFECTED AREA DAILY. LET STAY ON SKIN FOR 5 MINUTES BEFORE WASHING OFF   montelukast (SINGULAIR) 10 MG tablet Take 1 tablet (10 mg total) by mouth at bedtime.   mupirocin ointment (BACTROBAN) 2 % Apply 1 application topically daily. With dressing changes    Pseudoeph-Doxylamine-DM-APAP (NYQUIL PO) Take 1 Dose by mouth at bedtime as needed (sleep).   sertraline (ZOLOFT) 100 MG tablet TAKE 1.5 TABLETS ($RemoveBefo'150MG'brEySDfsbSA$  TOTAL) BY MOUTH DAILY   traZODone (DESYREL) 50 MG tablet TAKE 1/2 TO 1 TABLET BY MOUTH AT BEDTIME AS NEEDED FOR SLEEP   Vitamin D, Ergocalciferol, (DRISDOL) 1.25 MG (50000 UNIT) CAPS capsule Take 1 capsule (50,000 Units total) by mouth every 7 (seven) days.   No facility-administered medications prior to visit.    Review of Systems  Last metabolic panel Lab Results  Component Value Date   GLUCOSE 94 10/20/2021   NA 142 10/20/2021   K 4.0 10/20/2021   CL 102 10/20/2021   CO2 26 10/20/2021   BUN 12 10/20/2021   CREATININE 0.52 (L) 10/20/2021   EGFR 110 10/20/2021   CALCIUM 9.7 10/20/2021   PROT 6.6 04/07/2021   ALBUMIN 4.1 04/07/2021   LABGLOB 2.5 04/07/2021   AGRATIO 1.6 04/07/2021   BILITOT 0.2 04/07/2021   ALKPHOS 91 04/07/2021   AST 16 04/07/2021   ALT 18 04/07/2021   ANIONGAP 13 05/28/2020       Objective    LMP 05/09/2019 (Exact Date)  BP Readings from Last 3 Encounters:  10/20/21 (!) 142/90  06/15/21 135/81  04/07/21 (!) 149/82   Wt Readings from Last 3 Encounters:  10/20/21 201 lb 9.6 oz (91.4 kg)  06/15/21 196 lb (88.9 kg)  04/07/21 202 lb 4.8 oz (91.8 kg)      Physical Exam  ***  No results found for any visits on 12/07/21.  Assessment & Plan     ***  No follow-ups on file.      {provider attestation***:1}   Lavon Paganini, MD  [facility] [phone] (phone) [phone] (fax)  [place]

## 2021-12-07 ENCOUNTER — Ambulatory Visit: Payer: Commercial Managed Care - PPO | Admitting: Family Medicine

## 2022-04-22 ENCOUNTER — Other Ambulatory Visit: Payer: Self-pay | Admitting: Physician Assistant

## 2022-04-22 DIAGNOSIS — I10 Essential (primary) hypertension: Secondary | ICD-10-CM

## 2022-04-22 DIAGNOSIS — F419 Anxiety disorder, unspecified: Secondary | ICD-10-CM

## 2022-04-22 NOTE — Telephone Encounter (Signed)
Called to schedule her for 6 month check.  Left voice mail to call back and schedule 6 month check. Given 30 day courtesy refills on amlodipine, wellbutrin and zoloft.

## 2022-04-30 ENCOUNTER — Ambulatory Visit: Payer: Self-pay

## 2022-04-30 NOTE — Telephone Encounter (Signed)
Reason for Disposition  [1] MODERATE diarrhea (e.g., 4-6 times / day more than normal) AND [2] present > 48 hours (2 days)  Answer Assessment - Initial Assessment Questions 1. DIARRHEA SEVERITY: "How bad is the diarrhea?" "How many more stools have you had in the past 24 hours than normal?"    - NO DIARRHEA (SCALE 0)   - MILD (SCALE 1-3): Few loose or mushy BMs; increase of 1-3 stools over normal daily number of stools; mild increase in ostomy output.   -  MODERATE (SCALE 4-7): Increase of 4-6 stools daily over normal; moderate increase in ostomy output. * SEVERE (SCALE 8-10; OR 'WORST POSSIBLE'): Increase of 7 or more stools daily over normal; moderate increase in ostomy output; incontinence.     I had gallbladder removed 2 yrs ago.   I've had diarrhea since my gallbladder.   2. ONSET: "When did the diarrhea begin?"      2 yrs ago 3. BM CONSISTENCY: "How loose or watery is the diarrhea?"      Watery and loose interittently 4. VOMITING: "Are you also vomiting?" If Yes, ask: "How many times in the past 24 hours?"      No 5. ABDOMINAL PAIN: "Are you having any abdominal pain?" If Yes, ask: "What does it feel like?" (e.g., crampy, dull, intermittent, constant)      No cramping 6. ABDOMINAL PAIN SEVERITY: If present, ask: "How bad is the pain?"  (e.g., Scale 1-10; mild, moderate, or severe)   - MILD (1-3): doesn't interfere with normal activities, abdomen soft and not tender to touch    - MODERATE (4-7): interferes with normal activities or awakens from sleep, abdomen tender to touch    - SEVERE (8-10): excruciating pain, doubled over, unable to do any normal activities       Mild discomfort 7. ORAL INTAKE: If vomiting, "Have you been able to drink liquids?" "How much liquids have you had in the past 24 hours?"     I'm drinking as water as much as I can.   8. HYDRATION: "Any signs of dehydration?" (e.g., dry mouth [not just dry lips], too weak to stand, dizziness, new weight loss) "When did you  last urinate?"     No  except weak 9. EXPOSURE: "Have you traveled to a foreign country recently?" "Have you been exposed to anyone with diarrhea?" "Could you have eaten any food that was spoiled?"     No 10. ANTIBIOTIC USE: "Are you taking antibiotics now or have you taken antibiotics in the past 2 months?"       Not asked 11. OTHER SYMPTOMS: "Do you have any other symptoms?" (e.g., fever, blood in stool)       No 12. PREGNANCY: "Is there any chance you are pregnant?" "When was your last menstrual period?"       N/A  Protocols used: Diarrhea-A-AH  Chief Complaint: diarrhea for 2 yrs since having gallbladder removed. Symptoms: diarrhea after every time she eats for 2 yrs.   Uses a lot of Imodium when away from home Frequency: For 2 yrs Pertinent Negatives: Patient denies blood in stool or dizziness. Disposition: '[]'$ ED /'[]'$ Urgent Care (no appt availability in office) / '[x]'$ Appointment(In office/virtual)/ '[]'$  [place] [facility]/ '[]'$ Home Care/ '[]'$ Refused Recommended Disposition /'[]'$ [facility]/ '[]'$  Follow-up with PCP Additional Notes: Appt. Made with [name], PA-C for 05/10/2022 at 1:00 at pt's request.

## 2022-04-30 NOTE — Telephone Encounter (Signed)
Summary: diarrhea   Patient experiencing  ongoing diarrhea.     Called pt - LMOM to return call.

## 2022-05-06 ENCOUNTER — Other Ambulatory Visit: Payer: Self-pay | Admitting: Family Medicine

## 2022-05-06 DIAGNOSIS — I10 Essential (primary) hypertension: Secondary | ICD-10-CM

## 2022-05-07 NOTE — Progress Notes (Unsigned)
I,[name],acting as a Education administrator for [employer], PA-C.,have documented all relevant documentation on the behalf of Mackenzie Speak, PA-C,as directed by  [employer], PA-C while in the presence of [employer], PA-C.   Established patient visit   Patient: Mackenzie Bailey   DOB: [birth year]   56 y.o. Female  MRN: [id number] Visit Date: 05/10/2022  Today's healthcare provider: [name], PA-C   Chief Complaint  Patient presents with   Diarrhea   Subjective    Patient presents for diarrhea / 4-6  times per day/loose and watery/ x past 2-3 months. Onset since gallbladder remover 2 years ago/11-23-19 and gotten worse over past 6 months.  Since making appt she has decreased  her Sertraline to 150 mg daily and problem has gotten better since, but not certain it is related.   Requested to see GI Did not notice any weight loss. Reports being "very tired." Diarrhea is not associated with food Reports having nausea.  Medications: Outpatient Medications Prior to Visit  Medication Sig   albuterol (PROVENTIL) (2.5 MG/3ML) 0.083% nebulizer solution Take 3 mLs (2.5 mg total) by nebulization every 6 (six) hours as needed for wheezing or shortness of breath.   ALPRAZolam (XANAX) 1 MG tablet Take 1 tablet (1 mg total) by mouth daily as needed for anxiety.   amLODipine (NORVASC) 10 MG tablet TAKE 1 TABLET BY MOUTH EVERY DAY   cyclobenzaprine (FLEXERIL) 5 MG tablet Take 1 tablet (5 mg total) by mouth 3 (three) times daily as needed for muscle spasms.   diphenhydramine-acetaminophen (TYLENOL PM) 25-500 MG TABS tablet Take 1-2 tablets by mouth at bedtime as needed (sleep).   esomeprazole (NEXIUM) 20 MG capsule Take 20 mg by mouth daily as needed (heart burn).   ipratropium-albuterol (DUONEB) 0.5-2.5 (3) MG/3ML SOLN Take 3 mLs by nebulization every 6 (six) hours as needed.   montelukast (SINGULAIR) 10 MG tablet Take 1 tablet (10 mg total) by mouth at bedtime.    Pseudoeph-Doxylamine-DM-APAP (NYQUIL PO) Take 1 Dose by mouth at bedtime as needed (sleep).   sertraline (ZOLOFT) 100 MG tablet TAKE 1.5 TABLETS (150 MG TOTAL) BY MOUTH DAILY   traZODone (DESYREL) 50 MG tablet TAKE 1/2 TO 1 TABLET BY MOUTH AT BEDTIME AS NEEDED FOR SLEEP   buPROPion (WELLBUTRIN XL) 150 MG 24 hr tablet TAKE 1 TABLET BY MOUTH EVERY DAY (Patient not taking: Reported on 05/10/2022)   ketoconazole (NIZORAL) 2 % shampoo APPLY TO AFFECTED AREA DAILY. LET STAY ON SKIN FOR 5 MINUTES BEFORE WASHING OFF (Patient not taking: Reported on 05/10/2022)   mupirocin ointment (BACTROBAN) 2 % Apply 1 application topically daily. With dressing changes (Patient not taking: Reported on 05/10/2022)   Vitamin D, Ergocalciferol, (DRISDOL) 1.25 MG (50000 UNIT) CAPS capsule Take 1 capsule (50,000 Units total) by mouth every 7 (seven) days. (Patient not taking: Reported on 05/10/2022)   No facility-administered medications prior to visit.    Review of Systems  Constitutional:  Positive for fatigue. Negative for chills and fever.  Respiratory: Negative.    Cardiovascular: Negative.   Gastrointestinal:  Positive for abdominal distention, abdominal pain (ache), diarrhea, nausea and vomiting. Negative for blood in stool.  Genitourinary: Negative.  Negative for hematuria.       Objective    BP 138/90 (BP Location: Left Arm, Patient Position: Sitting, Cuff Size: Normal)   Pulse 84   Temp 97.6 F (36.4 C) (Oral)   Resp 16   Wt 199 lb 11.2 oz (90.6 kg)  LMP 05/09/2019 (Exact Date)   SpO2 97%   BMI 34.28 kg/m    Physical Exam Vitals reviewed.  Constitutional:      General: She is not in acute distress.    Appearance: Normal appearance. She is well-developed. She is not diaphoretic.  HENT:     Head: Normocephalic and atraumatic.  Eyes:     General: No scleral icterus.    Conjunctiva/sclera: Conjunctivae normal.  Neck:     Thyroid: No thyromegaly.  Cardiovascular:     Rate and Rhythm: Normal rate  and regular rhythm.     Pulses: Normal pulses.     Heart sounds: Normal heart sounds. No murmur heard. Pulmonary:     Effort: Pulmonary effort is normal. No respiratory distress.     Breath sounds: Normal breath sounds. No wheezing, rhonchi or rales.  Musculoskeletal:     Cervical back: Neck supple.     Right lower leg: No edema.     Left lower leg: No edema.  Lymphadenopathy:     Cervical: No cervical adenopathy.  Skin:    General: Skin is warm and dry.     Findings: No rash.  Neurological:     Mental Status: She is alert and oriented to person, place, and time. Mental status is at baseline.  Psychiatric:        Mood and Affect: Mood normal.        Behavior: Behavior normal.       Assessment & Plan     1. Chronic diarrhea x 2 years 2. Nausea Vitals are normal. Had a cholecystectomy 2 years ago Has been having GI problems since then Worsening of diarrhea/ up to 4-6 times x past 2 mo Could it be due to side effects of taking sertraline? She has been taking sertraline for 2-3 years without any adverse reaction Per pt, her last colonoscopy was done  5 years ago / on 02/2018, she needs to repeat it this year. Because of "polyps."  Could it be due to IBS, malabsorption, GERD,  Or obgyn problems/menopause-managed by Dr. Jacinto Reap. Prefers not to see Obgyn - Ambulatory referral to Gastroenterology - CBC with Differential/Platelet - Comprehensive metabolic panel  Had abdomina hysterectomy, vaginal hysterectomy with salpingectomy, cystocele repair/pubovaginal sling BMP on 10/20/21 stable  After visit, Pt reports that she had a recent BM with rectal bleeding.  Will change a referral to GI from routine to urgent.  Fu in a mo    The patient was advised to call back or seek an in-person evaluation if the symptoms worsen or if the condition fails to improve as anticipated.  I discussed the assessment and treatment plan with the patient. The patient was provided an opportunity to ask  questions and all were answered. The patient agreed with the plan and demonstrated an understanding of the instructions.  The entirety of the information documented in the History of Present Illness, Review of Systems and Physical Exam were personally obtained by me. Portions of this information were initially documented by the CMA and reviewed by me for thoroughness and accuracy.  Portions of this note were created using dictation software and may contain typographical errors.     Mackenzie Speak, PA-C  [facility] [phone] (phone) [phone] (fax)  [place]

## 2022-05-10 ENCOUNTER — Encounter: Payer: Self-pay | Admitting: Physician Assistant

## 2022-05-10 ENCOUNTER — Ambulatory Visit: Payer: Commercial Managed Care - PPO | Admitting: Physician Assistant

## 2022-05-10 VITALS — BP 138/90 | HR 84 | Temp 97.6°F | Resp 16 | Wt 199.7 lb

## 2022-05-10 DIAGNOSIS — R110 Nausea: Secondary | ICD-10-CM | POA: Diagnosis not present

## 2022-05-10 DIAGNOSIS — K529 Noninfective gastroenteritis and colitis, unspecified: Secondary | ICD-10-CM

## 2022-05-10 DIAGNOSIS — K625 Hemorrhage of anus and rectum: Secondary | ICD-10-CM

## 2022-05-12 LAB — COMPREHENSIVE METABOLIC PANEL
ALT: 20 IU/L (ref 0–32)
AST: 16 IU/L (ref 0–40)
Albumin/Globulin Ratio: 1.6 (ref 1.2–2.2)
Albumin: 4.2 g/dL (ref 3.8–4.9)
Alkaline Phosphatase: 100 IU/L (ref 44–121)
BUN/Creatinine Ratio: 24 — ABNORMAL HIGH (ref 9–23)
BUN: 14 mg/dL (ref 6–24)
Bilirubin Total: 0.3 mg/dL (ref 0.0–1.2)
CO2: 24 mmol/L (ref 20–29)
Calcium: 9.6 mg/dL (ref 8.7–10.2)
Chloride: 102 mmol/L (ref 96–106)
Creatinine, Ser: 0.59 mg/dL (ref 0.57–1.00)
Globulin, Total: 2.6 g/dL (ref 1.5–4.5)
Glucose: 93 mg/dL (ref 70–99)
Potassium: 3.8 mmol/L (ref 3.5–5.2)
Sodium: 143 mmol/L (ref 134–144)
Total Protein: 6.8 g/dL (ref 6.0–8.5)
eGFR: 107 mL/min/{1.73_m2} (ref >59)

## 2022-05-12 LAB — CBC WITH DIFFERENTIAL/PLATELET
Basophils Absolute: 0 10*3/uL (ref 0.0–0.2)
Basos: 1 %
EOS (ABSOLUTE): 0.1 10*3/uL (ref 0.0–0.4)
Eos: 2 %
Hematocrit: 41.7 % (ref 34.0–46.6)
Hemoglobin: 13.6 g/dL (ref 11.1–15.9)
Immature Grans (Abs): 0 10*3/uL (ref 0.0–0.1)
Immature Granulocytes: 0 %
Lymphocytes Absolute: 2.4 10*3/uL (ref 0.7–3.1)
Lymphs: 35 %
MCH: 26.1 pg — ABNORMAL LOW (ref 26.6–33.0)
MCHC: 32.6 g/dL (ref 31.5–35.7)
MCV: 80 fL (ref 79–97)
Monocytes Absolute: 0.4 10*3/uL (ref 0.1–0.9)
Monocytes: 6 %
Neutrophils Absolute: 4 10*3/uL (ref 1.4–7.0)
Neutrophils: 56 %
Platelets: 265 10*3/uL (ref 150–450)
RBC: 5.21 x10E6/uL (ref 3.77–5.28)
RDW: 15.1 % (ref 11.7–15.4)
WBC: 7 10*3/uL (ref 3.4–10.8)

## 2022-05-26 ENCOUNTER — Ambulatory Visit: Payer: Commercial Managed Care - PPO | Admitting: Dermatology

## 2022-05-26 DIAGNOSIS — L304 Erythema intertrigo: Secondary | ICD-10-CM | POA: Diagnosis not present

## 2022-05-26 DIAGNOSIS — D225 Melanocytic nevi of trunk: Secondary | ICD-10-CM | POA: Diagnosis not present

## 2022-05-26 DIAGNOSIS — L988 Other specified disorders of the skin and subcutaneous tissue: Secondary | ICD-10-CM

## 2022-05-26 DIAGNOSIS — Z86018 Personal history of other benign neoplasm: Secondary | ICD-10-CM

## 2022-05-26 DIAGNOSIS — L821 Other seborrheic keratosis: Secondary | ICD-10-CM

## 2022-05-26 DIAGNOSIS — L814 Other melanin hyperpigmentation: Secondary | ICD-10-CM

## 2022-05-26 DIAGNOSIS — Z1283 Encounter for screening for malignant neoplasm of skin: Secondary | ICD-10-CM | POA: Diagnosis not present

## 2022-05-26 DIAGNOSIS — D1800 Hemangioma unspecified site: Secondary | ICD-10-CM

## 2022-05-26 DIAGNOSIS — D2261 Melanocytic nevi of right upper limb, including shoulder: Secondary | ICD-10-CM

## 2022-05-26 DIAGNOSIS — L578 Other skin changes due to chronic exposure to nonionizing radiation: Secondary | ICD-10-CM

## 2022-05-26 DIAGNOSIS — D229 Melanocytic nevi, unspecified: Secondary | ICD-10-CM

## 2022-05-26 MED ORDER — KETOCONAZOLE 2 % EX CREA: 1.0000 | TOPICAL_CREAM | Freq: Two times a day (BID) | CUTANEOUS | 2 refills | Status: AC

## 2022-05-26 MED ORDER — HYDROCORTISONE 2.5 % EX CREA: TOPICAL_CREAM | CUTANEOUS | 2 refills | Status: DC

## 2022-05-26 MED ORDER — TRETINOIN 0.025 % EX CREA: TOPICAL_CREAM | Freq: Every day | CUTANEOUS | 2 refills | Status: DC

## 2022-05-26 MED ORDER — VALACYCLOVIR HCL 500 MG PO TABS: ORAL_TABLET | ORAL | 0 refills | Status: DC

## 2022-05-26 NOTE — Progress Notes (Signed)
Follow-Up Visit   Subjective  Mackenzie Bailey is a 56 y.o. female who presents for the following: Annual Exam (The patient presents for Total-Body Skin Exam (TBSE) for skin cancer screening and mole check.  The patient has spots, moles and lesions to be evaluated, some may be new or changing and the patient has concerns that these could be cancer. Patient with hx of dysplastic nevi. ).   The following portions of the chart were reviewed this encounter and updated as appropriate:   Tobacco  Allergies  Meds  Problems  Med Hx  Surg Hx  Fam Hx      Review of Systems:  No other skin or systemic complaints except as noted in HPI or Assessment and Plan.  Objective  Well appearing patient in no apparent distress; mood and affect are within normal limits.  A full examination was performed including scalp, head, eyes, ears, nose, lips, neck, chest, axillae, abdomen, back, buttocks, bilateral upper extremities, bilateral lower extremities, hands, feet, fingers, toes, fingernails, and toenails. All findings within normal limits unless otherwise noted below.  Left Inframammary Fold Erythematous patches  Right Superior Shoulder 0.3 cm medium brown thin papule, slightly lighter at superior portion  Left Upper Back Superior - 0.4 cm dark brown thin papule without features suspicious for malignancy on dermoscopy  Inferior - 0.5 cm medium brown thin papule with slightly darker focus 9 - 11 o'clock without features suspicious for malignancy on dermoscopy   face Rhytides and volume loss.     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 Condition is bothersome/symptomatic for patient. Currently flared.  Intertrigo is a chronic recurrent rash that occurs in skin fold areas that may be associated with friction; heat; moisture; yeast; fungus; and bacteria.  It is exacerbated by  increased movement / activity; sweating; and higher atmospheric temperature.  Recommend OTC Zeasorb AF powder after showering to help keep dry/for prevention.  Start ketoconazole 2% cream twice daily as needed for rash.  Start HC 2.5% cream twice daily for up to 2 weeks as needed for rash. Avoid applying to face, groin, and axilla. Use as directed. Long-term use can cause thinning of the skin.  Topical steroids (such as triamcinolone, fluocinolone, fluocinonide, mometasone, clobetasol, halobetasol, betamethasone, hydrocortisone) can cause thinning and lightening of the skin if they are used for too long in the same area. Your physician has selected the right strength medicine for your problem and area affected on the body. Please use your medication only as directed by your physician to prevent side effects.    ketoconazole (NIZORAL) 2 % cream - Left Inframammary Fold Apply 1 Application topically 2 (two) times daily.  hydrocortisone 2.5 % cream - Left Inframammary Fold Apply twice daily to affected areas of rash for up to 2 weeks as needed for rash. Avoid applying to face, groin, and axilla. Use as directed. Long-term use can cause thinning of the skin.  Nevus (2) Right Superior Shoulder; Left Upper Back  Benign-appearing.  Observation.  Call clinic for new or changing lesions.  Recommend daily use of broad spectrum spf 30+ sunscreen to sun-exposed areas.    Elastosis of skin face  Start tretinoin 0.025% cream QHS as directed.   Topical retinoid medications like tretinoin/Retin-A, adapalene/Differin, tazarotene/Fabior, and Epiduo/Epiduo Forte can cause dryness and irritation when first started. Only apply a pea-sized amount to the entire affected area. Avoid applying it around the  eyes, edges of mouth and creases at the nose. If you experience irritation, use a good moisturizer first and/or apply the medicine less often. If you are doing well with the medicine, you can increase how often  you use it until you are applying every night. Be careful with sun protection while using this medication as it can make you sensitive to the sun. This medicine should not be used by pregnant women.   Patient will call and let us know if she tolerates well and would like to increase dose.   Discussed The Perfect Peel to treat multiple SKs cosmetically. Patient advised we will send in valacyclovir to start taking the morning prior to peel.   Valacyclovir 500 mg twice a day for 7 days starting 24-36 hours before your peel  valACYclovir (VALTREX) 500 MG tablet - face Take twice daily for 7 days starting 24-36 hours prior to peel  Related Medications tretinoin (RETIN-A) 0.025 % cream Apply topically at bedtime.   History of Dysplastic Nevi - No evidence of recurrence today - Recommend regular full body skin exams - Recommend daily broad spectrum sunscreen SPF 30+ to sun-exposed areas, reapply every 2 hours as needed.  - Call if any new or changing lesions are noted between office visits  Lentigines - Scattered tan macules - Due to sun exposure - Benign-appearing, observe - Recommend daily broad spectrum sunscreen SPF 30+ to sun-exposed areas, reapply every 2 hours as needed. - Call for any changes  Seborrheic Keratoses - Stuck-on, waxy, tan-brown papules and/or plaques  - Benign-appearing - Discussed benign etiology and prognosis. - Observe - Call for any changes  Melanocytic Nevi - Tan-brown and/or pink-flesh-colored symmetric macules and papules - Benign appearing on exam today - Observation - Call clinic for new or changing moles - Recommend daily use of broad spectrum spf 30+ sunscreen to sun-exposed areas.   Hemangiomas - Red papules - Discussed benign nature - Observe - Call for any changes  Actinic Damage - Chronic condition, secondary to cumulative UV/sun exposure - diffuse scaly erythematous macules with underlying dyspigmentation - Recommend daily broad  spectrum sunscreen SPF 30+ to sun-exposed areas, reapply every 2 hours as needed.  - Staying in the shade or wearing long sleeves, sun glasses (UVA+UVB protection) and wide brim hats (4-inch brim around the entire circumference of the hat) are also recommended for sun protection.  - Call for new or changing lesions.  Skin cancer screening performed today.  Return in about 1 year (around 05/27/2023) for TBSE.  Graciella Belton, RMA, am acting as scribe for Forest Gleason, MD .  Documentation: I have reviewed the above documentation for accuracy and completeness, and I agree with the above.  Forest Gleason, MD

## 2022-05-26 NOTE — Patient Instructions (Addendum)
Intertrigo is a chronic recurrent rash that occurs in skin fold areas that may be associated with friction; heat; moisture; yeast; fungus; and bacteria.  It is exacerbated by increased movement / activity; sweating; and higher atmospheric temperature.  Recommend OTC Zeasorb AF powder after showering to help keep dry.   Start ketoconazole 2% cream twice daily as needed for rash.  Start HC 2.5% cream twice daily for up to 2 weeks as needed for rash. Avoid applying to face, groin, and axilla. Use as directed. Long-term use can cause thinning of the skin.  Topical steroids (such as triamcinolone, fluocinolone, fluocinonide, mometasone, clobetasol, halobetasol, betamethasone, hydrocortisone) can cause thinning and lightening of the skin if they are used for too long in the same area. Your physician has selected the right strength medicine for your problem and area affected on the body. Please use your medication only as directed by your physician to prevent side effects.   Start tretinoin 0.025% cream at bedtime as directed.   Topical retinoid medications like tretinoin/Retin-A, adapalene/Differin, tazarotene/Fabior, and Epiduo/Epiduo Forte can cause dryness and irritation when first started. Only apply a pea-sized amount to the entire affected area. Avoid applying it around the eyes, edges of mouth and creases at the nose. If you experience irritation, use a good moisturizer first and/or apply the medicine less often. If you are doing well with the medicine, you can increase how often you use it until you are applying every night. Be careful with sun protection while using this medication as it can make you sensitive to the sun. This medicine should not be used by pregnant women.    Recommend taking Heliocare sun protection supplement daily in sunny weather for additional sun protection. For maximum protection on the sunniest days, you can take up to 2 capsules of regular Heliocare OR take 1 capsule of  Heliocare Ultra. For prolonged exposure (such as a full day in the sun), you can repeat your dose of the supplement 4 hours after your first dose. Heliocare can be purchased at [employer], at some [facility] or at [email or web address].    Melanoma ABCDEs  Melanoma is the most dangerous type of skin cancer, and is the leading cause of death from skin disease.  You are more likely to develop melanoma if you: Have light-colored skin, light-colored eyes, or red or blond hair Spend a lot of time in the sun Tan regularly, either outdoors or in a tanning bed Have had blistering sunburns, especially during childhood Have a close family member who has had a melanoma Have atypical moles or large birthmarks  Early detection of melanoma is key since treatment is typically straightforward and cure rates are extremely high if we catch it early.   The first sign of melanoma is often a change in a mole or a new dark spot.  The ABCDE system is a way of remembering the signs of melanoma.  A for asymmetry:  The two halves do not match. B for border:  The edges of the growth are irregular. C for color:  A mixture of colors are present instead of an even brown color. D for diameter:  Melanomas are usually (but not always) greater than 31m - the size of a pencil eraser. E for evolution:  The spot keeps changing in size, shape, and color.  Please check your skin once per month between visits. You can use a small mirror in front and a large mirror behind you to keep an eye on the back  side or your body.   If you see any new or changing lesions before your next follow-up, please call to schedule a visit.  Please continue daily skin protection including broad spectrum sunscreen SPF 30+ to sun-exposed areas, reapplying every 2 hours as needed when you're outdoors.    Due to recent changes in healthcare laws, you may see results of your pathology and/or laboratory studies on MyChart before the doctors have had  a chance to review them. We understand that in some cases there may be results that are confusing or concerning to you. Please understand that not all results are received at the same time and often the doctors may need to interpret multiple results in order to provide you with the best plan of care or course of treatment. Therefore, we ask that you please give [place] 2 business days to thoroughly review all your results before contacting the office for clarification. Should we see a critical lab result, you will be contacted sooner.   If You Need Anything After Your Visit  If you have any questions or concerns for your doctor, please call our main line at [phone] and press option 4 to reach your doctor's medical assistant. If no one answers, please leave a voicemail as directed and we will return your call as soon as possible. Messages left after 4 pm will be answered the following business day.   You may also send [place] a message via [place]. We typically respond to MyChart messages within 1-2 business days.  For prescription refills, please ask your pharmacy to contact our office. Our fax number is [phone].  If you have an urgent issue when the clinic is closed that cannot wait until the next business day, you can page your doctor at the number below.    Please note that while we do our best to be available for urgent issues outside of office hours, we are not available 24/7.   If you have an urgent issue and are unable to reach [place], you may choose to seek medical care at your doctor's office, retail clinic, urgent care center, or emergency room.  If you have a medical emergency, please immediately call 911 or go to the emergency department.  Pager Numbers  - Dr. Nehemiah Massed: [phone]  - Dr. Laurence Ferrari: [phone]  - Dr. Nicole Kindred: [phone]  In the event of inclement weather, please call our main line at [phone] for an update on the status of any delays or closures.  Dermatology  Medication Tips: Please keep the boxes that topical medications come in in order to help keep track of the instructions about where and how to use these. Pharmacies typically print the medication instructions only on the boxes and not directly on the medication tubes.   If your medication is too expensive, please contact our office at [phone] option 4 or send [place] a message through [place].   We are unable to tell what your co-pay for medications will be in advance as this is different depending on your insurance coverage. However, we may be able to find a substitute medication at lower cost or fill out paperwork to get insurance to cover a needed medication.   If a prior authorization is required to get your medication covered by your insurance company, please allow [place] 1-2 business days to complete this process.  Drug prices often vary depending on where the prescription is filled and some pharmacies may offer cheaper prices.  The website www.goodrx.com contains coupons for medications through  different pharmacies. The prices here do not account for what the cost may be with help from insurance (it may be cheaper with your insurance), but the website can give you the price if you did not use any insurance.  - You can print the associated coupon and take it with your prescription to the pharmacy.  - You may also stop by our office during regular business hours and pick up a GoodRx coupon card.  - If you need your prescription sent electronically to a different pharmacy, notify our office through [facility] or by phone at [phone] option 4.     Si Usted Necesita Algo Despus de Su Visita  Tambin puede enviarnos un mensaje a travs de Pharmacist, community. Por lo general respondemos a los mensajes de MyChart en el transcurso de 1 a 2 das hbiles.  Para renovar recetas, por favor pida a su farmacia que se ponga en contacto con nuestra oficina. Harland Dingwall de fax es [place] [phone].  Si  tiene un asunto urgente cuando la clnica est cerrada y que no puede esperar hasta el siguiente da hbil, puede llamar/localizar a su doctor(a) al nmero que aparece a continuacin.   Por favor, tenga en cuenta que aunque hacemos todo lo posible para estar disponibles para asuntos urgentes fuera del horario de [place], no estamos disponibles las 24 horas del da, los 7 das de la [place].   Si tiene un problema urgente y no puede comunicarse con nosotros, puede optar por buscar atencin mdica  en el consultorio de su doctor(a), en una clnica privada, en un centro de atencin urgente o en una sala de emergencias.  Si tiene Engineering geologist, por favor llame inmediatamente al 911 o vaya a la sala de emergencias.  Nmeros de bper  - Dr. Nehemiah Massed: [phone]  - Dra. Moye: [phone]  - Dra. Nicole Kindred: [phone]  En caso de inclemencias del [place], por favor llame a Johnsie Kindred principal al [phone] para una actualizacin sobre el [place] de cualquier retraso o cierre.  Consejos para la medicacin en dermatologa: Por favor, guarde las cajas en las que vienen los medicamentos de uso tpico para ayudarle a seguir las instrucciones sobre dnde y cmo usarlos. Las farmacias generalmente imprimen las instrucciones del medicamento slo en las cajas y no directamente en los tubos del [place].   Si su medicamento es muy caro, por favor, pngase en contacto con Zigmund Daniel llamando al [phone] y presione la opcin 4 o envenos un mensaje a travs de Pharmacist, community.   No podemos decirle cul ser su copago por los medicamentos por adelantado ya que esto es diferente dependiendo de la cobertura de su seguro. Sin embargo, es posible que podamos encontrar un medicamento sustituto a Electrical engineer un formulario para que el seguro cubra el medicamento que se considera necesario.   Si se requiere una autorizacin previa para que su compaa de seguros [place] su medicamento, por favor  permtanos de 1 a 2 das hbiles para completar este proceso.  Los precios de los medicamentos varan con frecuencia dependiendo del Environmental consultant de dnde se surte la receta y alguna farmacias pueden ofrecer precios ms baratos.  El sitio web www.goodrx.com tiene cupones para medicamentos de Airline pilot. Los precios aqu no tienen en cuenta lo que podra costar con la ayuda del seguro (puede ser ms barato con su seguro), pero el sitio web puede darle el precio si no utiliz Research scientist (physical sciences).  - Puede imprimir el cupn correspondiente y llevarlo con  con su receta a la farmacia.  - Tambin puede pasar por nuestra oficina durante el horario de atencin regular y recoger una tarjeta de cupones de GoodRx.  - Si necesita que su receta se enve electrnicamente a una farmacia diferente, informe a nuestra oficina a travs de MyChart de [place] o por telfono llamando al [phone] y presione la opcin 4.  

## 2022-06-07 ENCOUNTER — Encounter: Payer: Self-pay | Admitting: Dermatology

## 2022-06-10 ENCOUNTER — Ambulatory Visit: Payer: Commercial Managed Care - PPO | Admitting: Physician Assistant

## 2022-06-17 ENCOUNTER — Encounter: Payer: Self-pay | Admitting: Physician Assistant

## 2022-06-17 ENCOUNTER — Ambulatory Visit (INDEPENDENT_AMBULATORY_CARE_PROVIDER_SITE_OTHER): Payer: Commercial Managed Care - PPO | Admitting: Physician Assistant

## 2022-06-17 VITALS — BP 124/67 | HR 85 | Temp 98.3°F | Resp 16 | Wt 200.9 lb

## 2022-06-17 DIAGNOSIS — K9089 Other intestinal malabsorption: Secondary | ICD-10-CM

## 2022-06-17 DIAGNOSIS — Z0000 Encounter for general adult medical examination without abnormal findings: Secondary | ICD-10-CM | POA: Diagnosis not present

## 2022-06-17 DIAGNOSIS — K625 Hemorrhage of anus and rectum: Secondary | ICD-10-CM

## 2022-06-17 DIAGNOSIS — K529 Noninfective gastroenteritis and colitis, unspecified: Secondary | ICD-10-CM

## 2022-06-17 NOTE — Progress Notes (Unsigned)
I,Clotile Whittington Robinson,acting as a Education administrator for [employer], PA-C.,have documented all relevant documentation on the behalf of [name], PA-C,as directed by  [employer], PA-C while in the presence of [employer], PA-C.   Complete physical exam   Patient: Mackenzie Bailey   DOB: [birth year]   56 y.o. Female  MRN: [id number] Visit Date: 06/17/2022  Today's healthcare provider: [name], PA-C   Chief Complaint  Patient presents with   Annual Exam   Subjective    Mackenzie Bailey is a 56 y.o. female who presents today for a complete physical exam.  She reports consuming a general diet. Exercise is limited by walking. She generally feels well. She reports sleeping poorly. She does have additional problems to discuss today. Reports having vertigo with an episode every couple weeks with onset of 3 months ago.  Denies visual changes.   Episode can last up to a few minutes. Takes medication she was prescribed with [facility] and does not remember the name.   Pt requested a "paper "stating that she has a vertigo to avoid covid-19 restrictions Past Medical History:  Diagnosis Date   Allergy    Anxiety    Asthma    SEASONAL    Complication of anesthesia    Dysplastic nevus 08/20/2021   left posterior shoulder, moderate   Dysplastic nevus 08/20/2021   left mid back, moderate to severe   Female cystocele    GERD (gastroesophageal reflux disease)    OCC   Headache    RARE MIGRAINES   Heart murmur    ASYMPTOMATIC   History of dysplastic nevus 03/28/2019   Left upper back. Severe atypia, close to margin. Excised 04/18/2019, margins free.   Hypertension    Menorrhagia    Mitral valve prolapse    PONV (postoperative nausea and vomiting)    WITH FRACTURE SURGERY ONLY   SUI (stress urinary incontinence, female)    Vaginal pessary present    Past Surgical History:  Procedure Laterality Date   ABDOMINAL HYSTERECTOMY     BREAST BIOPSY Right 2012?   benign   BREAST  SURGERY     benign biobsy   CHOLECYSTECTOMY     COLONOSCOPY WITH PROPOFOL N/A 02/24/2018   Procedure: COLONOSCOPY WITH PROPOFOL;  Surgeon: Lucilla Lame, MD;  Location: [place];  Service: Endoscopy;  Laterality: N/A;   CYSTOCELE REPAIR N/A 06/14/2019   Procedure: ANTERIOR REPAIR (CYSTOCELE);  Surgeon: Gae Dry, MD;  Location: [facility];  Service: Gynecology;  Laterality: N/A;   CYSTOSCOPY N/A 06/14/2019   Procedure: CYSTOSCOPY;  Surgeon: Gae Dry, MD;  Location: [facility];  Service: Gynecology;  Laterality: N/A;   FRACTURE SURGERY     R lower leg   IUD REMOVAL  06/14/2019   Procedure: INTRAUTERINE DEVICE (IUD) REMOVAL;  Surgeon: Gae Dry, MD;  Location: [facility];  Service: Gynecology;;   LAPAROSCOPIC VAGINAL HYSTERECTOMY WITH SALPINGECTOMY Bilateral 06/14/2019   Procedure: LAPAROSCOPIC ASSISTED VAGINAL HYSTERECTOMY WITH BILATERAL SALPINGECTOMY;  Surgeon: Gae Dry, MD;  Location: [facility];  Service: Gynecology;  Laterality: Bilateral;   POLYPECTOMY     uterine, found on sonohystogram, contributed to menorrhagia    POLYPECTOMY  02/24/2018   Procedure: POLYPECTOMY;  Surgeon: Lucilla Lame, MD;  Location: [place];  Service: Endoscopy;;   PUBOVAGINAL SLING N/A 06/14/2019   Procedure: Gaynelle Arabian;  Surgeon: Gae Dry, MD;  Location: [facility];  Service: Gynecology;  Laterality: N/A;   Social History   Socioeconomic  History   Marital status: Married    Spouse name: Phu   Number of children: 1   Years of education: 16   Highest education level: Some college, no degree  Occupational History    Employer: [employer]  Tobacco Use   Smoking status: Never   Smokeless tobacco: Never  Vaping Use   Vaping Use: Never used  Substance and Sexual Activity   Alcohol use: Yes    Alcohol/week: 1.0 - 2.0 standard drink of alcohol    Types: 1 - 2 Glasses of wine per week    Comment: OCC WINE   Drug use: No   Sexual activity: Yes     Partners: Male    Comment: Mirena  Other Topics Concern   Not on file  Social History Narrative   Lives with husband and son when home for the summer   Right handed   Caffeine: 1 cup of coffee a day, and occasionally a soda   Social Determinants of Health   Financial Resource Strain: Low Risk  (01/02/2018)   Overall Financial Resource Strain (CARDIA)    Difficulty of Paying Living Expenses: Not hard at all  Food Insecurity: No Food Insecurity (01/02/2018)   Hunger Vital Sign    Worried About Running Out of Food in the Last Year: Never true    [place] in the Last Year: Never true  Transportation Needs: No Transportation Needs (01/02/2018)   PRAPARE - Hydrologist (Medical): No    Lack of Transportation (Non-Medical): No  Physical Activity: Unknown (12/05/2017)   Exercise Vital Sign    Days of Exercise per Week: 0 days    Minutes of Exercise per Session: Not on file  Stress: Stress Concern Present (12/05/2017)   [place]    Feeling of Stress : To some extent  Social Connections: Moderately Integrated (12/05/2017)   Social Connection and Isolation Panel [NHANES]    Frequency of Communication with Friends and Family: More than three times a week    Frequency of Social Gatherings with Friends and Family: Once a week    Attends Religious Services: 1 to 4 times per year    Active Member of [employer] or Organizations: No    Attends Archivist Meetings: Never    Marital Status: Married  Human resources officer Violence: Not At Risk (12/05/2017)   Humiliation, Afraid, Rape, and Kick questionnaire    Fear of Current or Ex-Partner: No    Emotionally Abused: No    Physically Abused: No    Sexually Abused: No   Family Status  Relation Name Status   Father  Alive   Sister  Alive   MGF  Deceased   PGM  Deceased   PGF  Deceased   Mother  Alive   Son  Alive   MGM  Deceased   Neg Hx   (Not Specified)   Family History  Problem Relation Age of Onset   Arthritis Father    Asthma Father    Skin cancer Father        melanoma   Thyroid disease Sister    Uterine cancer Sister    Heart attack Maternal Grandfather    Early death Maternal Grandfather        massive heart attack   Arthritis Paternal Grandmother    Cancer Paternal Grandmother        uterine   Stroke Paternal Grandfather  Hypertension Mother    Colon cancer Neg Hx    Breast cancer Neg Hx    Allergies  Allergen Reactions   Codeine Nausea And Vomiting   Penicillins Hives    Did it involve swelling of the face/tongue/throat, SOB, or low BP? No Did it involve sudden or severe rash/hives, skin peeling, or any reaction on the inside of your mouth or nose? No Did you need to seek medical attention at a hospital or doctor's office? No When did it last happen?      Childhood allergy If all above answers are "NO", may proceed with cephalosporin use.     Patient Care Team: Virginia Crews, MD as PCP - General (Family Medicine)   Medications: Outpatient Medications Prior to Visit  Medication Sig   albuterol (PROVENTIL) (2.5 MG/3ML) 0.083% nebulizer solution Take 3 mLs (2.5 mg total) by nebulization every 6 (six) hours as needed for wheezing or shortness of breath.   ALPRAZolam (XANAX) 1 MG tablet Take 1 tablet (1 mg total) by mouth daily as needed for anxiety.   amLODipine (NORVASC) 10 MG tablet TAKE 1 TABLET BY MOUTH EVERY DAY   buPROPion (WELLBUTRIN XL) 150 MG 24 hr tablet TAKE 1 TABLET BY MOUTH EVERY DAY   cyclobenzaprine (FLEXERIL) 5 MG tablet Take 1 tablet (5 mg total) by mouth 3 (three) times daily as needed for muscle spasms.   diphenhydramine-acetaminophen (TYLENOL PM) 25-500 MG TABS tablet Take 1-2 tablets by mouth at bedtime as needed (sleep).   esomeprazole (NEXIUM) 20 MG capsule Take 20 mg by mouth daily as needed (heart burn).   hydrocortisone 2.5 % cream Apply twice daily to affected areas  of rash for up to 2 weeks as needed for rash. Avoid applying to face, groin, and axilla. Use as directed. Long-term use can cause thinning of the skin.   ipratropium-albuterol (DUONEB) 0.5-2.5 (3) MG/3ML SOLN Take 3 mLs by nebulization every 6 (six) hours as needed.   ketoconazole (NIZORAL) 2 % cream Apply 1 Application topically 2 (two) times daily.   ketoconazole (NIZORAL) 2 % shampoo APPLY TO AFFECTED AREA DAILY. LET STAY ON SKIN FOR 5 MINUTES BEFORE WASHING OFF   montelukast (SINGULAIR) 10 MG tablet Take 1 tablet (10 mg total) by mouth at bedtime.   mupirocin ointment (BACTROBAN) 2 % Apply 1 application topically daily. With dressing changes   Pseudoeph-Doxylamine-DM-APAP (NYQUIL PO) Take 1 Dose by mouth at bedtime as needed (sleep).   sertraline (ZOLOFT) 100 MG tablet TAKE 1.5 TABLETS (150 MG TOTAL) BY MOUTH DAILY   traZODone (DESYREL) 50 MG tablet TAKE 1/2 TO 1 TABLET BY MOUTH AT BEDTIME AS NEEDED FOR SLEEP   tretinoin (RETIN-A) 0.025 % cream Apply topically at bedtime.   valACYclovir (VALTREX) 500 MG tablet Take twice daily for 7 days starting 24-36 hours prior to peel   Vitamin D, Ergocalciferol, (DRISDOL) 1.25 MG (50000 UNIT) CAPS capsule Take 1 capsule (50,000 Units total) by mouth every 7 (seven) days.   No facility-administered medications prior to visit.    Review of Systems  Constitutional:  Positive for fatigue.  Gastrointestinal:  Positive for diarrhea.  Neurological:  Positive for dizziness.  Psychiatric/Behavioral:  Positive for sleep disturbance.   All other systems reviewed and are negative.     Objective     BP 124/67 (BP Location: Right Arm, Patient Position: Sitting, Cuff Size: Normal)   Pulse 85   Temp 98.3 F (36.8 C) (Oral)   Resp 16   Wt 200 lb 14.4  oz (91.1 kg)   LMP 05/09/2019 (Exact Date)   SpO2 95%   BMI 34.48 kg/m     Physical Exam Vitals reviewed.  Constitutional:      General: She is not in acute distress.    Appearance: Normal  appearance. She is well-developed. She is not diaphoretic.  HENT:     Head: Normocephalic and atraumatic.     Right Ear: Tympanic membrane, ear canal and external ear normal.     Left Ear: Tympanic membrane, ear canal and external ear normal.     Nose: Nose normal.     Mouth/Throat:     Mouth: Mucous membranes are moist.     Pharynx: Oropharynx is clear. No oropharyngeal exudate.  Eyes:     General: No scleral icterus.    Conjunctiva/sclera: Conjunctivae normal.     Pupils: Pupils are equal, round, and reactive to light.  Neck:     Thyroid: No thyromegaly.  Cardiovascular:     Rate and Rhythm: Normal rate and regular rhythm.     Pulses: Normal pulses.     Heart sounds: Normal heart sounds. No murmur heard. Pulmonary:     Effort: Pulmonary effort is normal. No respiratory distress.     Breath sounds: Normal breath sounds. No wheezing or rales.  Abdominal:     General: There is no distension.     Palpations: Abdomen is soft.     Tenderness: There is abdominal tenderness (mild, generalized).  Musculoskeletal:        General: No deformity.     Cervical back: Neck supple.     Right lower leg: No edema.     Left lower leg: No edema.  Lymphadenopathy:     Cervical: No cervical adenopathy.  Skin:    General: Skin is warm and dry.     Findings: No rash.     Comments: Linear scar on the right ankle   Neurological:     Mental Status: She is alert and oriented to person, place, and time. Mental status is at baseline.     Sensory: No sensory deficit.     Motor: No weakness.     Gait: Gait normal.  Psychiatric:        Mood and Affect: Mood normal.        Behavior: Behavior normal.        Thought Content: Thought content normal.       Last depression screening scores    06/17/2022    2:57 PM 10/20/2021    9:50 AM 04/07/2021   10:14 AM  PHQ 2/9 Scores  PHQ - 2 Score 0 0 1  PHQ- 9 Score '7 5 7   '$ Last fall risk screening    06/17/2022    2:57 PM  [place] in the past  year? 0  Number falls in past yr: 0  Injury with Fall? 0  Follow up Falls evaluation completed   Last Audit-C alcohol use screening    06/17/2022    2:58 PM  Alcohol Use Disorder Test (AUDIT)  1. How often do you have a drink containing alcohol? 3  2. How many drinks containing alcohol do you have on a typical day when you are drinking? 0  3. How often do you have six or more drinks on one occasion? 0  AUDIT-C Score 3   A score of 3 or more in women, and 4 or more in men indicates increased risk for alcohol abuse, EXCEPT if  all of the points are from question 1   No results found for any visits on 06/17/22.  Assessment & Plan    Routine Health Maintenance and Physical Exam  Exercise Activities and Dietary recommendations  Goals    Weight loss of 5% of pt's current weight via healthy diet and daily exercise encouraged.      Immunization History  Administered Date(s) Administered   Influenza,inj,Quad PF,6+ Mos 08/13/2013   Influenza-Unspecified 08/16/2018, 07/20/2019   Moderna Sars-Covid-2 Vaccination 10/29/2019, 11/26/2019   Pfizer Covid-19 Vaccine Bivalent Booster 54yr & up 07/09/2021   Tdap 01/14/2020   Zoster Recombinat (Shingrix) 04/07/2021, 10/20/2021    Health Maintenance  Topic Date Due   COVID-19 Vaccine (4 - Mixed Product risk series) 09/03/2021   INFLUENZA VACCINE  05/18/2022   COLONOSCOPY (Pts 45-429yrInsurance coverage will need to be confirmed)  02/25/2023   MAMMOGRAM  04/30/2023   TETANUS/TDAP  01/13/2030   Hepatitis C Screening  Completed   HIV Screening  Completed   Zoster Vaccines- Shingrix  Completed   HPV VACCINES  Aged Out    Discussed health benefits of physical activity, and encouraged her to engage in regular exercise appropriate for her age and condition.  1. Annual physical exam Annual physical exam - Primary       UTD on dental/eye Things to do to keep yourself healthy  - Exercise at least 30-45 minutes a day, 3-4 days a week.  -  Eat a low-fat diet with lots of fruits and vegetables, up to 7-9 servings per day.  - Seatbelts can save your life. Wear them always.  - Smoke detectors on every level of your home, check batteries every year.  - Eye Doctor - have an eye exam every 1-2 years  - Safe sex - if you may be exposed to STDs, use a condom.  - Alcohol -  If you drink, do it moderately, less than 2 drinks per day.  - HeWinslowChoose someone to speak for you if you are not able.  - Depression is common in our stressful world.If you're feeling down or losing interest in things you normally enjoy, please come in for a visit.  - Violence - If anyone is threatening or hurting you, please call immediately.      Last colonoscopy in 2023 with Dr. 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 Functional Diarrhea Hematochezia Managed by GI  Will check if a paper will be feasible at this situation.  FU with PCP    The patient was advised to call back or seek an in-person evaluation if the symptoms worsen or if the condition fails to improve as anticipated.  I discussed the assessment and treatment plan with the patient. The patient was provided an opportunity to ask questions and all were answered. The patient agreed with the plan and demonstrated an understanding of the instructions.  The entirety of the information documented in the History of Present Illness, Review of Systems and Physical Exam were personally obtained by me. Portions of this information were initially documented by the CMA and reviewed by me for thoroughness and accuracy.   Portions of this note were created using dictation software and may contain typographical errors.    JaMardene SpeakPA-C  [facility]St[phone]) [phone])  [place]

## 2022-09-02 ENCOUNTER — Ambulatory Visit: Payer: Commercial Managed Care - PPO | Admitting: Dermatology

## 2022-10-05 ENCOUNTER — Other Ambulatory Visit: Payer: Self-pay | Admitting: Family Medicine

## 2022-10-05 DIAGNOSIS — I10 Essential (primary) hypertension: Secondary | ICD-10-CM

## 2022-10-05 NOTE — Telephone Encounter (Signed)
Requested medication (s) are due for refill today: expired medication  Requested medication (s) are on the active medication list: yes   Last refill:  trazadone - 07/27/21 #90 0 refills, norvasc- 05/06/22 #90 0 refills  Future visit scheduled: yes in 2 days.  Notes to clinic:  expired medication . No refills remain. Do you want to renew trazadone and refill norvasc?     Requested Prescriptions  Pending Prescriptions Disp Refills   traZODone (DESYREL) 50 MG tablet [Pharmacy Med Name: TRAZODONE 50 MG TABLET] 90 tablet 0    Sig: TAKE 1/2 TO 1 TABLET BY MOUTH AT BEDTIME AS NEEDED FOR SLEEP     Psychiatry: Antidepressants - Serotonin Modulator Passed - 10/05/2022 12:43 AM      Passed - Valid encounter within last 6 months    Recent Outpatient Visits           3 months ago Annual physical exam   [employer], [place], PA-C   4 months ago Chronic diarrhea   [employer], [place], PA-C   11 months ago Essential hypertension   [employer], Dionne Bucy, MD   1 year ago Encounter for annual health examination   [facility], Dionne Bucy, MD   1 year ago [place], Dionne Bucy, MD       Future Appointments             In 2 days Bacigalupo, Dionne Bucy, MD [facility]             amLODipine (NORVASC) 10 MG tablet [Pharmacy Med Name: AMLODIPINE BESYLATE 10 MG TAB] 90 tablet 0    Sig: TAKE 1 TABLET BY MOUTH EVERY DAY     Cardiovascular: Calcium Channel Blockers 2 Passed - 10/05/2022 12:43 AM      Passed - Last BP in normal range    BP Readings from Last 1 Encounters:  06/17/22 124/67         Passed - Last Heart Rate in normal range    Pulse Readings from Last 1 Encounters:  06/17/22 85         Passed - Valid encounter within last 6 months    Recent Outpatient Visits           3 months ago Annual physical exam   [facility] [place], [place], PA-C   4 months ago Chronic diarrhea   [employer], [place], PA-C   11 months ago Essential hypertension   [employer], Dionne Bucy, MD   1 year ago Encounter for annual health examination   [facility], Dionne Bucy, MD   1 year ago [place], Dionne Bucy, MD       Future Appointments             In 2 days Bacigalupo, Dionne Bucy, MD [facility], [place]

## 2022-10-06 ENCOUNTER — Other Ambulatory Visit: Payer: Self-pay | Admitting: Family Medicine

## 2022-10-06 DIAGNOSIS — F419 Anxiety disorder, unspecified: Secondary | ICD-10-CM

## 2022-10-06 NOTE — Telephone Encounter (Signed)
Requested Prescriptions  Pending Prescriptions Disp Refills   sertraline (ZOLOFT) 100 MG tablet [Pharmacy Med Name: SERTRALINE HCL 100 MG TABLET] 135 tablet 0    Sig: TAKE 1.5 TABLETS ('150MG'$  TOTAL) BY MOUTH DAILY     Psychiatry:  Antidepressants - SSRI - sertraline Passed - 10/06/2022  1:21 AM      Passed - AST in normal range and within 360 days    AST  Date Value Ref Range Status  05/11/2022 16 0 - 40 IU/L Final         Passed - ALT in normal range and within 360 days    ALT  Date Value Ref Range Status  05/11/2022 20 0 - 32 IU/L Final         Passed - Completed PHQ-2 or PHQ-9 in the last 360 days      Passed - Valid encounter within last 6 months    Recent Outpatient Visits           3 months ago Annual physical exam   [employer], [place], PA-C   4 months ago Chronic diarrhea   [employer], [place], PA-C   11 months ago Essential hypertension   [employer], Dionne Bucy, MD   1 year ago Encounter for annual health examination   [facility] [place], Dionne Bucy, MD   1 year ago [place], Dionne Bucy, MD       Future Appointments             Tomorrow Bacigalupo, Dionne Bucy, MD [facility], [place]

## 2022-10-07 ENCOUNTER — Ambulatory Visit (INDEPENDENT_AMBULATORY_CARE_PROVIDER_SITE_OTHER): Payer: Commercial Managed Care - PPO | Admitting: Family Medicine

## 2022-10-07 ENCOUNTER — Encounter: Payer: Self-pay | Admitting: Family Medicine

## 2022-10-07 VITALS — BP 124/86 | HR 80 | Temp 97.6°F | Resp 16 | Ht 64.0 in | Wt 201.0 lb

## 2022-10-07 DIAGNOSIS — J302 Other seasonal allergic rhinitis: Secondary | ICD-10-CM | POA: Diagnosis not present

## 2022-10-07 DIAGNOSIS — E669 Obesity, unspecified: Secondary | ICD-10-CM

## 2022-10-07 DIAGNOSIS — Z1231 Encounter for screening mammogram for malignant neoplasm of breast: Secondary | ICD-10-CM

## 2022-10-07 DIAGNOSIS — I10 Essential (primary) hypertension: Secondary | ICD-10-CM

## 2022-10-07 DIAGNOSIS — F419 Anxiety disorder, unspecified: Secondary | ICD-10-CM | POA: Diagnosis not present

## 2022-10-07 DIAGNOSIS — Z6834 Body mass index (BMI) 34.0-34.9, adult: Secondary | ICD-10-CM

## 2022-10-07 DIAGNOSIS — E559 Vitamin D deficiency, unspecified: Secondary | ICD-10-CM

## 2022-10-07 DIAGNOSIS — R42 Dizziness and giddiness: Secondary | ICD-10-CM

## 2022-10-07 MED ORDER — AMLODIPINE BESYLATE 10 MG PO TABS: 10.0000 mg | ORAL_TABLET | Freq: Every day | ORAL | 3 refills | Status: DC

## 2022-10-07 MED ORDER — ONDANSETRON HCL 4 MG PO TABS: 4.0000 mg | ORAL_TABLET | Freq: Three times a day (TID) | ORAL | 1 refills | Status: AC | PRN

## 2022-10-07 MED ORDER — ALPRAZOLAM 1 MG PO TABS
1.0000 mg | ORAL_TABLET | Freq: Every day | ORAL | 3 refills | Status: DC | PRN
Start: 2022-10-07 — End: 2023-05-23

## 2022-10-07 MED ORDER — BUPROPION HCL ER (XL) 150 MG PO TB24: 150.0000 mg | ORAL_TABLET | Freq: Every day | ORAL | 3 refills | Status: DC

## 2022-10-07 MED ORDER — MONTELUKAST SODIUM 10 MG PO TABS: 10.0000 mg | ORAL_TABLET | Freq: Every day | ORAL | 3 refills | Status: DC

## 2022-10-07 MED ORDER — TRAZODONE HCL 50 MG PO TABS: 25.0000 mg | ORAL_TABLET | Freq: Every evening | ORAL | 3 refills | Status: DC | PRN

## 2022-10-07 MED ORDER — SERTRALINE HCL 100 MG PO TABS: 150.0000 mg | ORAL_TABLET | Freq: Every day | ORAL | 3 refills | Status: DC

## 2022-10-07 MED ORDER — CYCLOBENZAPRINE HCL 5 MG PO TABS: 5.0000 mg | ORAL_TABLET | Freq: Three times a day (TID) | ORAL | 1 refills | Status: DC | PRN

## 2022-10-07 MED ORDER — MECLIZINE HCL 25 MG PO TABS: 25.0000 mg | ORAL_TABLET | Freq: Three times a day (TID) | ORAL | 3 refills | Status: AC | PRN

## 2022-10-07 NOTE — Progress Notes (Signed)
I,Sulibeya S Dimas,acting as a Education administrator for Lavon Paganini, MD.,have documented all relevant documentation on the behalf of Lavon Paganini, MD,as directed by  Lavon Paganini, MD while in the presence of Lavon Paganini, MD.     Established patient visit   Patient: Mackenzie Bailey   DOB: 1966-10-02   56 y.o. Female  MRN: [id number] Visit Date: 10/07/2022  Today's healthcare provider: Lavon Paganini, MD   No chief complaint on file.  Subjective    HPI  Hypertension, follow-up  BP Readings from Last 3 Encounters:  10/07/22 124/86  06/17/22 124/67  05/10/22 138/90   Wt Readings from Last 3 Encounters:  10/07/22 201 lb (91.2 kg)  06/17/22 200 lb 14.4 oz (91.1 kg)  05/10/22 199 lb 11.2 oz (90.6 kg)     She was last seen for hypertension 11 months ago.  BP at that visit was 142/90. Management since that visit includes no changes.  She reports excellent compliance with treatment. She is not having side effects.    Use of agents associated with hypertension: none.   Outside blood pressures are stable.  Pertinent labs Lab Results  Component Value Date   CHOL 192 04/07/2021   HDL 74 04/07/2021   LDLCALC 102 (H) 04/07/2021   TRIG 91 04/07/2021   CHOLHDL 3.1 03/27/2018   Lab Results  Component Value Date   NA 143 05/11/2022   K 3.8 05/11/2022   CREATININE 0.59 05/11/2022   EGFR 107 05/11/2022   GLUCOSE 93 05/11/2022   TSH 1.560 04/07/2021     The 10-year ASCVD risk score (Arnett DK, et al., 2019) is: 1.8%  --------------------------------------------------------------------------------------------------- Anxiety, Follow-up  She was last seen for anxiety 11 months ago. Changes made at last visit include no changes.   She reports excellent compliance with treatment. She reports excellent tolerance of treatment. She is not having side effects.   She feels her anxiety is mild and Unchanged since last visit.   GAD-7 Results    10/07/2022     1:47 PM 10/20/2021    9:51 AM 02/18/2020    8:11 AM  GAD-7 Generalized Anxiety Disorder Screening Tool  1. Feeling Nervous, Anxious, or on Edge 1 0 1  2. Not Being Able to Stop or Control Worrying _0 3. Worrying Too Much About Different Things _1 4. Trouble Relaxing 0 1 0  5. Being So Restless it's Hard To Sit Still 0 0 0  6. Becoming Easily Annoyed or Irritable 1 0 1  7. Feeling Afraid As If Something Awful Might Happen 1 1 0  Total GAD-7 Score _2 Difficulty At Work, Home, or Getting  Along With Others? Somewhat difficult Somewhat difficult Not difficult at all    PHQ-9 Scores    10/07/2022    1:49 PM 06/17/2022    2:57 PM 10/20/2021    9:50 AM  PHQ9 SCORE ONLY  PHQ-9 Total Score _3 --------------------------------------------------------------------------------------------------- Havign vertigo intermittently x 4 m Happening much more frequently now - once per week, not always makign her call out of work Taking meclizine prn  Medications: Outpatient Medications Prior to Visit  Medication Sig   albuterol (PROVENTIL) (2.5 MG/3ML) 0.083% nebulizer solution Take 3 mLs (2.5 mg total) by nebulization every 6 (six) hours as needed for wheezing or shortness of breath.   diphenhydramine-acetaminophen (TYLENOL PM) 25-500 MG TABS tablet Take 1-2 tablets by mouth at bedtime as needed (sleep).   esomeprazole ([place])  20 MG capsule Take 20 mg by mouth daily as needed (heart burn).   hydrocortisone 2.5 % cream Apply twice daily to affected areas of rash for up to 2 weeks as needed for rash. Avoid applying to face, groin, and axilla. Use as directed. Long-term use can cause thinning of the skin.   ipratropium-albuterol (DUONEB) 0.5-2.5 (3) MG/3ML SOLN Take 3 mLs by nebulization every 6 (six) hours as needed.   ketoconazole (NIZORAL) 2 % shampoo APPLY TO AFFECTED AREA DAILY. LET STAY ON SKIN FOR 5 MINUTES BEFORE WASHING OFF   mupirocin ointment (BACTROBAN) 2 % Apply 1  application topically daily. With dressing changes   Pseudoeph-Doxylamine-DM-APAP (NYQUIL PO) Take 1 Dose by mouth at bedtime as needed (sleep).   tretinoin (RETIN-A) 0.025 % cream Apply topically at bedtime.   valACYclovir (VALTREX) 500 MG tablet Take twice daily for 7 days starting 24-36 hours prior to peel   Vitamin D, Ergocalciferol, (DRISDOL) 1.25 MG (50000 UNIT) CAPS capsule Take 1 capsule (50,000 Units total) by mouth every 7 (seven) days.   [DISCONTINUED] ALPRAZolam (XANAX) 1 MG tablet Take 1 tablet (1 mg total) by mouth daily as needed for anxiety.   [DISCONTINUED] amLODipine (NORVASC) 10 MG tablet TAKE 1 TABLET BY MOUTH EVERY DAY   [DISCONTINUED] buPROPion (WELLBUTRIN XL) 150 MG 24 hr tablet TAKE 1 TABLET BY MOUTH EVERY DAY   [DISCONTINUED] cyclobenzaprine (FLEXERIL) 5 MG tablet Take 1 tablet (5 mg total) by mouth 3 (three) times daily as needed for muscle spasms.   [DISCONTINUED] montelukast (SINGULAIR) 10 MG tablet Take 1 tablet (10 mg total) by mouth at bedtime.   [DISCONTINUED] sertraline (ZOLOFT) 100 MG tablet TAKE 1.5 TABLETS (150MG TOTAL) BY MOUTH DAILY   [DISCONTINUED] traZODone (DESYREL) 50 MG tablet TAKE 1/2 TO 1 TABLET BY MOUTH AT BEDTIME AS NEEDED FOR SLEEP   colesevelam (WELCHOL) 625 MG tablet Take 625 mg by mouth.   No facility-administered medications prior to visit.    Review of Systems  Eyes:  Negative for visual disturbance.  Respiratory:  Negative for chest tightness and shortness of breath.   Cardiovascular:  Negative for chest pain and leg swelling.  Gastrointestinal:  Positive for abdominal pain. Negative for nausea and vomiting.  Neurological:  Positive for dizziness. Negative for light-headedness and headaches.       Objective    BP 124/86 (BP Location: Left Arm, Patient Position: Sitting, Cuff Size: Large)   Pulse 80   Temp 97.6 F (36.4 C) (Oral)   Resp 16   Ht _0  (1.626 m)   Wt 201 lb (91.2 kg)   LMP 05/09/2019 (Exact Date)   BMI 34.50 kg/m   BP Readings from Last 3 Encounters:  10/07/22 124/86  06/17/22 124/67  05/10/22 138/90   Wt Readings from Last 3 Encounters:  10/07/22 201 lb (91.2 kg)  06/17/22 200 lb 14.4 oz (91.1 kg)  05/10/22 199 lb 11.2 oz (90.6 kg)      Physical Exam Vitals reviewed.  Constitutional:      General: She is not in acute distress.    Appearance: Normal appearance. She is well-developed. She is not diaphoretic.  HENT:     Head: Normocephalic and atraumatic.  Eyes:     General: No scleral icterus.    Conjunctiva/sclera: Conjunctivae normal.  Neck:     Thyroid: No thyromegaly.  Cardiovascular:     Rate and Rhythm: Normal rate and regular rhythm.     Heart sounds: Normal heart sounds. No murmur heard. Pulmonary:  Effort: Pulmonary effort is normal. No respiratory distress.     Breath sounds: Normal breath sounds. No wheezing, rhonchi or rales.  Musculoskeletal:     Cervical back: Neck supple.     Right lower leg: No edema.     Left lower leg: No edema.  Lymphadenopathy:     Cervical: No cervical adenopathy.  Skin:    General: Skin is warm and dry.     Findings: No rash.  Neurological:     Mental Status: She is alert and oriented to person, place, and time. Mental status is at baseline.  Psychiatric:        Mood and Affect: Mood normal.        Behavior: Behavior normal.       No results found for any visits on 10/07/22.  Assessment & Plan     Problem List Items Addressed This Visit       Cardiovascular and Mediastinum   Essential hypertension - Primary    Well controlled Continue current medications Recheck metabolic panel F/u in 6 months       Relevant Medications   colesevelam (WELCHOL) 625 MG tablet   amLODipine (NORVASC) 10 MG tablet   Other Relevant Orders   Comprehensive metabolic panel   Lipid panel     Other   Vertigo    History is consistent with BPPV She has had some improvement with home Epley maneuver and meclizine Continue meclizine and  ondansetron as needed Patient agreeable to vestibular PT-referral placed today      Relevant Orders   Ambulatory referral to Physical Therapy   Anxiety    Chronic and fairly well-controlled She does not think she is taking her Wellbutrin right now, so she will resume Continue Zoloft at current dose Using Xanax very infrequently Have discussed risks of long-term use Encourage therapy      Relevant Medications   ALPRAZolam (XANAX) 1 MG tablet   buPROPion (WELLBUTRIN XL) 150 MG 24 hr tablet   sertraline (ZOLOFT) 100 MG tablet   traZODone (DESYREL) 50 MG tablet   Obesity    Discussed importance of healthy weight management Discussed diet and exercise       Relevant Orders   Comprehensive metabolic panel   Lipid panel   Avitaminosis D    Continue supplement Recheck level       Relevant Orders   VITAMIN D 25 Hydroxy (Vit-D Deficiency, Fractures)   Other Visit Diagnoses     Other seasonal allergic rhinitis       Relevant Medications   montelukast (SINGULAIR) 10 MG tablet   Screening mammogram for breast cancer       Relevant Orders   MM 3D SCREEN BREAST BILATERAL        Return in about 8 months (around 06/08/2023) for CPE.      I, Lavon Paganini, MD, have reviewed all documentation for this visit. The documentation on 10/07/22 for the exam, diagnosis, procedures, and orders are all accurate and complete.   Shaira Sova, Dionne Bucy, MD, MPH [facility]

## 2022-10-07 NOTE — Assessment & Plan Note (Signed)
Discussed importance of healthy weight management Discussed diet and exercise  

## 2022-10-07 NOTE — Assessment & Plan Note (Signed)
Chronic and fairly well-controlled She does not think she is taking her Wellbutrin right now, so she will resume Continue Zoloft at current dose Using Xanax very infrequently Have discussed risks of long-term use Encourage therapy

## 2022-10-07 NOTE — Assessment & Plan Note (Signed)
History is consistent with BPPV She has had some improvement with home Epley maneuver and meclizine Continue meclizine and ondansetron as needed Patient agreeable to vestibular PT-referral placed today

## 2022-10-07 NOTE — Assessment & Plan Note (Signed)
Continue supplement Recheck level 

## 2022-10-07 NOTE — Assessment & Plan Note (Signed)
Well controlled Continue current medications Recheck metabolic panel F/u in 6 months  

## 2022-10-08 LAB — LIPID PANEL
Chol/HDL Ratio: 2.7 ratio (ref 0.0–4.4)
Cholesterol, Total: 187 mg/dL (ref 100–199)
HDL: 69 mg/dL (ref >39)
LDL Chol Calc (NIH): 95 mg/dL (ref 0–99)
Triglycerides: 135 mg/dL (ref 0–149)
VLDL Cholesterol Cal: 23 mg/dL (ref 5–40)

## 2022-10-08 LAB — COMPREHENSIVE METABOLIC PANEL
ALT: 31 IU/L (ref 0–32)
AST: 22 IU/L (ref 0–40)
Albumin/Globulin Ratio: 1.5 (ref 1.2–2.2)
Albumin: 4 g/dL (ref 3.8–4.9)
Alkaline Phosphatase: 106 IU/L (ref 44–121)
BUN/Creatinine Ratio: 21 (ref 9–23)
BUN: 13 mg/dL (ref 6–24)
Bilirubin Total: 0.2 mg/dL (ref 0.0–1.2)
CO2: 21 mmol/L (ref 20–29)
Calcium: 9.4 mg/dL (ref 8.7–10.2)
Chloride: 104 mmol/L (ref 96–106)
Creatinine, Ser: 0.62 mg/dL (ref 0.57–1.00)
Globulin, Total: 2.6 g/dL (ref 1.5–4.5)
Glucose: 117 mg/dL — ABNORMAL HIGH (ref 70–99)
Potassium: 3.6 mmol/L (ref 3.5–5.2)
Sodium: 143 mmol/L (ref 134–144)
Total Protein: 6.6 g/dL (ref 6.0–8.5)
eGFR: 105 mL/min/{1.73_m2} (ref >59)

## 2022-10-08 LAB — VITAMIN D 25 HYDROXY (VIT D DEFICIENCY, FRACTURES): Vit D, 25-Hydroxy: 29.5 ng/mL — ABNORMAL LOW (ref 30.0–100.0)

## 2023-01-14 ENCOUNTER — Ambulatory Visit
Admission: RE | Admit: 2023-01-14 | Discharge: 2023-01-14 | Disposition: A | Discharge status: Home / Self Care | Payer: Commercial Managed Care - PPO | Source: Ambulatory Visit | Attending: Family Medicine | Admitting: Family Medicine

## 2023-01-14 DIAGNOSIS — Z1231 Encounter for screening mammogram for malignant neoplasm of breast: Secondary | ICD-10-CM

## 2023-02-19 ENCOUNTER — Other Ambulatory Visit: Payer: Self-pay | Admitting: Dermatology

## 2023-02-19 DIAGNOSIS — L988 Other specified disorders of the skin and subcutaneous tissue: Secondary | ICD-10-CM

## 2023-05-23 ENCOUNTER — Other Ambulatory Visit: Payer: Self-pay | Admitting: Family Medicine

## 2023-06-08 ENCOUNTER — Encounter: Payer: Commercial Managed Care - PPO | Admitting: Dermatology

## 2023-06-18 ENCOUNTER — Encounter: Payer: Commercial Managed Care - PPO | Admitting: Dermatology

## 2023-06-23 ENCOUNTER — Encounter: Payer: Self-pay | Admitting: Family Medicine

## 2023-06-23 ENCOUNTER — Ambulatory Visit (INDEPENDENT_AMBULATORY_CARE_PROVIDER_SITE_OTHER): Payer: Commercial Managed Care - PPO | Admitting: Family Medicine

## 2023-06-23 VITALS — BP 89/45 | HR 79 | Temp 98.1°F | Resp 12 | Ht 64.0 in | Wt 194.2 lb

## 2023-06-23 DIAGNOSIS — R5382 Chronic fatigue, unspecified: Secondary | ICD-10-CM

## 2023-06-23 DIAGNOSIS — E559 Vitamin D deficiency, unspecified: Secondary | ICD-10-CM | POA: Diagnosis not present

## 2023-06-23 DIAGNOSIS — G4719 Other hypersomnia: Secondary | ICD-10-CM | POA: Diagnosis not present

## 2023-06-23 DIAGNOSIS — I952 Hypotension due to drugs: Secondary | ICD-10-CM

## 2023-06-23 DIAGNOSIS — R42 Dizziness and giddiness: Secondary | ICD-10-CM

## 2023-06-23 DIAGNOSIS — I10 Essential (primary) hypertension: Secondary | ICD-10-CM

## 2023-06-23 DIAGNOSIS — Z6833 Body mass index (BMI) 33.0-33.9, adult: Secondary | ICD-10-CM | POA: Diagnosis not present

## 2023-06-23 DIAGNOSIS — E669 Obesity, unspecified: Secondary | ICD-10-CM | POA: Diagnosis not present

## 2023-06-23 DIAGNOSIS — N3281 Overactive bladder: Secondary | ICD-10-CM

## 2023-06-23 DIAGNOSIS — Z0000 Encounter for general adult medical examination without abnormal findings: Secondary | ICD-10-CM | POA: Diagnosis not present

## 2023-06-23 DIAGNOSIS — R252 Cramp and spasm: Secondary | ICD-10-CM

## 2023-06-23 MED ORDER — OXYBUTYNIN CHLORIDE ER 5 MG PO TB24: 5.0000 mg | ORAL_TABLET | Freq: Every day | ORAL | 2 refills | Status: DC

## 2023-06-23 MED ORDER — AMLODIPINE BESYLATE 5 MG PO TABS: 5.0000 mg | ORAL_TABLET | Freq: Every day | ORAL | 1 refills | Status: DC

## 2023-06-23 MED ORDER — DULOXETINE HCL 30 MG PO CPEP: 30.0000 mg | ORAL_CAPSULE | Freq: Every day | ORAL | 3 refills | Status: DC

## 2023-06-23 NOTE — Progress Notes (Signed)
Complete physical exam  Patient: Mackenzie Bailey   DOB: Jun 08, 1966   57 y.o. Female  MRN: [id number]  Subjective:    Chief Complaint  Patient presents with   Annual Exam    Mackenzie Bailey is a 57 y.o. female who presents today for a complete physical exam. She reports consuming a general diet.  She generally feels well. She reports sleeping poorly. She does have additional problems to discuss today.    Discussed the use of AI scribe software for clinical note transcription with the patient, who gave verbal consent to proceed.  History of Present Illness   The patient, with a history of fibromyalgia, hypertension, and gallbladder removal, presents with multiple complaints. The chief complaint is constant vertigo and dizziness, which is particularly severe when lying down or watching moving images on TV. The patient also reports shortness of breath and muscle weakness, particularly when climbing stairs. She describes episodes where her legs feel like they don't want to work, and her body doesn't want to cooperate.  The patient also experiences lower back and hip pain on the right side, as well as foot cramps, which she describes as severe. She reports frequent urination, getting up to pee at least five times a night, sometimes more. This has led to instances of urge incontinence, where she doesn't make it to the bathroom in time, and stress incontinence, particularly when coughing or sneezing. The patient also reports fatigue, which she attributes to the frequent urination disrupting her sleep. She also mentions memory issues, which she finds embarrassing, particularly when she loses her train of thought during conversations.  The patient also reports bowel issues, with instances of diarrhea despite being on medication following gallbladder removal. She also mentions occasional constipation. She has noticed that her blood pressure has been low recently, which is unusual for her,  and she has experienced episodes of lightheadedness and balance issues.       Most recent fall risk assessment:    06/23/2023    1:31 PM  Fall Risk   Falls in the past year? 0  Number falls in past yr: 0  Injury with Fall? 0  Risk for fall due to : No Fall Risks  Follow up Falls evaluation completed     Most recent depression screenings:    06/23/2023    1:31 PM 10/07/2022    1:49 PM  PHQ 2/9 Scores  PHQ - 2 Score 0 0  PHQ- 9 Score 10 7        Patient Care Team: Erasmo Downer, MD as PCP - General (Family Medicine)   Outpatient Medications Prior to Visit  Medication Sig   albuterol (PROVENTIL) (2.5 MG/3ML) 0.083% nebulizer solution Take 3 mLs (2.5 mg total) by nebulization every 6 (six) hours as needed for wheezing or shortness of breath.   ALPRAZolam (XANAX) 1 MG tablet TAKE 1 TABLET BY MOUTH DAILY AS NEEDED FOR ANXIETY   buPROPion (WELLBUTRIN XL) 150 MG 24 hr tablet Take 1 tablet (150 mg total) by mouth daily.   colesevelam (WELCHOL) 625 MG tablet Take 625 mg by mouth.   cyclobenzaprine (FLEXERIL) 5 MG tablet Take 1 tablet (5 mg total) by mouth 3 (three) times daily as needed for muscle spasms.   diphenhydramine-acetaminophen (TYLENOL PM) 25-500 MG TABS tablet Take 1-2 tablets by mouth at bedtime as needed (sleep).   esomeprazole (NEXIUM) 20 MG capsule Take 20 mg by mouth daily as needed (heart burn).   hydrocortisone 2.5 % cream  Apply twice daily to affected areas of rash for up to 2 weeks as needed for rash. Avoid applying to face, groin, and axilla. Use as directed. Long-term use can cause thinning of the skin.   ipratropium-albuterol (DUONEB) 0.5-2.5 (3) MG/3ML SOLN Take 3 mLs by nebulization every 6 (six) hours as needed.   ketoconazole (NIZORAL) 2 % shampoo APPLY TO AFFECTED AREA DAILY. LET STAY ON SKIN FOR 5 MINUTES BEFORE WASHING OFF   meclizine (ANTIVERT) 25 MG tablet Take 1 tablet (25 mg total) by mouth 3 (three) times daily as needed for dizziness.    montelukast (SINGULAIR) 10 MG tablet Take 1 tablet (10 mg total) by mouth at bedtime.   mupirocin ointment (BACTROBAN) 2 % Apply 1 application topically daily. With dressing changes   ondansetron (ZOFRAN) 4 MG tablet Take 1 tablet (4 mg total) by mouth every 8 (eight) hours as needed for nausea or vomiting.   Pseudoeph-Doxylamine-DM-APAP (NYQUIL PO) Take 1 Dose by mouth at bedtime as needed (sleep).   traZODone (DESYREL) 50 MG tablet Take 0.5-1 tablets (25-50 mg total) by mouth at bedtime as needed. for sleep   tretinoin (RETIN-A) 0.025 % cream APPLY TO AFFECTED AREA EVERY DAY AT BEDTIME   valACYclovir (VALTREX) 500 MG tablet Take twice daily for 7 days starting 24-36 hours prior to peel   Vitamin D, Ergocalciferol, (DRISDOL) 1.25 MG (50000 UNIT) CAPS capsule Take 1 capsule (50,000 Units total) by mouth every 7 (seven) days.   [DISCONTINUED] amLODipine (NORVASC) 10 MG tablet Take 1 tablet (10 mg total) by mouth daily.   [DISCONTINUED] sertraline (ZOLOFT) 100 MG tablet Take 1.5 tablets (150 mg total) by mouth daily.   No facility-administered medications prior to visit.    ROS per HPI     Objective:     BP (!) 89/45 (BP Location: Left Arm, Patient Position: Sitting, Cuff Size: Large)   Pulse 79   Temp 98.1 F (36.7 C) (Temporal)   Resp 12   Ht 5\' 4"  (1.626 m)   Wt 194 lb 3.2 oz (88.1 kg)   LMP 05/09/2019 (Exact Date)   SpO2 98%   BMI 33.33 kg/m    Physical Exam Vitals reviewed.  Constitutional:      General: She is not in acute distress.    Appearance: Normal appearance. She is well-developed. She is not diaphoretic.  HENT:     Head: Normocephalic and atraumatic.     Right Ear: Tympanic membrane, ear canal and external ear normal.     Left Ear: Tympanic membrane, ear canal and external ear normal.     Nose: Nose normal.     Mouth/Throat:     Mouth: Mucous membranes are moist.     Pharynx: Oropharynx is clear. No oropharyngeal exudate.  Eyes:     General: No scleral  icterus.    Conjunctiva/sclera: Conjunctivae normal.     Pupils: Pupils are equal, round, and reactive to light.  Neck:     Thyroid: No thyromegaly.  Cardiovascular:     Rate and Rhythm: Normal rate and regular rhythm.     Heart sounds: Normal heart sounds. No murmur heard. Pulmonary:     Effort: Pulmonary effort is normal. No respiratory distress.     Breath sounds: Normal breath sounds. No wheezing or rales.  Abdominal:     General: There is no distension.     Palpations: Abdomen is soft.     Tenderness: There is no abdominal tenderness.  Musculoskeletal:  General: No deformity.     Cervical back: Neck supple.     Right lower leg: No edema.     Left lower leg: No edema.  Lymphadenopathy:     Cervical: No cervical adenopathy.  Skin:    General: Skin is warm and dry.     Findings: No rash.  Neurological:     Mental Status: She is alert and oriented to person, place, and time. Mental status is at baseline.     Gait: Gait normal.  Psychiatric:        Mood and Affect: Mood normal.        Behavior: Behavior normal.        Thought Content: Thought content normal.      No results found for any visits on 06/23/23.     Assessment & Plan:    Routine Health Maintenance and Physical Exam  Immunization History  Administered Date(s) Administered   Influenza,inj,Quad PF,6+ Mos 08/13/2013   Influenza-Unspecified 08/16/2018, 07/20/2019, 07/21/2022   Moderna Sars-Covid-2 Vaccination 10/29/2019, 11/26/2019   Pfizer Covid-19 Vaccine Bivalent Booster 43yrs & up 07/09/2021   Tdap 01/14/2020   Zoster Recombinant(Shingrix) 04/07/2021, 10/20/2021, 01/07/2023, 04/29/2023    Health Maintenance  Topic Date Due   Colonoscopy  02/25/2023   COVID-19 Vaccine (4 - 2023-24 season) 06/19/2023   INFLUENZA VACCINE  01/16/2024 (Originally 05/19/2023)   MAMMOGRAM  01/13/2025   DTaP/Tdap/Td (2 - Td or Tdap) 01/13/2030   Hepatitis C Screening  Completed   HIV Screening  Completed   Zoster  Vaccines- Shingrix  Completed   HPV VACCINES  Aged Out    Discussed health benefits of physical activity, and encouraged her to engage in regular exercise appropriate for her age and condition.  Problem List Items Addressed This Visit       Cardiovascular and Mediastinum   Essential hypertension   Relevant Medications   amLODipine (NORVASC) 5 MG tablet   Other Relevant Orders   CBC with Differential/Platelet   Hemoglobin A1c   Comprehensive metabolic panel   Lipid panel     Other   Vertigo   Relevant Orders   Ambulatory referral to ENT   Obesity   Relevant Orders   CBC with Differential/Platelet   Hemoglobin A1c   Comprehensive metabolic panel   Lipid panel   Excessive daytime sleepiness   Relevant Orders   TSH   CBC with Differential/Platelet   Hemoglobin A1c   Avitaminosis D   Relevant Orders   VITAMIN D 25 Hydroxy (Vit-D Deficiency, Fractures)   Chronic fatigue   Relevant Orders   TSH   CBC with Differential/Platelet   Hemoglobin A1c   Other Visit Diagnoses     Encounter for annual physical exam    -  Primary   Relevant Orders   TSH   CBC with Differential/Platelet   Hemoglobin A1c   Comprehensive metabolic panel   Lipid panel   VITAMIN D 25 Hydroxy (Vit-D Deficiency, Fractures)   OAB (overactive bladder)       Nocturnal foot cramps       Relevant Orders   Comprehensive metabolic panel   Hypotension due to drugs       Relevant Medications   amLODipine (NORVASC) 5 MG tablet          Urge Incontinence, OAB, Nocturia New onset of frequent nocturia and urge incontinence. History of stress incontinence. -Start Oxybutynin for overactive bladder. -Advise lifestyle modifications including limiting caffeine and water intake after lunch and dinner respectively.  Fibromyalgia  Chronic muscle pain, fatigue, and sleep disturbances. Discussed the role of exercise and potential benefit of Duloxetine. -Transition from Sertraline to Duloxetine for potential  dual benefit on mood and fibromyalgia symptoms.  Vertigo Persistent vertigo despite Meclizine and maneuvers. -Refer to ENT for further evaluation and management.  Hypertension Low blood pressure readings and occasional lightheadedness. -Reduce Amlodipine dose to 5mg  daily. -Advise patient to monitor blood pressure at home.  Muscle Cramps New onset of foot cramps. -Check electrolytes in lab workup. -Consider Magnesium supplementation if related to electrolyte imbalance.  General Health Maintenance -Order labs including thyroid function, blood counts, A1C, kidney and liver function, electrolytes, cholesterol, and Vitamin D. -Advise patient on colonoscopy due in 2028 and mammogram due in March 2025. -Follow-up in 4-6 weeks to reassess after medication changes.      Return in about 6 weeks (around 08/04/2023) for chronic disease f/u.     Shirlee Latch, MD

## 2023-06-24 LAB — COMPREHENSIVE METABOLIC PANEL
ALT: 27 IU/L (ref 0–32)
AST: 25 IU/L (ref 0–40)
Albumin: 4.2 g/dL (ref 3.8–4.9)
Alkaline Phosphatase: 104 IU/L (ref 44–121)
BUN/Creatinine Ratio: 13 (ref 9–23)
BUN: 9 mg/dL (ref 6–24)
Bilirubin Total: 0.3 mg/dL (ref 0.0–1.2)
CO2: 23 mmol/L (ref 20–29)
Calcium: 9.7 mg/dL (ref 8.7–10.2)
Chloride: 101 mmol/L (ref 96–106)
Creatinine, Ser: 0.67 mg/dL (ref 0.57–1.00)
Globulin, Total: 2.4 g/dL (ref 1.5–4.5)
Glucose: 96 mg/dL (ref 70–99)
Potassium: 3.8 mmol/L (ref 3.5–5.2)
Sodium: 140 mmol/L (ref 134–144)
Total Protein: 6.6 g/dL (ref 6.0–8.5)
eGFR: 103 mL/min/{1.73_m2} (ref >59)

## 2023-06-24 LAB — CBC WITH DIFFERENTIAL/PLATELET
Basophils Absolute: 0 10*3/uL (ref 0.0–0.2)
Basos: 0 %
EOS (ABSOLUTE): 0.1 10*3/uL (ref 0.0–0.4)
Eos: 1 %
Hematocrit: 42.1 % (ref 34.0–46.6)
Hemoglobin: 13.2 g/dL (ref 11.1–15.9)
Immature Grans (Abs): 0 10*3/uL (ref 0.0–0.1)
Immature Granulocytes: 0 %
Lymphocytes Absolute: 2.2 10*3/uL (ref 0.7–3.1)
Lymphs: 32 %
MCH: 25.8 pg — ABNORMAL LOW (ref 26.6–33.0)
MCHC: 31.4 g/dL — ABNORMAL LOW (ref 31.5–35.7)
MCV: 82 fL (ref 79–97)
Monocytes Absolute: 0.4 10*3/uL (ref 0.1–0.9)
Monocytes: 5 %
Neutrophils Absolute: 4.3 10*3/uL (ref 1.4–7.0)
Neutrophils: 62 %
Platelets: 259 10*3/uL (ref 150–450)
RBC: 5.11 x10E6/uL (ref 3.77–5.28)
RDW: 15.2 % (ref 11.7–15.4)
WBC: 7.1 10*3/uL (ref 3.4–10.8)

## 2023-06-24 LAB — VITAMIN D 25 HYDROXY (VIT D DEFICIENCY, FRACTURES): Vit D, 25-Hydroxy: 35.9 ng/mL (ref 30.0–100.0)

## 2023-06-24 LAB — TSH: TSH: 1.56 u[IU]/mL (ref 0.450–4.500)

## 2023-06-24 LAB — LIPID PANEL
Chol/HDL Ratio: 2.4 ratio (ref 0.0–4.4)
Cholesterol, Total: 199 mg/dL (ref 100–199)
HDL: 84 mg/dL (ref >39)
LDL Chol Calc (NIH): 99 mg/dL (ref 0–99)
Triglycerides: 91 mg/dL (ref 0–149)
VLDL Cholesterol Cal: 16 mg/dL (ref 5–40)

## 2023-06-24 LAB — HEMOGLOBIN A1C
Est. average glucose Bld gHb Est-mCnc: 117 mg/dL
Hgb A1c MFr Bld: 5.7 % — ABNORMAL HIGH (ref 4.8–5.6)

## 2023-06-27 ENCOUNTER — Other Ambulatory Visit: Payer: Self-pay | Admitting: Family Medicine

## 2023-07-12 ENCOUNTER — Encounter: Payer: Self-pay | Admitting: Physician Assistant

## 2023-07-12 ENCOUNTER — Ambulatory Visit (INDEPENDENT_AMBULATORY_CARE_PROVIDER_SITE_OTHER): Payer: Commercial Managed Care - PPO | Admitting: Physician Assistant

## 2023-07-12 ENCOUNTER — Ambulatory Visit
Admission: RE | Admit: 2023-07-12 | Discharge: 2023-07-12 | Disposition: A | Discharge status: Home / Self Care | Payer: Commercial Managed Care - PPO | Attending: Physician Assistant | Admitting: Physician Assistant

## 2023-07-12 ENCOUNTER — Ambulatory Visit
Admission: RE | Admit: 2023-07-12 | Discharge: 2023-07-12 | Disposition: A | Discharge status: Home / Self Care | Payer: Commercial Managed Care - PPO | Source: Ambulatory Visit | Attending: Physician Assistant | Admitting: Physician Assistant

## 2023-07-12 VITALS — BP 138/78 | HR 90 | Ht 64.0 in | Wt 186.4 lb

## 2023-07-12 DIAGNOSIS — J45901 Unspecified asthma with (acute) exacerbation: Secondary | ICD-10-CM

## 2023-07-12 DIAGNOSIS — U099 Post covid-19 condition, unspecified: Secondary | ICD-10-CM

## 2023-07-12 DIAGNOSIS — R051 Acute cough: Secondary | ICD-10-CM | POA: Diagnosis not present

## 2023-07-12 MED ORDER — BENZONATATE 100 MG PO CAPS
100.0000 mg | ORAL_CAPSULE | Freq: Two times a day (BID) | ORAL | 0 refills | Status: DC | PRN
Start: 2023-07-12 — End: 2023-07-25

## 2023-07-12 NOTE — Progress Notes (Signed)
Established patient visit  Patient: Mackenzie Bailey   DOB: 07-Jul-1966   57 y.o. Female  MRN: [id number] Visit Date: 07/12/2023  Today's healthcare provider: Debera Lat, PA-C   Chief Complaint  Patient presents with   Cough    Post covid, cough has become worse, can feel pain in the front of chest as well as back when coughing , states it hurts to breath       Subjective     Discussed the use of AI scribe software for clinical note transcription with the patient, who gave verbal consent to proceed.  History of Present Illness   The patient, with a history of asthma, presents with wheezing and a cough that is worse at night. They describe the sensation of wheezing in both their back and front. They have not taken any medication for the cough, but have been using a nebulizer, which they report is not currently effective. They also report a feeling of chest tightness and back congestion, which they describe as unlike anything they have previously experienced. The patient's asthma is typically seasonal and has been well-managed in recent years, but they note that they have had to use their nebulizer more frequently since their recent COVID-19 infection. They also report that they have not had a fever recently, but have woken up feeling clammy. The patient is due to return to work soon, but expresses concern about lingering symptoms.           06/23/2023    1:31 PM 10/07/2022    1:49 PM 06/17/2022    2:57 PM  Depression screen PHQ 2/9  Decreased Interest 0 0 0  Down, Depressed, Hopeless 0 0 0  PHQ - 2 Score 0 0 0  Altered sleeping 3 2 3   Tired, decreased energy 3 3 2   Change in appetite 2 2 2   Feeling bad or failure about yourself  0 0 0  Trouble concentrating 2 0 0  Moving slowly or fidgety/restless 0 0 0  Suicidal thoughts 0 0 0  PHQ-9 Score 10 7 7   Difficult doing work/chores Somewhat difficult Not difficult at all Not difficult at all      06/23/2023    1:31 PM  10/07/2022    1:47 PM 10/20/2021    9:51 AM 02/18/2020    8:11 AM  GAD 7 : Generalized Anxiety Score  Nervous, Anxious, on Edge 1 1 0 1  Control/stop worrying 1 1 1 1   Worry too much - different things 1 2 1 1   Trouble relaxing 0 0 1 0  Restless 0 0 0 0  Easily annoyed or irritable 0 1 0 1  Afraid - awful might happen 0 1 1 0  Total GAD 7 Score 3 6 4 4   Anxiety Difficulty Somewhat difficult Somewhat difficult Somewhat difficult Not difficult at all    Medications: Outpatient Medications Prior to Visit  Medication Sig   albuterol (PROVENTIL) (2.5 MG/3ML) 0.083% nebulizer solution Take 3 mLs (2.5 mg total) by nebulization every 6 (six) hours as needed for wheezing or shortness of breath.   ALPRAZolam (XANAX) 1 MG tablet TAKE 1 TABLET BY MOUTH DAILY AS NEEDED FOR ANXIETY   amLODipine (NORVASC) 5 MG tablet Take 1 tablet (5 mg total) by mouth daily.   buPROPion (WELLBUTRIN XL) 150 MG 24 hr tablet Take 1 tablet (150 mg total) by mouth daily.   colesevelam (WELCHOL) 625 MG tablet Take 625 mg by mouth.   cyclobenzaprine (FLEXERIL) 5 MG tablet Take  1 tablet (5 mg total) by mouth 3 (three) times daily as needed for muscle spasms.   diphenhydramine-acetaminophen (TYLENOL PM) 25-500 MG TABS tablet Take 1-2 tablets by mouth at bedtime as needed (sleep).   DULoxetine (CYMBALTA) 30 MG capsule Take 1 capsule (30 mg total) by mouth daily.   esomeprazole (NEXIUM) 20 MG capsule Take 20 mg by mouth daily as needed (heart burn).   hydrocortisone 2.5 % cream Apply twice daily to affected areas of rash for up to 2 weeks as needed for rash. Avoid applying to face, groin, and axilla. Use as directed. Long-term use can cause thinning of the skin.   ipratropium-albuterol (DUONEB) 0.5-2.5 (3) MG/3ML SOLN Take 3 mLs by nebulization every 6 (six) hours as needed.   ketoconazole (NIZORAL) 2 % shampoo APPLY TO AFFECTED AREA DAILY. LET STAY ON SKIN FOR 5 MINUTES BEFORE WASHING OFF   meclizine (ANTIVERT) 25 MG tablet Take  1 tablet (25 mg total) by mouth 3 (three) times daily as needed for dizziness.   montelukast (SINGULAIR) 10 MG tablet Take 1 tablet (10 mg total) by mouth at bedtime.   mupirocin ointment (BACTROBAN) 2 % Apply 1 application topically daily. With dressing changes   ondansetron (ZOFRAN) 4 MG tablet Take 1 tablet (4 mg total) by mouth every 8 (eight) hours as needed for nausea or vomiting.   oxybutynin (DITROPAN-XL) 5 MG 24 hr tablet TAKE 1 TABLET BY MOUTH EVERYDAY AT BEDTIME   Pseudoeph-Doxylamine-DM-APAP (NYQUIL PO) Take 1 Dose by mouth at bedtime as needed (sleep).   traZODone (DESYREL) 50 MG tablet Take 0.5-1 tablets (25-50 mg total) by mouth at bedtime as needed. for sleep   tretinoin (RETIN-A) 0.025 % cream APPLY TO AFFECTED AREA EVERY DAY AT BEDTIME   valACYclovir (VALTREX) 500 MG tablet Take twice daily for 7 days starting 24-36 hours prior to peel   Vitamin D, Ergocalciferol, (DRISDOL) 1.25 MG (50000 UNIT) CAPS capsule Take 1 capsule (50,000 Units total) by mouth every 7 (seven) days.   No facility-administered medications prior to visit.    Review of Systems  All other systems reviewed and are negative.  Except see HPI       Objective    BP 138/78 (BP Location: Left Arm, Patient Position: Sitting, Cuff Size: Large)   Pulse 90   Ht 5\' 4"  (1.626 m)   Wt 186 lb 6.4 oz (84.6 kg)   LMP 05/09/2019 (Exact Date)   SpO2 98%   BMI 32.00 kg/m     Physical Exam Vitals reviewed.  Constitutional:      General: She is not in acute distress.    Appearance: Normal appearance. She is well-developed. She is not diaphoretic.  HENT:     Head: Normocephalic and atraumatic.     Right Ear: Ear canal and external ear normal.     Left Ear: Ear canal and external ear normal.     Ears:     Comments: Fluids behind TMs    Nose: Congestion and rhinorrhea present.     Mouth/Throat:     Comments: Can not be visualize due to gag reflex Eyes:     General: No scleral icterus.       Right eye: No  discharge.        Left eye: No discharge.     Extraocular Movements: Extraocular movements intact.     Conjunctiva/sclera: Conjunctivae normal.     Pupils: Pupils are equal, round, and reactive to light.  Neck:     Thyroid:  No thyromegaly.  Cardiovascular:     Rate and Rhythm: Normal rate and regular rhythm.     Pulses: Normal pulses.     Heart sounds: Normal heart sounds. No murmur heard. Pulmonary:     Effort: Pulmonary effort is normal. No respiratory distress.     Breath sounds: Normal breath sounds. No wheezing, rhonchi or rales.  Musculoskeletal:     Cervical back: Normal range of motion and neck supple. No tenderness.     Right lower leg: No edema.     Left lower leg: No edema.  Lymphadenopathy:     Cervical: Cervical adenopathy present.  Skin:    General: Skin is warm and dry.     Findings: No rash.  Neurological:     Mental Status: She is alert and oriented to person, place, and time. Mental status is at baseline.  Psychiatric:        Mood and Affect: Mood normal.        Behavior: Behavior normal.      No results found for any visits on 07/12/23.  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 Asthma typically seasonal, but exacerbated by recent COVID-19 infection. Nebulizer not providing relief. -Order chest X-ray to rule out pneumonia. -Consider starting prednisone if X-ray does not confirm pneumonia. -Recommend nasal saline rinse and Flonase for congestion. Continue her current OTC cough medication, Rx for tessalon perles was placed. Will rx zpack vs presnisone depending on xr results  Return to Work Patient is due to return to work on Thursday, but symptoms persit. -Advise patient to delay return to work until the following Monday to allow for further recovery.  Cough  Management Persistent cough, particularly at night.  Tessalon Perles and Nyquil have provided some relief in the past. -Consider prescribing Tessalon Perles for cough if needed.      No follow-ups on file.     The patient was advised to call back or seek an in-person evaluation if the symptoms worsen or if the condition fails to improve as anticipated.  I discussed the assessment and treatment plan with the patient. The patient was provided an opportunity to ask questions and all were answered. The patient agreed with the plan and demonstrated an understanding of the instructions.  I, Debera Lat, PA-C have reviewed all documentation for this visit. The documentation on 07/12/23  for the exam, diagnosis, procedures, and orders are all accurate and complete.  Debera Lat, [facility], MMS [facility] [phone] (phone) [phone] (fax)  [facility]

## 2023-07-13 ENCOUNTER — Telehealth: Payer: Self-pay

## 2023-07-13 DIAGNOSIS — J45901 Unspecified asthma with (acute) exacerbation: Secondary | ICD-10-CM

## 2023-07-13 NOTE — Telephone Encounter (Signed)
Patient was advised xray results are not back.  She would like to be call once we know the results. She also asked about the Prednisone prescription? Reports that the antibiotic is going to be call in depending on the chest xray results. But she thought Prednisone send in.  Please review and advise.

## 2023-07-13 NOTE — Telephone Encounter (Signed)
Pt is calling in requesting a nurse give her a call back regarding her Xray results. Pt says she hasn't received them yet and wants to speak with someone as soon as possible. Please follow up with pt.

## 2023-07-13 NOTE — Telephone Encounter (Signed)
Copied from CRM [id number]. Topic: General - Inquiry >> Jul 13, 2023  9:16 AM Lennox Pippins wrote: Patient has called and would like a call back regarding xray results & to discuss medication that was prescribed at well.  Please contact patient back @ # [phone]

## 2023-07-14 ENCOUNTER — Encounter: Payer: Self-pay | Admitting: Physician Assistant

## 2023-07-14 ENCOUNTER — Telehealth: Payer: Self-pay

## 2023-07-14 ENCOUNTER — Other Ambulatory Visit: Payer: Self-pay | Admitting: Physician Assistant

## 2023-07-14 ENCOUNTER — Ambulatory Visit: Payer: Self-pay

## 2023-07-14 DIAGNOSIS — J45901 Unspecified asthma with (acute) exacerbation: Secondary | ICD-10-CM

## 2023-07-14 MED ORDER — AZITHROMYCIN 250 MG PO TABS
ORAL_TABLET | ORAL | 0 refills | Status: AC
Start: 2023-07-14 — End: 2023-07-19

## 2023-07-14 MED ORDER — PREDNISONE 10 MG PO TABS
10.0000 mg | ORAL_TABLET | Freq: Two times a day (BID) | ORAL | 0 refills | Status: DC
Start: 2023-07-14 — End: 2023-07-25

## 2023-07-14 NOTE — Telephone Encounter (Signed)
Pt called in for chest xray results. Please cb when results are in

## 2023-07-14 NOTE — Telephone Encounter (Signed)
  Chief Complaint: SOB Symptoms: SOB cough with chest congestion, chest and back discomfort, fatigue  Frequency: 2 weeks  Pertinent Negatives: Pt feels sx have gotten worse  Disposition: [] ED /[] Urgent Care (no appt availability in office) / [] Appointment(In office/virtual)/ []  [place] [facility]/ [] Home Care/ [] Refused Recommended Disposition /[]  Mobile Bus/ [x]  Follow-up with PCP Additional Notes: pt is still waiting for CXR results to be given, pt feels sx have gotten worse. Says the Tessalon perles are not helping and at this time she is requesting steroid and abx to be sent in if possible. She would like to return to work on Monday but feels if she has to wait until XR results come back she will be in the hospital by then. Advised pt I would send message back to provider for review and have nurse FU since she had OV on 07/12/23.   Reason for Disposition  [1] MILD difficulty breathing (e.g., minimal/no SOB at rest, SOB with walking, pulse <100) AND [2] NEW-onset or WORSE than normal  Answer Assessment - Initial Assessment Questions 1. RESPIRATORY STATUS: "Describe your breathing?" (e.g., wheezing, shortness of breath, unable to speak, severe coughing)      SOB and cough  2. ONSET: "When did this breathing problem begin?"      2 weeks noted  3. PATTERN "Does the difficult breathing come and go, or has it been constant since it started?"      Constant  4. SEVERITY: "How bad is your breathing?" (e.g., mild, moderate, severe)    - MILD: No SOB at rest, mild SOB with walking, speaks normally in sentences, can lie down, no retractions, pulse < 100.    - MODERATE: SOB at rest, SOB with minimal exertion and prefers to sit, cannot lie down flat, speaks in phrases, mild retractions, audible wheezing, pulse 100-120.    - SEVERE: Very SOB at rest, speaks in single words, struggling to breathe, sitting hunched forward, retractions, pulse > 120      Moderate  8. CAUSE: "What do you think  is causing the breathing problem?"      Post covid  9. OTHER SYMPTOMS: "Do you have any other symptoms? (e.g., dizziness, runny nose, cough, chest pain, fever)     Cough with congestion, chest discomfort and back discomfort  Protocols used: Breathing Difficulty-A-AH

## 2023-07-14 NOTE — Telephone Encounter (Signed)
Provider has not reviewed or commented on the chest X-ray yet. Patient was notified, will call when results are ready.

## 2023-07-19 ENCOUNTER — Other Ambulatory Visit: Payer: Self-pay | Admitting: Family Medicine

## 2023-07-19 DIAGNOSIS — I10 Essential (primary) hypertension: Secondary | ICD-10-CM

## 2023-07-20 NOTE — Telephone Encounter (Signed)
Requested Prescriptions  Pending Prescriptions Disp Refills   DULoxetine (CYMBALTA) 30 MG capsule [Pharmacy Med Name: DULOXETINE HCL DR 30 MG CAP] 90 capsule 2    Sig: TAKE 1 CAPSULE BY MOUTH EVERY DAY     Psychiatry: Antidepressants - SNRI - duloxetine Passed - 07/19/2023  2:31 PM      Passed - Cr in normal range and within 360 days    Creatinine, Ser  Date Value Ref Range Status  06/23/2023 0.67 0.57 - 1.00 mg/dL Final         Passed - eGFR is 30 or above and within 360 days    GFR calc Af Amer  Date Value Ref Range Status  07/18/2020 120 >59 mL/min/1.73 Final    Comment:    **[facility] currently reports eGFR in compliance with the current**   recommendations of the [employer]. [facility] will   update reporting as new guidelines are published from the NKF-ASN   Task force.    GFR calc non Af Amer  Date Value Ref Range Status  07/18/2020 104 >59 mL/min/1.73 Final   eGFR  Date Value Ref Range Status  06/23/2023 103 >59 mL/min/1.73 Final         Passed - Completed PHQ-2 or PHQ-9 in the last 360 days      Passed - Last BP in normal range    BP Readings from Last 1 Encounters:  07/12/23 138/78         Passed - Valid encounter within last 6 months    Recent Outpatient Visits           1 week ago Exacerbation of persistent asthma, unspecified asthma severity   [place] [facility] [place], [place], PA-C   3 weeks ago Encounter for annual physical exam   [place] [facility] [place], Marzella Schlein, MD   9 months ago Essential hypertension   [place] [facility] [place] [facility], Marzella Schlein, MD   1 year ago Annual physical exam   [place] [facility] [place], [place], PA-C   1 year ago Chronic diarrhea   [place] [facility] [place], [place], PA-C       Future Appointments             In 5 days Bacigalupo, Marzella Schlein, MD [place] [facility], [facility]              amLODipine (NORVASC) 5 MG tablet [Pharmacy Med Name: AMLODIPINE BESYLATE 5 MG TAB] 90 tablet 1    Sig: TAKE 1 TABLET (5 MG TOTAL) BY MOUTH DAILY.     Cardiovascular: Calcium Channel Blockers 2 Passed - 07/19/2023  2:31 PM      Passed - Last BP in normal range    BP Readings from Last 1 Encounters:  07/12/23 138/78         Passed - Last Heart Rate in normal range    Pulse Readings from Last 1 Encounters:  07/12/23 90         Passed - Valid encounter within last 6 months    Recent Outpatient Visits           1 week ago Exacerbation of persistent asthma, unspecified asthma severity   [place] [facility] [place], [place], PA-C   3 weeks ago Encounter for annual physical exam   [place] [facility] [place], Marzella Schlein, MD   9 months ago Essential hypertension   [place] [facility]  Erasmo Downer, MD   1 year ago Annual physical exam   [place] [facility] [place], [place], [place]   1 year ago Chronic diarrhea   [place] [facility] [place], [place], PA-C       Future Appointments             In 5 days Bacigalupo, Marzella Schlein, MD [facility], [facility]

## 2023-07-25 ENCOUNTER — Ambulatory Visit (INDEPENDENT_AMBULATORY_CARE_PROVIDER_SITE_OTHER): Payer: Commercial Managed Care - PPO | Admitting: Family Medicine

## 2023-07-25 ENCOUNTER — Encounter: Payer: Self-pay | Admitting: Family Medicine

## 2023-07-25 VITALS — BP 120/70 | HR 83 | Resp 20 | Ht 64.0 in | Wt 187.2 lb

## 2023-07-25 DIAGNOSIS — F419 Anxiety disorder, unspecified: Secondary | ICD-10-CM | POA: Diagnosis not present

## 2023-07-25 DIAGNOSIS — N3281 Overactive bladder: Secondary | ICD-10-CM | POA: Diagnosis not present

## 2023-07-25 DIAGNOSIS — R5382 Chronic fatigue, unspecified: Secondary | ICD-10-CM

## 2023-07-25 DIAGNOSIS — M797 Fibromyalgia: Secondary | ICD-10-CM | POA: Insufficient documentation

## 2023-07-25 DIAGNOSIS — I10 Essential (primary) hypertension: Secondary | ICD-10-CM

## 2023-07-25 DIAGNOSIS — M940 Chondrocostal junction syndrome [Tietze]: Secondary | ICD-10-CM

## 2023-07-25 MED ORDER — ALPRAZOLAM 1 MG PO TABS: 1.0000 mg | ORAL_TABLET | Freq: Every day | ORAL | 2 refills | Status: DC | PRN

## 2023-07-25 MED ORDER — DULOXETINE HCL 60 MG PO CPEP: 60.0000 mg | ORAL_CAPSULE | Freq: Every day | ORAL | 1 refills | Status: DC

## 2023-07-25 NOTE — Assessment & Plan Note (Signed)
Successful transition from Sertraline to Duloxetine with noted weight loss and improved mood. Bupropion discontinued due to lack of noticeable effect and patient preference. -Continue Duloxetine, increase dose to 60mg  daily.

## 2023-07-25 NOTE — Progress Notes (Signed)
Established Patient Office Visit  Subjective   Patient ID: Mackenzie Bailey, female    DOB: August 22, 1966  Age: 57 y.o. MRN: [id number]  Chief Complaint  Patient presents with   Medical Management of Chronic Issues    5 week follow-up. Pt was to start  Oxybutynin for overactive bladder, transition from Sertraline to Duloxetine for potential dual benefit on mood and fibromyalgia symptoms and reduce Amlodipine dose to 5mg  daily. Patient reports compliance with changes and states she was taking higher dose while she was covid positive to prevent bp being high due to meds taking. Tolerating changes well with no side effects.     HPI  Discussed the use of AI scribe software for clinical note transcription with the patient, who gave verbal consent to proceed.  History of Present Illness   The patient, with a history of anxiety, fibromyalgia, hypertension, and bladder issues, presents for a follow-up visit. They recently recovered from COVID-19 and have been experiencing some residual symptoms, including fatigue, phlegm, and chest discomfort. The patient reports a weight loss of seven pounds since discontinuing sertraline and switching to duloxetine. They have not been taking bupropion and report no significant changes in mood or energy levels. The patient also reports some improvement in fibromyalgia symptoms with duloxetine but still experiences some aching. They express a desire to increase the dose of duloxetine to further manage these symptoms.  The patient's hypertension is currently managed with amlodipine, and they report some inconsistency in taking the medication due to concerns about potential interactions with over-the-counter medications during their COVID-19 illness. They also report some inconsistency in taking oxybutynin for bladder issues but note an increase in urinary frequency when consuming alcohol.  The patient also discusses a persistent skin condition, which they describe as  looking "dirty." They have been using a prescribed shampoo for this condition but find it difficult to use due to its thick consistency. They express a desire to switch to an over-the-counter product.         ROS    Objective:     BP 120/70 (BP Location: Right Arm, Patient Position: Sitting, Cuff Size: Large)   Pulse 83   Resp 20   Ht 5\' 4"  (1.626 m)   Wt 187 lb 3.2 oz (84.9 kg)   LMP 05/09/2019 (Exact Date)   SpO2 97%   BMI 32.13 kg/m    Physical Exam Vitals reviewed.  Constitutional:      General: She is not in acute distress.    Appearance: Normal appearance. She is well-developed. She is not diaphoretic.  HENT:     Head: Normocephalic and atraumatic.  Eyes:     General: No scleral icterus.    Conjunctiva/sclera: Conjunctivae normal.  Neck:     Thyroid: No thyromegaly.  Cardiovascular:     Rate and Rhythm: Normal rate and regular rhythm.     Heart sounds: Normal heart sounds. No murmur heard. Pulmonary:     Effort: Pulmonary effort is normal. No respiratory distress.     Breath sounds: Normal breath sounds. No wheezing, rhonchi or rales.  Musculoskeletal:     Cervical back: Neck supple.     Right lower leg: No edema.     Left lower leg: No edema.  Lymphadenopathy:     Cervical: No cervical adenopathy.  Skin:    General: Skin is warm and dry.     Findings: No rash.  Neurological:     Mental Status: She is alert and oriented to person,  place, and time. Mental status is at baseline.  Psychiatric:        Mood and Affect: Mood normal.        Behavior: Behavior normal.      No results found for any visits on 07/25/23.    The 10-year ASCVD risk score (Arnett DK, et al., 2019) is: 1.6%    Assessment & Plan:   Problem List Items Addressed This Visit       Cardiovascular and Mediastinum   Essential hypertension - Primary    Blood pressure well controlled on Amlodipine 5mg  daily. -Continue Amlodipine 5mg  daily.        Genitourinary   OAB  (overactive bladder)    Oxybutynin use inconsistent, patient reports increased urinary frequency with alcohol consumption. -Encourage consistent use of Oxybutynin.        Other   Anxiety    Successful transition from Sertraline to Duloxetine with noted weight loss and improved mood. Bupropion discontinued due to lack of noticeable effect and patient preference. -Continue Duloxetine, increase dose to 60mg  daily.      Relevant Medications   DULoxetine (CYMBALTA) 60 MG capsule   ALPRAZolam (XANAX) 1 MG tablet   Chronic fatigue   Fibromyalgia    Some improvement in aching symptoms with Duloxetine. -Increase Duloxetine to 60mg  daily.      Relevant Medications   DULoxetine (CYMBALTA) 60 MG capsule   Other Visit Diagnoses     Costochondritis         After recent URI Recommend NSAIDs prn      Post-Cholecystectomy Diarrhea Controlled with Welchol. -Continue Welchol as prescribed.  General Health Maintenance / Followup Plans -Check blood pressure in 3 months. -Encourage use of over-the-counter Selsun Blue for tinea. -Continue over-the-counter Vitamin D supplementation.        Return in about 3 months (around 10/25/2023) for chronic disease f/u.    Shirlee Latch, MD

## 2023-07-25 NOTE — Assessment & Plan Note (Signed)
Blood pressure well controlled on Amlodipine 5mg  daily. -Continue Amlodipine 5mg  daily.

## 2023-07-25 NOTE — Assessment & Plan Note (Signed)
Some improvement in aching symptoms with Duloxetine. -Increase Duloxetine to 60mg  daily.

## 2023-07-25 NOTE — Assessment & Plan Note (Signed)
Oxybutynin use inconsistent, patient reports increased urinary frequency with alcohol consumption. -Encourage consistent use of Oxybutynin.

## 2023-08-23 ENCOUNTER — Other Ambulatory Visit: Payer: Self-pay | Admitting: Family Medicine

## 2023-08-23 DIAGNOSIS — I10 Essential (primary) hypertension: Secondary | ICD-10-CM

## 2023-10-25 ENCOUNTER — Ambulatory Visit: Payer: Commercial Managed Care - PPO | Admitting: Family Medicine

## 2023-11-01 ENCOUNTER — Other Ambulatory Visit: Payer: Self-pay | Admitting: Family Medicine

## 2023-11-01 DIAGNOSIS — J302 Other seasonal allergic rhinitis: Secondary | ICD-10-CM

## 2023-11-02 NOTE — Telephone Encounter (Signed)
 Requested medication (s) are due for refill today: Yes  Requested medication (s) are on the active medication list: Yes  Last refill:  10/07/22  Future visit scheduled:   Notes to clinic:  Prescriptions have expired.    Requested Prescriptions  Pending Prescriptions Disp Refills   montelukast  (SINGULAIR ) 10 MG tablet [Pharmacy Med Name: MONTELUKAST  SOD 10 MG TABLET] 90 tablet 3    Sig: TAKE 1 TABLET BY MOUTH EVERYDAY AT BEDTIME     Pulmonology:  Leukotriene Inhibitors Passed - 11/02/2023 11:31 AM      Passed - Valid encounter within last 12 months    Recent Outpatient Visits           3 months ago Essential hypertension   [place] [facility] [place], Stan Eans, MD   3 months ago Exacerbation of persistent asthma, unspecified asthma severity   [place] [facility] [place], [place], PA-C   4 months ago Encounter for annual physical exam   [facility] [place], Stan Eans, MD   1 year ago Essential hypertension   [place] [facility] [place], Stan Eans, MD   1 year ago Annual physical exam   [place] [facility] [place], [place], PA-C               traZODone  (DESYREL ) 50 MG tablet [Pharmacy Med Name: TRAZODONE  50 MG TABLET] 90 tablet 3    Sig: Take 0.5-1 tablets (25-50 mg total) by mouth at bedtime as needed. for sleep     Psychiatry: Antidepressants - Serotonin Modulator Passed - 11/02/2023 11:31 AM      Passed - Valid encounter within last 6 months    Recent Outpatient Visits           3 months ago Essential hypertension   [place] [facility] [place], Stan Eans, MD   3 months ago Exacerbation of persistent asthma, unspecified asthma severity   [place] [facility] [place], [place], PA-C   4 months ago Encounter for annual physical exam   [place] [facility] [place], Stan Eans, MD   1 year ago Essential  hypertension   [facility] [facility] [place], Stan Eans, MD   1 year ago Annual physical exam   [place] [facility] [place], Janna, PA-C

## 2023-12-02 ENCOUNTER — Other Ambulatory Visit: Payer: Self-pay | Admitting: Family Medicine

## 2023-12-02 NOTE — Telephone Encounter (Signed)
Requested medication (s) are due for refill today: Yes  Requested medication (s) are on the active medication list: Yes  Last refill:  10/07/22  Future visit scheduled: No  Notes to clinic:  Unable to refill per protocol, cannot delegate.      Requested Prescriptions  Pending Prescriptions Disp Refills   cyclobenzaprine (FLEXERIL) 5 MG tablet [Pharmacy Med Name: CYCLOBENZAPRINE 5 MG TABLET] 30 tablet 1    Sig: TAKE 1 TABLET BY MOUTH THREE TIMES A DAY AS NEEDED FOR MUSCLE SPASMS     Not Delegated - Analgesics:  Muscle Relaxants Failed - 12/02/2023  2:10 PM      Failed - This refill cannot be delegated      Passed - Valid encounter within last 6 months    Recent Outpatient Visits           4 months ago Essential hypertension   [facility] [place], Marzella Schlein, MD   4 months ago Exacerbation of persistent asthma, unspecified asthma severity   [facility] [place], Cecilia, PA-C   5 months ago Encounter for annual physical exam   [facility] [place], Marzella Schlein, MD   1 year ago Essential hypertension   [facility] [place], Marzella Schlein, MD   1 year ago Annual physical exam   [facility] [place], [place], [place]

## 2023-12-05 NOTE — Telephone Encounter (Signed)
 Please review.  KP

## 2023-12-16 ENCOUNTER — Encounter: Payer: Self-pay | Admitting: Family Medicine

## 2023-12-16 ENCOUNTER — Ambulatory Visit: Payer: Commercial Managed Care - PPO | Admitting: Family Medicine

## 2023-12-16 ENCOUNTER — Ambulatory Visit
Admission: RE | Admit: 2023-12-16 | Discharge: 2023-12-16 | Disposition: A | Discharge status: Home / Self Care | Payer: Commercial Managed Care - PPO | Source: Ambulatory Visit | Attending: Family Medicine | Admitting: Family Medicine

## 2023-12-16 VITALS — BP 124/86 | HR 97 | Ht 64.0 in | Wt 203.3 lb

## 2023-12-16 DIAGNOSIS — I10 Essential (primary) hypertension: Secondary | ICD-10-CM | POA: Diagnosis not present

## 2023-12-16 DIAGNOSIS — W19XXXA Unspecified fall, initial encounter: Secondary | ICD-10-CM | POA: Diagnosis not present

## 2023-12-16 DIAGNOSIS — T148XXA Other injury of unspecified body region, initial encounter: Secondary | ICD-10-CM

## 2023-12-16 DIAGNOSIS — S300XXA Contusion of lower back and pelvis, initial encounter: Secondary | ICD-10-CM

## 2023-12-16 NOTE — Progress Notes (Signed)
 Acute visit   Patient: Mackenzie Bailey   DOB: March 19, 1966   58 y.o. Female  MRN: [id number]  Chief Complaint  Patient presents with   Fall    Patient reports falling X 4 weeks ago today due to carrying dog up the steps and lost her step. She fell back on her back and hit everything. Patient reports bruising and pain in back as well as yellow bruising on her arms. She reports taking muscle a muscle relaxant at night and aleve during the day.    Subjective    Discussed the use of AI scribe software for clinical note transcription with the patient, who gave verbal consent to proceed.  History of Present Illness   The patient, with a history of a fall about a month ago, presents with a large, firm, warm hematoma on the right side of the sacrum. She describes the discomfort as a tightness, similar to whiplash, and notes that it is particularly uncomfortable when sitting in certain chairs, such as her car seat. The patient has been managing the pain with muscle relaxers and Aleve, which she reports helps her relax enough to sleep at night. The patient also notes that the hematoma seems to have developed after the initial bruising from the fall, and questions whether a hematoma can develop later.        Review of Systems  Objective    BP 124/86 (BP Location: Right Arm, Patient Position: Sitting, Cuff Size: Normal)   Pulse 97   Ht 5\' 4"  (1.626 m)   Wt 203 lb 4.8 oz (92.2 kg)   LMP 05/09/2019 (Exact Date)   SpO2 98%   BMI 34.90 kg/m  Physical Exam Vitals reviewed.  Constitutional:      General: She is not in acute distress.    Appearance: Normal appearance. She is well-developed. She is not diaphoretic.  HENT:     Head: Normocephalic and atraumatic.  Eyes:     General: No scleral icterus.    Conjunctiva/sclera: Conjunctivae normal.  Neck:     Thyroid: No thyromegaly.  Cardiovascular:     Rate and Rhythm: Normal rate and regular rhythm.     Pulses: Normal pulses.      Heart sounds: Normal heart sounds. No murmur heard. Pulmonary:     Effort: Pulmonary effort is normal. No respiratory distress.     Breath sounds: Normal breath sounds. No wheezing, rhonchi or rales.  Musculoskeletal:     Cervical back: Neck supple.     Right lower leg: No edema.     Left lower leg: No edema.  Lymphadenopathy:     Cervical: No cervical adenopathy.  Skin:    General: Skin is warm and dry.     Findings: No rash.  Neurological:     Mental Status: She is alert and oriented to person, place, and time. Mental status is at baseline.  Psychiatric:        Mood and Affect: Mood normal.        Behavior: Behavior normal.       No results found for any visits on 12/16/23.  Assessment & Plan     Problem List Items Addressed This Visit       Cardiovascular and Mediastinum   Essential hypertension   Other Visit Diagnoses       Fall, initial encounter    -  Primary   Relevant Orders   DG Lumbar Spine Complete   DG Sacrum/Coccyx  Hematoma       Relevant Orders   DG Lumbar Spine Complete   DG Sacrum/Coccyx          Hematoma Large sacral hematoma, approximately football-sized, persisting for four weeks post-fall. Firm, warm, slightly discolored, causing discomfort and throbbing pain, especially when sitting. Explained that resorption will take months and no drainage is planned due to the hematoma's firmness and age. X-rays are needed to rule out underlying fractures. - Order lumbar spine and sacrum x-rays. - Continue Aleve for pain management. - Use a heating pad to promote resorption. - Provide hematoma information via MyChart. - Refer to [facility] for x-rays.  Fall Symptoms include neck and upper back tightness and discomfort, following a fall. Likely due to sudden backward motion and impact. Supportive care is the primary treatment. - Monitor symptoms and provide supportive care.  Follow-up - Review x-ray results promptly. - Develop a treatment plan if  fractures are identified. - Continue supportive care if no fractures are found.         No orders of the defined types were placed in this encounter.    Return if symptoms worsen or fail to improve.      Shirlee Latch, MD  [facility] [phone] (phone) [phone] (fax)  [facility]

## 2023-12-20 ENCOUNTER — Encounter: Payer: Self-pay | Admitting: Family Medicine

## 2024-01-28 ENCOUNTER — Other Ambulatory Visit: Payer: Self-pay | Admitting: Family Medicine

## 2024-01-30 NOTE — Telephone Encounter (Signed)
 Requested Prescriptions  Pending Prescriptions Disp Refills   DULoxetine (CYMBALTA) 60 MG capsule [Pharmacy Med Name: DULOXETINE HCL DR 60 MG CAP] 90 capsule 1    Sig: TAKE 1 CAPSULE BY MOUTH EVERY DAY     Psychiatry: Antidepressants - SNRI - duloxetine Passed - 01/30/2024 11:17 AM      Passed - Cr in normal range and within 360 days    Creatinine, Ser  Date Value Ref Range Status  06/23/2023 0.67 0.57 - 1.00 mg/dL Final         Passed - eGFR is 30 or above and within 360 days    GFR calc Af Amer  Date Value Ref Range Status  07/18/2020 120 >59 mL/min/1.73 Final    Comment:    **[facility] currently reports eGFR in compliance with the current**   recommendations of the [employer]. [facility] will   update reporting as new guidelines are published from the NKF-ASN   Task force.    GFR calc non Af Amer  Date Value Ref Range Status  07/18/2020 104 >59 mL/min/1.73 Final   eGFR  Date Value Ref Range Status  06/23/2023 103 >59 mL/min/1.73 Final         Passed - Completed PHQ-2 or PHQ-9 in the last 360 days      Passed - Last BP in normal range    BP Readings from Last 1 Encounters:  12/16/23 124/86         Passed - Valid encounter within last 6 months    Recent Outpatient Visits           1 month ago Fall, initial encounter   [facility], Stan Eans, MD

## 2024-02-16 ENCOUNTER — Other Ambulatory Visit: Payer: Self-pay | Admitting: Family Medicine

## 2024-02-20 NOTE — Telephone Encounter (Signed)
 Requested medication (s) are due for refill today: yes  Requested medication (s) are on the active medication list: yes  Last refill:  07/25/23  Future visit scheduled: no  Notes to clinic:  Unable to refill per protocol, cannot delegate.      Requested Prescriptions  Pending Prescriptions Disp Refills   ALPRAZolam  (XANAX ) 1 MG tablet [Pharmacy Med Name: ALPRAZOLAM  1 MG TABLET] 30 tablet     Sig: Take 1 tablet (1 mg total) by mouth daily as needed. for anxiety     Not Delegated - Psychiatry: Anxiolytics/Hypnotics 2 Failed - 02/20/2024  8:28 AM      Failed - This refill cannot be delegated      Failed - Urine Drug Screen completed in last 360 days      Passed - Patient is not pregnant      Passed - Valid encounter within last 6 months    Recent Outpatient Visits           2 months ago Fall, initial encounter   [facility], Stan Eans, MD

## 2024-03-07 ENCOUNTER — Ambulatory Visit: Payer: Self-pay

## 2024-03-07 NOTE — Telephone Encounter (Signed)
 Chief Complaint:  Fatigue  Symptoms:  -Fatigue -- Has to put her head down at work intermittently  - Hote flashes: She is menopausal but notes her hot flashes have increased since 2 wks ago   Frequency:  -Onset: two weeks ago    Patient denies  -General weakness, blurred vision, palpitations, s/s of dehydration, chest pain   Disposition: [ ] ED /[ X]Urgent Care (no appt availability in office) / [] Appointment(In office/virtual)/ [ ]  [place] [facility]/ [ ] Home Care/ [ ] Refused Recommended Disposition /[ ] [place] [facility]/ [ ]  Follow-up with PCP   Additional Notes:  Attempted to schedule an appt for pt within disposition timeline, none available. Referred pt to UC, patient declined. Patient educated on pertinent s/s that would warrant emergent help/call 911/ ED/UC.  Patient verbalized understanding. No additional questions/concerns noted during the time of the call.     Complete triage note below:   Copied from CRM [id number]. Topic: Clinical - Red Word Triage >> Mar 07, 2024  1:22 PM [name] wrote: [facility] that prompted transfer to Nurse Triage: extreme tiredness Reason for Disposition  [1] MILD weakness (i.e., does not interfere with ability to work, go to school, normal activities) AND [2] persists > 1 week  Answer Assessment - Initial Assessment Questions 1. DESCRIPTION: "Describe how you are feeling."     -- More tired than usual. Hard to keep her eyes open      2. SEVERITY: "How bad is it?"  "Can you stand and walk?"   - MILD (0-3): Feels weak or tired, but does not interfere with work, school or normal activities.   - MODERATE (4-7): Able to stand and walk; weakness interferes with work, school, or normal activities.   - SEVERE (8-10): Unable to stand or walk; unable to do usual activities.     -------  Mild--- Had to put her head down at work to nap ( she never does this)     3. ONSET: "When did these symptoms begin?" (e.g., hours, days, weeks,  months)     --- two weeks     4. CAUSE: "What do you think is causing the weakness or fatigue?" (e.g., not drinking enough fluids, medical problem, trouble sleeping)    --- Unsure. States that she intermittently has low vitamin D  levels  ( taking OTC)     5. NEW MEDICINES:  "Have you started on any new medicines recently?" (e.g., opioid pain medicines, benzodiazepines, muscle relaxants, antidepressants, antihistamines, neuroleptics, beta blockers)     ------- Denies     6. OTHER SYMPTOMS: "Do you have any other symptoms?" (e.g., chest pain, fever, cough, SOB, vomiting, diarrhea, bleeding, other areas of pain)     ---- Menopausal, has hot flashes. But she has been having more than usual,  Protocols used: Weakness (Generalized) and Fatigue-A-AH

## 2024-03-08 NOTE — Telephone Encounter (Signed)
 Appt made with Dr. Athena Bland for 03/09/2024 at 820am

## 2024-03-08 NOTE — Telephone Encounter (Signed)
 Can offer her next available in office - will be in June with me vs sooner with another provider for acute visit.

## 2024-03-09 ENCOUNTER — Ambulatory Visit: Admitting: Family Medicine

## 2024-03-22 ENCOUNTER — Encounter: Payer: Self-pay | Admitting: Family Medicine

## 2024-03-22 ENCOUNTER — Ambulatory Visit (INDEPENDENT_AMBULATORY_CARE_PROVIDER_SITE_OTHER): Admitting: Family Medicine

## 2024-03-22 VITALS — BP 140/99 | HR 77 | Ht 64.0 in | Wt 206.0 lb

## 2024-03-22 DIAGNOSIS — G4719 Other hypersomnia: Secondary | ICD-10-CM

## 2024-03-22 DIAGNOSIS — M797 Fibromyalgia: Secondary | ICD-10-CM

## 2024-03-22 DIAGNOSIS — E559 Vitamin D deficiency, unspecified: Secondary | ICD-10-CM | POA: Diagnosis not present

## 2024-03-22 DIAGNOSIS — G9339 Other post infection and related fatigue syndromes: Secondary | ICD-10-CM | POA: Diagnosis not present

## 2024-03-22 DIAGNOSIS — Z8349 Family history of other endocrine, nutritional and metabolic diseases: Secondary | ICD-10-CM

## 2024-03-22 DIAGNOSIS — R5382 Chronic fatigue, unspecified: Secondary | ICD-10-CM

## 2024-03-22 NOTE — Progress Notes (Signed)
 Established patient visit   Patient: Mackenzie Bailey   DOB: [birth year]   58 y.o. Female  MRN: [id number] Visit Date: 03/22/2024  Today's healthcare provider: Mimi Alt, MD   Chief Complaint  Patient presents with   Fatigue    For the last past couple months she has been really tired    Subjective     HPI     Fatigue    Additional comments: For the last past couple months she has been really tired       Last edited by Bart Lieu, CMA on 03/22/2024  1:56 PM.       Discussed the use of AI scribe software for clinical note transcription with the patient, who gave verbal consent to proceed.  History of Present Illness Mackenzie Bailey is a 58 year old female with hypertension and asthma who presents with increased fatigue.  She has experienced worsening fatigue over the past few weeks, describing it as more severe than usual. She recalls an episode where she felt compelled to rest her head on her desk, a level of fatigue she hasn't experienced since pregnancy. Her fatigue has been intermittent in the past, often correlating with low vitamin D  levels.  She is currently experiencing increased fatigue despite being on a steroid for asthma exacerbation, bronchitis, and sinusitis, which she was diagnosed with earlier this week. She feels excessively tired, even during meetings, and can easily doze off. She typically sleeps about seven hours a night, though her sleep is sometimes disrupted by night sweats and frequent urination, which she attributes to menopause. She occasionally uses trazodone  to aid sleep.  Her past medical history includes hypertension, asthma, benign neoplasm of the transverse colon, vertigo, insomnia, chronic fatigue, low vitamin D , anxiety, fibromyalgia, and a history of anemia. She has previously had low vitamin D  levels and has been prescribed vitamin D  supplements, but she questions the long-term efficacy and absorption,  especially since her gallbladder removal.  She has not experienced any abnormal bruising, bleeding, diarrhea, vomiting, or swollen lymph nodes recently, except for some swelling due to her current infection. No abdominal pain, burning with urination, blood in her stool or urine, and she has not noticed any petechiae. Her sister has Hashimoto's disease, and she is concerned about her thyroid function.     Past Medical History:  Diagnosis Date   Allergy    Anxiety    Asthma    SEASONAL    Complication of anesthesia    Dysplastic nevus 08/20/2021   left posterior shoulder, moderate   Dysplastic nevus 08/20/2021   left mid back, moderate to severe   Female cystocele    GERD (gastroesophageal reflux disease)    OCC   Headache    RARE MIGRAINES   Heart murmur    ASYMPTOMATIC   History of dysplastic nevus 03/28/2019   Left upper back. Severe atypia, close to margin. Excised 04/18/2019, margins free.   Hypertension    Menorrhagia    Mitral valve prolapse    PONV (postoperative nausea and vomiting)    WITH FRACTURE SURGERY ONLY   SUI (stress urinary incontinence, female)    Vaginal pessary present     Medications: Outpatient Medications Prior to Visit  Medication Sig   albuterol  (PROVENTIL ) (2.5 MG/3ML) 0.083% nebulizer solution Take 3 mLs (2.5 mg total) by nebulization every 6 (six) hours as needed for wheezing or shortness of breath.   ALPRAZolam  (XANAX ) 1 MG tablet TAKE 1 TABLET (1  MG TOTAL) BY MOUTH DAILY AS NEEDED FOR ANXIETY   amLODipine  (NORVASC ) 5 MG tablet TAKE 1 TABLET (5 MG TOTAL) BY MOUTH DAILY.   cholecalciferol (VITAMIN D3) 25 MCG (1000 UNIT) tablet Take 1,000 Units by mouth daily.   colesevelam (WELCHOL) 625 MG tablet Take 625 mg by mouth.   cyclobenzaprine  (FLEXERIL ) 5 MG tablet TAKE 1 TABLET BY MOUTH THREE TIMES A DAY AS NEEDED FOR MUSCLE SPASMS   diphenhydramine-acetaminophen  (TYLENOL  PM) 25-500 MG TABS tablet Take 1-2 tablets by mouth at bedtime as needed (sleep).    DULoxetine  (CYMBALTA ) 60 MG capsule TAKE 1 CAPSULE BY MOUTH EVERY DAY   esomeprazole (NEXIUM) 20 MG capsule Take 20 mg by mouth daily as needed (heart burn).   meclizine  (ANTIVERT ) 25 MG tablet Take 1 tablet (25 mg total) by mouth 3 (three) times daily as needed for dizziness.   montelukast  (SINGULAIR ) 10 MG tablet TAKE 1 TABLET BY MOUTH EVERYDAY AT BEDTIME   ondansetron  (ZOFRAN ) 4 MG tablet Take 1 tablet (4 mg total) by mouth every 8 (eight) hours as needed for nausea or vomiting.   oxybutynin  (DITROPAN -XL) 5 MG 24 hr tablet TAKE 1 TABLET BY MOUTH EVERYDAY AT BEDTIME   traZODone  (DESYREL ) 50 MG tablet TAKE 0.5-1 TABLETS (25-50 MG TOTAL) BY MOUTH AT BEDTIME AS NEEDED. FOR SLEEP   tretinoin  (RETIN-A ) 0.025 % cream APPLY TO AFFECTED AREA EVERY DAY AT BEDTIME   No facility-administered medications prior to visit.    Review of Systems  Last CBC Lab Results  Component Value Date   WBC 7.1 06/23/2023   HGB 13.2 06/23/2023   HCT 42.1 06/23/2023   MCV 82 06/23/2023   MCH 25.8 (L) 06/23/2023   RDW 15.2 06/23/2023   PLT 259 06/23/2023   Last metabolic panel Lab Results  Component Value Date   GLUCOSE 96 06/23/2023   NA 140 06/23/2023   K 3.8 06/23/2023   CL 101 06/23/2023   CO2 23 06/23/2023   BUN 9 06/23/2023   CREATININE 0.67 06/23/2023   EGFR 103 06/23/2023   CALCIUM 9.7 06/23/2023   PROT 6.6 06/23/2023   ALBUMIN 4.2 06/23/2023   LABGLOB 2.4 06/23/2023   AGRATIO 1.5 10/07/2022   BILITOT 0.3 06/23/2023   ALKPHOS 104 06/23/2023   AST 25 06/23/2023   Bailey 27 06/23/2023   ANIONGAP 13 05/28/2020   Last lipids Lab Results  Component Value Date   CHOL 199 06/23/2023   HDL 84 06/23/2023   LDLCALC 99 06/23/2023   TRIG 91 06/23/2023   CHOLHDL 2.4 06/23/2023   Last hemoglobin A1c Lab Results  Component Value Date   HGBA1C 5.7 (H) 06/23/2023   Last thyroid functions Lab Results  Component Value Date   TSH 1.560 06/23/2023   Last vitamin D  Lab Results  Component  Value Date   VD25OH 35.9 06/23/2023   Last vitamin B12 and Folate Lab Results  Component Value Date   VITAMINB12 335 04/07/2021   FOLATE 3.4 01/28/2020        Objective    BP (!) 140/99   Pulse 77   Ht 5\' 4"  (1.626 m)   Wt 206 lb (93.4 kg)   LMP 05/09/2019 (Exact Date)   SpO2 96%   BMI 35.36 kg/m  BP Readings from Last 3 Encounters:  03/22/24 (!) 140/99  12/16/23 124/86  07/25/23 120/70   Wt Readings from Last 3 Encounters:  03/22/24 206 lb (93.4 kg)  12/16/23 203 lb 4.8 oz (92.2 kg)  07/25/23 187 lb 3.2  oz (84.9 kg)        Physical Exam  Physical Exam GENERAL: No pallor, appears tired. HEENT: No petechiae in oropharynx. CARDIOVASCULAR: Regular rate and rhythm, no murmurs. ABDOMEN: Right-sided superficial musculoskeletal tenderness. No hepatomegaly or splenomegaly. No abdominal distention. EXTREMITIES: No lower extremity edema.    No results found for any visits on 03/22/24.  Assessment & Plan     Problem List Items Addressed This Visit       Other   Fibromyalgia   Excessive daytime sleepiness   Relevant Orders   TSH+T4F+T3Free   VITAMIN D  25 Hydroxy (Vit-D Deficiency, Fractures)   Vitamin B12   CBC   Home sleep test   Monospot   CMP14+EGFR   Chronic fatigue - Primary   Relevant Orders   TSH+T4F+T3Free   VITAMIN D  25 Hydroxy (Vit-D Deficiency, Fractures)   Vitamin B12   CBC   Home sleep test   Anti-TPO Ab (RDL)   Thyroglobulin Level   Monospot   CMP14+EGFR   Avitaminosis D   Relevant Orders   VITAMIN D  25 Hydroxy (Vit-D Deficiency, Fractures)   Other Visit Diagnoses       Family history of Hashimoto thyroiditis       Relevant Orders   Anti-TPO Ab (RDL)   Thyroglobulin Level     Other post infection and related fatigue syndromes       Relevant Orders   Monospot   CMP14+EGFR        Assessment & Plan Fatigue Worsening chronic fatigue with excessive daytime sleepiness, potentially linked to low vitamin D  levels and family  history of Hashimoto's thyroiditis. Differential diagnosis includes chronic fatigue syndrome, vitamin D  deficiency, vitamin B12 deficiency, anemia, thyroid dysfunction, and mononucleosis. Current steroid use for asthma exacerbation and bronchitis may contribute. Open to a home sleep study if insurance permits. - Order CBC to assess for anemia - Order vitamin D  and B12 levels - Order thyroid function tests including TSH, T4, T3, TPO antibodies, and thyroglobulin antibodies - Order monospot test for mononucleosis - Order complete metabolic panel - Arrange for home sleep study if insurance allows  Asthma exacerbation Currently experiencing an asthma exacerbation and on steroids. Reports bronchitis and sinusitis under treatment by an outside physician.  Vitamin D  deficiency Low vitamin D  levels persist despite supplementation. Post-cholecystectomy status may affect absorption. Requests continuation of vitamin D  50,000 units weekly for long-term management. - Consider continuation of vitamin D  50,000 units weekly for long-term management, to be discussed with her PCP  General Health Maintenance Not taking a multivitamin. - Recommend starting a multivitamin     Return in about 6 weeks (around 05/03/2024).         Mackenzie Alt, MD  [facility] [phone] (phone) [phone] (fax)  [facility]

## 2024-03-23 ENCOUNTER — Ambulatory Visit: Payer: Self-pay | Admitting: Family Medicine

## 2024-04-02 ENCOUNTER — Emergency Department

## 2024-04-02 ENCOUNTER — Other Ambulatory Visit: Payer: Self-pay

## 2024-04-02 ENCOUNTER — Emergency Department
Admission: EM | Admit: 2024-04-02 | Discharge: 2024-04-02 | Disposition: A | Discharge status: Home / Self Care | Attending: Emergency Medicine | Admitting: Emergency Medicine

## 2024-04-02 DIAGNOSIS — I10 Essential (primary) hypertension: Secondary | ICD-10-CM | POA: Diagnosis not present

## 2024-04-02 DIAGNOSIS — R0602 Shortness of breath: Secondary | ICD-10-CM | POA: Diagnosis present

## 2024-04-02 DIAGNOSIS — J45901 Unspecified asthma with (acute) exacerbation: Secondary | ICD-10-CM | POA: Diagnosis not present

## 2024-04-02 LAB — ANTI-TPO AB (RDL): Anti-TPO Ab (RDL): <9 [IU]/mL (ref <9.0)

## 2024-04-02 LAB — BASIC METABOLIC PANEL WITH GFR
Anion gap: 11 (ref 5–15)
BUN: 17 mg/dL (ref 6–20)
CO2: 26 mmol/L (ref 22–32)
Calcium: 10.1 mg/dL (ref 8.9–10.3)
Chloride: 100 mmol/L (ref 98–111)
Creatinine, Ser: 0.66 mg/dL (ref 0.44–1.00)
GFR, Estimated: >60 mL/min (ref >60)
Glucose, Bld: 137 mg/dL — ABNORMAL HIGH (ref 70–99)
Potassium: 3.8 mmol/L (ref 3.5–5.1)
Sodium: 137 mmol/L (ref 135–145)

## 2024-04-02 LAB — CMP14+EGFR
ALT: 17 IU/L (ref 0–32)
AST: 15 IU/L (ref 0–40)
Albumin: 4.3 g/dL (ref 3.8–4.9)
Alkaline Phosphatase: 123 IU/L — ABNORMAL HIGH (ref 44–121)
BUN/Creatinine Ratio: 29 — ABNORMAL HIGH (ref 9–23)
BUN: 15 mg/dL (ref 6–24)
Bilirubin Total: <0.2 mg/dL (ref 0.0–1.2)
CO2: 23 mmol/L (ref 20–29)
Calcium: 10.4 mg/dL — ABNORMAL HIGH (ref 8.7–10.2)
Chloride: 99 mmol/L (ref 96–106)
Creatinine, Ser: 0.51 mg/dL — ABNORMAL LOW (ref 0.57–1.00)
Globulin, Total: 2.4 g/dL (ref 1.5–4.5)
Glucose: 121 mg/dL — ABNORMAL HIGH (ref 70–99)
Potassium: 4.1 mmol/L (ref 3.5–5.2)
Sodium: 139 mmol/L (ref 134–144)
Total Protein: 6.7 g/dL (ref 6.0–8.5)
eGFR: 109 mL/min/{1.73_m2} (ref >59)

## 2024-04-02 LAB — CBC
HCT: 46.3 % — ABNORMAL HIGH (ref 36.0–46.0)
Hematocrit: 43.2 % (ref 34.0–46.6)
Hemoglobin: 13.9 g/dL (ref 11.1–15.9)
Hemoglobin: 15 g/dL (ref 12.0–15.0)
MCH: 25.8 pg — ABNORMAL LOW (ref 26.6–33.0)
MCH: 25.9 pg — ABNORMAL LOW (ref 26.0–34.0)
MCHC: 32.2 g/dL (ref 31.5–35.7)
MCHC: 32.4 g/dL (ref 30.0–36.0)
MCV: 80 fL (ref 79–97)
MCV: 79.8 fL — ABNORMAL LOW (ref 80.0–100.0)
Platelets: 324 10*3/uL (ref 150–450)
Platelets: 363 10*3/uL (ref 150–400)
RBC: 5.38 x10E6/uL — ABNORMAL HIGH (ref 3.77–5.28)
RBC: 5.8 MIL/uL — ABNORMAL HIGH (ref 3.87–5.11)
RDW: 14.2 % (ref 11.7–15.4)
RDW: 15.5 % (ref 11.5–15.5)
WBC: 13.2 10*3/uL — ABNORMAL HIGH (ref 3.4–10.8)
WBC: 18.2 10*3/uL — ABNORMAL HIGH (ref 4.0–10.5)
nRBC: 0 % (ref 0.0–0.2)

## 2024-04-02 LAB — TSH+T4F+T3FREE
Free T4: 1.08 ng/dL (ref 0.82–1.77)
T3, Free: 2.4 pg/mL (ref 2.0–4.4)
TSH: 0.309 u[IU]/mL — ABNORMAL LOW (ref 0.450–4.500)

## 2024-04-02 LAB — VITAMIN D 25 HYDROXY (VIT D DEFICIENCY, FRACTURES): Vit D, 25-Hydroxy: 29.1 ng/mL — ABNORMAL LOW (ref 30.0–100.0)

## 2024-04-02 LAB — THYROGLOBULIN LEVEL: Thyroglobulin (TG-RIA): 12 ng/mL

## 2024-04-02 LAB — MONONUCLEOSIS SCREEN: Mono Screen: NEGATIVE

## 2024-04-02 LAB — VITAMIN B12: Vitamin B-12: 385 pg/mL (ref 232–1245)

## 2024-04-02 LAB — TROPONIN I (HIGH SENSITIVITY): Troponin I (High Sensitivity): 7 ng/L (ref <18)

## 2024-04-02 MED ORDER — IPRATROPIUM-ALBUTEROL 0.5-2.5 (3) MG/3ML IN SOLN
3.0000 mL | Freq: Once | RESPIRATORY_TRACT | Status: AC
  Filled 2024-04-02: qty 3

## 2024-04-02 MED ORDER — IPRATROPIUM BROMIDE 0.02 % IN SOLN: 0.5000 mg | Freq: Four times a day (QID) | RESPIRATORY_TRACT | 0 refills | Status: AC | PRN

## 2024-04-02 MED ORDER — PREDNISONE 20 MG PO TABS
60.0000 mg | ORAL_TABLET | Freq: Once | ORAL | Status: AC
  Administered 2024-04-02: 60 mg via ORAL
  Filled 2024-04-02: qty 3

## 2024-04-02 MED ORDER — IOHEXOL 350 MG/ML SOLN
75.0000 mL | Freq: Once | INTRAVENOUS | Status: AC | PRN
  Administered 2024-04-02: 75 mL via INTRAVENOUS

## 2024-04-02 MED ORDER — PREDNISONE 10 MG PO TABS: ORAL_TABLET | ORAL | 0 refills | Status: DC

## 2024-04-02 NOTE — ED Notes (Signed)
 No second trop needed per [place].

## 2024-04-02 NOTE — ED Notes (Signed)
 Pt back from CT

## 2024-04-02 NOTE — ED Triage Notes (Signed)
 Pt come with sob and asthma. Pt states sinus infection and bronchitis for about 3 weeks.

## 2024-04-02 NOTE — ED Notes (Signed)
 Pt states has been on prednisone  for 2 weeks for asthma. States felt like she was in an asthmatic state and went to [facility] who sent her here. States has been trying to rest and calm herself and the sensation has eased somewhat. Pt has unlabored respirations with no accessory muscle use. No tachypnea noted. Lungs clear.

## 2024-04-02 NOTE — ED Notes (Signed)
 First nurse note. Pt from [facility] with reports of SHOB and chest pressure. Pt has been taking prednisone  and breathing tx at home with no relief.

## 2024-04-02 NOTE — ED Provider Notes (Signed)
 [facility] Provider Note    Event Date/Time   First MD Initiated Contact with Patient 04/02/24 1527     (approximate)   History   Chief Complaint Shortness of Breath   HPI  Nakeitha Milligan is a 58 y.o. female with past medical history of hypertension, asthma, GERD, and fibromyalgia who presents to the ED complaining of shortness of breath.  Patient reports that she has been dealing with waxing and waning cough, shortness of breath, and chest pressure for the past 3 weeks.  She was initially diagnosed with a sinus infection and bronchitis at urgent care, completed a course of antibiotics and steroids with improvement.  Symptoms returned shortly after completion of these medications and she was prescribed another course of antibiotics and steroids.  She states that she completed the steroid a couple of days ago and is now having recurrent difficulty breathing with pressure and heaviness in her chest.  She has not noticed any pain or swelling in her legs and denies any cardiac history.  She continues to have a dry cough but denies fevers.     Physical Exam   Triage Vital Signs: ED Triage Vitals  Encounter Vitals Group     BP 04/02/24 1401 118/84     Girls Systolic BP Percentile --      Girls Diastolic BP Percentile --      Boys Systolic BP Percentile --      Boys Diastolic BP Percentile --      Pulse Rate 04/02/24 1401 (!) 113     Resp 04/02/24 1401 20     Temp 04/02/24 1401 97.8 F (36.6 C)     Temp src --      SpO2 04/02/24 1401 100 %     Weight 04/02/24 1400 206 lb (93.4 kg)     Height 04/02/24 1400 5' 4 (1.626 m)     Head Circumference --      Peak Flow --      Pain Score 04/02/24 1400 2     Pain Loc --      Pain Education --      Exclude from Growth Chart --     Most recent vital signs: Vitals:   04/02/24 1401 04/02/24 1538  BP: 118/84   Pulse: (!) 113 96  Resp: 20 20  Temp: 97.8 F (36.6 C)   SpO2: 100% 95%     Constitutional: Alert and oriented. Eyes: Conjunctivae are normal. Head: Atraumatic. Nose: No congestion/rhinnorhea. Mouth/Throat: Mucous membranes are moist.  Cardiovascular: Normal rate, regular rhythm. Grossly normal heart sounds.  2+ radial pulses bilaterally. Respiratory: Normal respiratory effort.  No retractions. Lungs with mild end expiratory wheezing. Gastrointestinal: Soft and nontender. No distention. Musculoskeletal: No lower extremity tenderness nor edema.  Neurologic:  Normal speech and language. No gross focal neurologic deficits are appreciated.    ED Results / Procedures / Treatments   Labs (all labs ordered are listed, but only abnormal results are displayed) Labs Reviewed  BASIC METABOLIC PANEL WITH GFR - Abnormal; Notable for the following components:      Result Value   Glucose, Bld 137 (*)    All other components within normal limits  CBC - Abnormal; Notable for the following components:   WBC 18.2 (*)    RBC 5.80 (*)    HCT 46.3 (*)    MCV 79.8 (*)    MCH 25.9 (*)    All other components within normal limits  TROPONIN I (  HIGH SENSITIVITY)     EKG  ED ECG REPORT I, Twilla Galea, the attending physician, personally viewed and interpreted this ECG.   Date: 04/02/2024  EKG Time: 14:05  Rate: 110  Rhythm: sinus tachycardia  Axis: LAD  Intervals:none  ST&T Change: None  RADIOLOGY Chest x-ray reviewed and interpreted by me with no infiltrate, edema, or effusion.  PROCEDURES:  Critical Care performed: No  Procedures   MEDICATIONS ORDERED IN ED: Medications  ipratropium-albuterol  (DUONEB) 0.5-2.5 (3) MG/3ML nebulizer solution 3 mL (3 mLs Nebulization Given 04/02/24 1559)  predniSONE  (DELTASONE ) tablet 60 mg (60 mg Oral Given 04/02/24 1600)  iohexol (OMNIPAQUE) 350 MG/ML injection 75 mL (75 mLs Intravenous Contrast Given 04/02/24 1626)     IMPRESSION / MDM / ASSESSMENT AND PLAN / ED COURSE  I reviewed the triage vital signs and the  nursing notes.                              58 y.o. female with past medical history of hypertension, asthma, GERD, and fibromyalgia who presents to the ED with waxing and waning difficulty breathing, chest pressure, and dry cough for the past 3 weeks.  Patient's presentation is most consistent with acute presentation with potential threat to life or bodily function.  Differential diagnosis includes, but is not limited to, asthma exacerbation, bronchitis, pneumonia, ACS, PE, anemia, electrolyte abnormality, AKI.  Patient nontoxic-appearing and in no acute distress, vital signs are unremarkable.  She is not in any respiratory distress and maintaining oxygen saturations at 95% on room air, lungs do have mild end expiratory wheezing.  We will give dose of prednisone  and treat with DuoNeb, chest x-ray is unremarkable.  Plan to further assess with CTA chest to rule out PE, labs show leukocytosis likely due to recent course of steroids.  No significant anemia, electrolyte abnormality, or AKI noted, will add on troponin.  CTA chest is negative for PE, does show multiple small pulmonary nodules and patient to be referred to pulmonary for follow-up.  She does report feeling better on reassessment, appropriate for outpatient management and we will prescribe longer steroid taper with ipratropium to add to her albuterol  as needed.  She was counseled to return to the ED for new or worsening symptoms, patient agrees with plan.      FINAL CLINICAL IMPRESSION(S) / ED DIAGNOSES   Final diagnoses:  Exacerbation of persistent asthma, unspecified asthma severity     Rx / DC Orders   ED Discharge Orders          Ordered    predniSONE  (DELTASONE ) 10 MG tablet  Daily        04/02/24 1736    ipratropium (ATROVENT) 0.02 % nebulizer solution  Every 6 hours PRN        04/02/24 1736             Note:  This document was prepared using Dragon voice recognition software and may include unintentional  dictation errors.   Twilla Galea, MD 04/02/24 [phone]

## 2024-04-09 ENCOUNTER — Ambulatory Visit: Admitting: Family Medicine

## 2024-04-10 ENCOUNTER — Ambulatory Visit (INDEPENDENT_AMBULATORY_CARE_PROVIDER_SITE_OTHER): Admitting: Family Medicine

## 2024-04-10 ENCOUNTER — Encounter: Payer: Self-pay | Admitting: Family Medicine

## 2024-04-10 VITALS — BP 130/91 | HR 90 | Ht 64.0 in

## 2024-04-10 DIAGNOSIS — J4520 Mild intermittent asthma, uncomplicated: Secondary | ICD-10-CM

## 2024-04-10 DIAGNOSIS — J4541 Moderate persistent asthma with (acute) exacerbation: Secondary | ICD-10-CM | POA: Diagnosis not present

## 2024-04-10 MED ORDER — TRELEGY ELLIPTA 200-62.5-25 MCG/ACT IN AEPB: 1.0000 | INHALATION_SPRAY | Freq: Every day | RESPIRATORY_TRACT | 0 refills | Status: DC

## 2024-04-10 MED ORDER — PREDNISONE 10 MG PO TABS: ORAL_TABLET | ORAL | 0 refills | Status: AC

## 2024-04-10 NOTE — Progress Notes (Signed)
 Established Patient Office Visit  Subjective   Patient ID: Mackenzie Bailey, female    DOB: May 14, 1966  Age: 58 y.o. MRN: [id number]  Chief Complaint  Patient presents with   Follow-up    Patient was seen at [facility] on 04/02/24. States the prednisone  helps but it is not improving and would like a refill until she can be seen as she would like a referral to pulmonology. She reports using nebulizer treatment that caused headaches and made her jittery so using prednisone  and otc delsym cough. She would like to see [facility] - pulmonolgy   Mackenzie Bailey is a 58 y/o female with history of asthma and GERD following up from an ED visit where she was diagnosed with an asthma exacerbation. She has adult-onset asthma and has been on montelukast  for the past 5 years with no episodes of severe exacerbation. Around 3 weeks ago, she developed a cough and had some sinus congestion and was diagnosed with bronchitis and sinus infection and given prednisone . After stopping the first course, symptoms returned and she was prescribed  a second round. After the second round was complete, she felt badly again and was seen in the primary clinic at [facility] where she was sent to the ED for chest pressure and shortness of breath. Workup was negative for PE, MI, or pneumonia. She was discharged home on another prednisone  taper and told to follow up with pulmonology.   She is here today because her insurance requires she see primary care before a referral to pulmonology can be sent. She is wondering about getting more prednisone  to help her until she can see the pulmonologist as her symptoms have recently and historically been refractory to albuterol  and ipratropium inhalers. She stopped taking the nebulizer treatments as she did not experience relief with them but they did make her feel jittery and gave her a headache. She is unsure if she has tried inhaled corticosteroids in the past. She endorses a feeling of  elephant on her chest most mornings that is improved after she takes her prednisone . She denies any shortness of breath today but does still have a persistent cough.   Past Medical History:  Diagnosis Date   Allergy    Anxiety    Asthma    SEASONAL    Complication of anesthesia    Dysplastic nevus 08/20/2021   left posterior shoulder, moderate   Dysplastic nevus 08/20/2021   left mid back, moderate to severe   Female cystocele    GERD (gastroesophageal reflux disease)    OCC   Headache    RARE MIGRAINES   Heart murmur    ASYMPTOMATIC   History of dysplastic nevus 03/28/2019   Left upper back. Severe atypia, close to margin. Excised 04/18/2019, margins free.   Hypertension    Menorrhagia    Mitral valve prolapse    PONV (postoperative nausea and vomiting)    WITH FRACTURE SURGERY ONLY   SUI (stress urinary incontinence, female)    Vaginal pessary present    Past Surgical History:  Procedure Laterality Date   ABDOMINAL HYSTERECTOMY     BREAST BIOPSY Right 2012?   benign   BREAST SURGERY     benign biobsy   CHOLECYSTECTOMY     COLONOSCOPY WITH PROPOFOL  N/A 02/24/2018   Procedure: COLONOSCOPY WITH PROPOFOL ;  Surgeon: Jinny Carmine, MD;  Location: [facility];  Service: Endoscopy;  Laterality: N/A;   CYSTOCELE REPAIR N/A 06/14/2019   Procedure: ANTERIOR REPAIR (CYSTOCELE);  Surgeon:  Arloa Lamar SQUIBB, MD;  Location: [facility];  Service: Gynecology;  Laterality: N/A;   CYSTOSCOPY N/A 06/14/2019   Procedure: CYSTOSCOPY;  Surgeon: Arloa Lamar SQUIBB, MD;  Location: [facility];  Service: Gynecology;  Laterality: N/A;   FRACTURE SURGERY     R lower leg   IUD REMOVAL  06/14/2019   Procedure: INTRAUTERINE DEVICE (IUD) REMOVAL;  Surgeon: Arloa Lamar SQUIBB, MD;  Location: [facility];  Service: Gynecology;;   LAPAROSCOPIC VAGINAL HYSTERECTOMY WITH SALPINGECTOMY Bilateral 06/14/2019   Procedure: LAPAROSCOPIC ASSISTED VAGINAL HYSTERECTOMY WITH BILATERAL SALPINGECTOMY;  Surgeon: Arloa Lamar SQUIBB, MD;  Location: [facility];  Service: Gynecology;  Laterality: Bilateral;   POLYPECTOMY     uterine, found on sonohystogram, contributed to menorrhagia    POLYPECTOMY  02/24/2018   Procedure: POLYPECTOMY;  Surgeon: Jinny Carmine, MD;  Location: [facility];  Service: Endoscopy;;   PUBOVAGINAL SLING N/A 06/14/2019   Procedure: CARLOYN GLADE;  Surgeon: Arloa Lamar SQUIBB, MD;  Location: [facility];  Service: Gynecology;  Laterality: N/A;      Objective:     BP (!) 130/91 (BP Location: Right Arm, Patient Position: Sitting, Cuff Size: Normal)   Pulse 90   Ht 5' 4 (1.626 m)   LMP 05/09/2019 (Exact Date)   BMI 35.36 kg/m  BP Readings from Last 3 Encounters:  04/10/24 (!) 130/91  04/02/24 132/64  03/22/24 (!) 140/99   Wt Readings from Last 3 Encounters:  04/02/24 206 lb (93.4 kg)  03/22/24 206 lb (93.4 kg)  12/16/23 203 lb 4.8 oz (92.2 kg)      Physical Exam Vitals reviewed.  Constitutional:      General: She is not in acute distress.    Appearance: Normal appearance. She is well-developed. She is not diaphoretic.  HENT:     Head: Normocephalic and atraumatic.   Eyes:     General: No scleral icterus.    Conjunctiva/sclera: Conjunctivae normal.   Neck:     Thyroid: No thyromegaly.   Cardiovascular:     Rate and Rhythm: Normal rate and regular rhythm.     Heart sounds: Normal heart sounds. No murmur heard. Pulmonary:     Effort: Pulmonary effort is normal. No respiratory distress.     Breath sounds: Wheezing (mild) present. No rhonchi or rales.   Musculoskeletal:     Cervical back: Neck supple.     Right lower leg: No edema.     Left lower leg: No edema.  Lymphadenopathy:     Cervical: No cervical adenopathy.   Skin:    General: Skin is warm and dry.     Findings: No rash.   Neurological:     Mental Status: She is alert and oriented to person, place, and time. Mental status is at baseline.   Psychiatric:        Mood and Affect: Mood normal.         Behavior: Behavior normal.      No results found for any visits on 04/10/24.  Last CBC Lab Results  Component Value Date   WBC 18.2 (H) 04/02/2024   HGB 15.0 04/02/2024   HCT 46.3 (H) 04/02/2024   MCV 79.8 (L) 04/02/2024   MCH 25.9 (L) 04/02/2024   RDW 15.5 04/02/2024   PLT 363 04/02/2024   Last metabolic panel Lab Results  Component Value Date   GLUCOSE 137 (H) 04/02/2024   NA 137 04/02/2024   K 3.8 04/02/2024   CL 100 04/02/2024   CO2 26 04/02/2024  BUN 17 04/02/2024   CREATININE 0.66 04/02/2024   GFRNONAA >60 04/02/2024   CALCIUM 10.1 04/02/2024   PROT 6.7 03/22/2024   ALBUMIN 4.3 03/22/2024   LABGLOB 2.4 03/22/2024   AGRATIO 1.5 10/07/2022   BILITOT <0.2 03/22/2024   ALKPHOS 123 (H) 03/22/2024   AST 15 03/22/2024   ALT 17 03/22/2024   ANIONGAP 11 04/02/2024      The 10-year ASCVD risk score (Arnett DK, et al., 2019) is: 2.1%    Assessment & Plan:   Problem List Items Addressed This Visit       Respiratory   Asthma   Previously well controlled with Singulair  but currently in a period of exacerbation refractory to the inhaler treatments she has tried. It has been well controlled with oral corticosteroids but is doing poorly when she stops taking them. ED recommended pulmonology follow-up based on nodules found on CT and for asthma management. We discussed options for treatment until she sees pulmonology. We dispensed a sample of Trelegy for her and prednisone  if that fails. -Referral sent to Pulmonology -Trelegy given; first dose in office today tolerated well -Prednisone  sent in if trelegy does not help      Relevant Medications   predniSONE  (DELTASONE ) 10 MG tablet   Other Visit Diagnoses       Moderate persistent asthma with exacerbation    -  Primary   Relevant Medications   predniSONE  (DELTASONE ) 10 MG tablet   Other Relevant Orders   Ambulatory referral to Pulmonology       No follow-ups on file.    Leonor JAYSON Clause, Medical  Student   Patient seen along with MS3 student, Spurgeon Clause. I personally evaluated this patient along with the student, and verified all aspects of the history, physical exam, and medical decision making as documented by the student. I agree with the student's documentation and have made all necessary edits.  Ramzy Cappelletti, Jon HERO, MD, MPH [facility]

## 2024-04-10 NOTE — Assessment & Plan Note (Signed)
 Previously well controlled with Singulair  but currently in a period of exacerbation refractory to the inhaler treatments she has tried. It has been well controlled with oral corticosteroids but is doing poorly when she stops taking them. ED recommended pulmonology follow-up based on nodules found on CT and for asthma management. We discussed options for treatment until she sees pulmonology. We dispensed a sample of Trelegy for her and prednisone  if that fails. -Referral sent to Pulmonology -Trelegy given; first dose in office today tolerated well -Prednisone  sent in if trelegy does not help

## 2024-04-11 ENCOUNTER — Other Ambulatory Visit (HOSPITAL_COMMUNITY): Payer: Self-pay

## 2024-04-11 ENCOUNTER — Other Ambulatory Visit: Payer: Self-pay | Admitting: Pulmonary Disease

## 2024-04-11 ENCOUNTER — Telehealth: Payer: Self-pay

## 2024-04-11 ENCOUNTER — Ambulatory Visit (INDEPENDENT_AMBULATORY_CARE_PROVIDER_SITE_OTHER): Admitting: Pulmonary Disease

## 2024-04-11 ENCOUNTER — Encounter: Payer: Self-pay | Admitting: Pulmonary Disease

## 2024-04-11 VITALS — BP 124/84 | HR 96 | Temp 97.1°F | Ht 64.0 in | Wt 209.0 lb

## 2024-04-11 DIAGNOSIS — R0602 Shortness of breath: Secondary | ICD-10-CM

## 2024-04-11 DIAGNOSIS — J4540 Moderate persistent asthma, uncomplicated: Secondary | ICD-10-CM

## 2024-04-11 LAB — NITRIC OXIDE: Nitric Oxide: 10

## 2024-04-11 MED ORDER — LEVALBUTEROL TARTRATE 45 MCG/ACT IN AERO: 2.0000 | INHALATION_SPRAY | Freq: Four times a day (QID) | RESPIRATORY_TRACT | 12 refills | Status: DC | PRN

## 2024-04-11 MED ORDER — BUDESONIDE-FORMOTEROL FUMARATE 160-4.5 MCG/ACT IN AERO: 2.0000 | INHALATION_SPRAY | Freq: Two times a day (BID) | RESPIRATORY_TRACT | 12 refills | Status: DC

## 2024-04-11 NOTE — Progress Notes (Signed)
 Synopsis: Referred in by Mackenzie Jon HERO, MD   Subjective:   PATIENT ID: Mackenzie Bailey GENDER: female DOB: 06-15-1966, MRN: [id number]  Chief Complaint  Patient presents with   Consult    Asthma. SOB. Wheezing. Cough, dry.     HPI Mackenzie Bailey is a pleasant 58 year old female patient with a past medical history of adult onset asthma, anxiety, hypertension, seasonal allergies presenting today to the pulmonary clinic to establish care.  She reports she was diagnosed with asthma about 6 years ago and saw a pulmonologist at [facility] clinic where she trialed multiple inhalers and none of them worked for her.  She eventually trialed Singulair  that seem to have relieved her symptoms.  She has been on Singulair  for years.  She reports that her asthma usually flares up in the fall and manifest as cough wheezing and chest tightness.  About 3 weeks ago she had an episode of sinusitis and asthma flare and was put on prednisone  which significantly improved her symptoms.  When she tapered off prednisone  her symptoms recurred and is currently finishing her third week of prednisone  taper.  She was never hospitalized for asthma.  She is usually hypersensitive to cold air and high humidity.  She does report allergic rhinitis but no eczema.  Family history -father with asthma  Social history -never smoked -drinks alcohol occasionally -Works at [employer] -lives at home with her husband and son.  Has only 1 dog at home.  ROS All symptoms were reviewed and are negative except for the above.  Objective:   Vitals:   04/11/24 1058  BP: 124/84  Pulse: 96  Temp: (!) 97.1 F (36.2 C)  SpO2: 94%  Weight: 209 lb (94.8 kg)  Height: 5' 4 (1.626 m)   94% on RA BMI Readings from Last 3 Encounters:  04/11/24 35.87 kg/m  04/10/24 35.36 kg/m  04/02/24 35.36 kg/m   Wt Readings from Last 3 Encounters:  04/11/24 209 lb (94.8 kg)  04/02/24 206 lb (93.4 kg)  03/22/24 206 lb (93.4 kg)     Physical Exam GEN: NAD, Healthy Appearing HEENT: Supple Neck, Reactive Pupils, EOMI  CVS: Normal S1, Normal S2, RRR, No murmurs or ES appreciated  Lungs: Clear bilateral air entry.  Abdomen: Soft, non tender, non distended, + BS  Extremities: Warm and well perfused, No edema  Skin: No suspicious lesions appreciated  Psych: Normal Affect  Ancillary Information   CBC    Component Value Date/Time   WBC 18.2 (H) 04/02/2024 1401   RBC 5.80 (H) 04/02/2024 1401   HGB 15.0 04/02/2024 1401   HGB 13.9 03/22/2024 1433   HCT 46.3 (H) 04/02/2024 1401   HCT 43.2 03/22/2024 1433   PLT 363 04/02/2024 1401   PLT 324 03/22/2024 1433   MCV 79.8 (L) 04/02/2024 1401   MCV 80 03/22/2024 1433   MCH 25.9 (L) 04/02/2024 1401   MCHC 32.4 04/02/2024 1401   RDW 15.5 04/02/2024 1401   RDW 14.2 03/22/2024 1433   LYMPHSABS 2.2 06/23/2023 1422   EOSABS 0.1 06/23/2023 1422   BASOSABS 0.0 06/23/2023 1422    CTA chest 03/2024 - Negative for PE. Multiple small pulmonary nodules largest measuring 3mm.      No data to display           Assessment & Plan:  Mackenzie Bailey is a pleasant 58 year old female patient with a past medical history of adult onset asthma, anxiety, hypertension, seasonal allergies presenting today to the pulmonary clinic to establish  care.  #Moderate persistent Asthma  Dx 6 years ago. Sx include chest tightness, cough and wheezing. Flare in fall.  No PFTs.  FENO 10 negative for intrapulmonary eosinophilic inflammation.  No phenotype  []  CBC w/ Diff + Allergen Panel  []  Obtain PFTs.  []  Start budesonide-formoterol [symbicort- 160-4.5 2puffs twice a day. Mouth rinse with water  after each use.  []  Levalbuterol as needed.  []  C/w Singulair  10mg  1 tab daily.    Return in about 3 months (around 07/12/2024).  I spent 60 minutes caring for this patient today, including preparing to see the patient, obtaining a medical history , reviewing a separately obtained history, performing a  medically appropriate examination and/or evaluation, counseling and educating the patient/family/caregiver, ordering medications, tests, or procedures, documenting clinical information in the electronic health record, and independently interpreting results (not separately reported/billed) and communicating results to the patient/family/caregiver  Darrin Barn, MD [facility] 04/11/2024 11:30 AM

## 2024-04-11 NOTE — Telephone Encounter (Signed)
 Copied from CRM [id number]. Topic: Clinical - Prescription Issue >> Apr 11, 2024 12:57 PM Corean SAUNDERS wrote: Reason for CRM: Patient states that she received a message from [facility] that there was an update needed for the prescription Dr. Malka ordered for her today: levalbuterol (XOPENEX HFA) 45 MCG/ACT inhale  Patient states no other information provided but that this medication can't be filled at this time.

## 2024-04-11 NOTE — Telephone Encounter (Signed)
*  Pulm  Pharmacy Patient Advocate Encounter   Received notification from RX Request Messages that prior authorization for levalbuterol (XOPENEX HFA) 45 MCG/ACT inhaler  is required/requested.   Insurance verification completed.   The patient is insured through [facility] .   Per test claim:  Albuterol  HFA is preferred by the insurance.  If suggested medication is appropriate, Please send in a new RX and discontinue this one. If not, please advise as to why it's not appropriate so that we may request a Prior Authorization. Please note, some preferred medications may still require a PA.  If the suggested medications have not been trialed and there are no contraindications to their use, the PA will not be submitted, as it will not be approved.

## 2024-04-11 NOTE — Telephone Encounter (Signed)
 PA request has been Received. New Encounter has been or will be created for follow up. For additional info see Pharmacy Prior Auth telephone encounter from 06/25.

## 2024-04-12 NOTE — Telephone Encounter (Signed)
 Copied from CRM [id number]. Topic: Clinical - Prescription Issue >> Apr 12, 2024  1:27 PM Isabell A wrote: Reason for CRM: Relayed message to patient: Please ask pt if willing to try albuterol  HFA for her rescue or if she's failed this in past, canhave Juliana submit PA for Skip    Patient is willing to try Albuterol , patient is requesting the price first.

## 2024-04-12 NOTE — Telephone Encounter (Signed)
 LMTCB. E2C2 please advise when patient calls back.    I do not know the price until we send in the medication. She can call her insurance and ask if she would like. Please let me know if she would like me to send in the prescription.

## 2024-04-12 NOTE — Telephone Encounter (Signed)
 Copied from CRM [id number]. Topic: Referral - Prior Authorization Question >> Apr 12, 2024 11:57 AM Celestine FALCON wrote: Reason for CRM: Pt stated Dr. Malka requested the patient two receive two inhalers, budesonide-formoterol (SYMBICORT) 160-4.5 MCG/ACT inhaler & levalbuterol (XOPENEX HFA) 45 MCG/ACT inhaler. Pt stated she was able to pick up  budesonide-formoterol (SYMBICORT) 160-4.5 MCG/ACT inhaler, but the levalbuterol (XOPENEX HFA) 45 MCG/ACT inhaler required a PA. Pt would like to see what the cost would be and have someone check on the PA for her. Pt's phone number is [phone] ok to leave a message.

## 2024-04-12 NOTE — Telephone Encounter (Signed)
 LMTCB. E2C2 please advise when patient calls back.

## 2024-04-16 ENCOUNTER — Telehealth: Payer: Self-pay

## 2024-04-16 DIAGNOSIS — R0602 Shortness of breath: Secondary | ICD-10-CM

## 2024-04-16 MED ORDER — ALBUTEROL SULFATE HFA 108 (90 BASE) MCG/ACT IN AERS: 2.0000 | INHALATION_SPRAY | Freq: Four times a day (QID) | RESPIRATORY_TRACT | 2 refills | Status: AC | PRN

## 2024-04-16 MED ORDER — ALBUTEROL SULFATE HFA 108 (90 BASE) MCG/ACT IN AERS: 2.0000 | INHALATION_SPRAY | Freq: Four times a day (QID) | RESPIRATORY_TRACT | 11 refills | Status: AC | PRN

## 2024-04-16 NOTE — Telephone Encounter (Signed)
 Copied from CRM [id number]. Topic: Clinical - Prescription Issue >> Apr 12, 2024  1:27 PM Mackenzie Bailey wrote: Reason for CRM: Relayed message to patient: Please ask pt if willing to try albuterol  HFA for her rescue or if she's failed this in past, canhave Mackenzie Bailey submit PA for Skip  Patient is willing to try Albuterol , patient is requesting the price first.  >> Apr 16, 2024  8:31 AM Mackenzie Bailey wrote: Pt is requesting the [facility], she will see what the price is and then determine if she wants to stay with regular albuterol .

## 2024-04-16 NOTE — Telephone Encounter (Signed)
 [facility]  Nothing further needed.

## 2024-04-16 NOTE — Telephone Encounter (Signed)
 Mackenzie Bailey   04/16/2024  8:31 AM  Type: General  Pt is requesting the [facility], she will see what the price is and then determine if she wants to stay with regular albuterol .

## 2024-04-24 NOTE — Addendum Note (Signed)
 Addended by: Cire Clute J on: 04/24/2024 04:09 PM   Modules accepted: Orders

## 2024-05-08 ENCOUNTER — Ambulatory Visit: Payer: Self-pay | Admitting: Pulmonary Disease

## 2024-05-08 NOTE — Telephone Encounter (Signed)
 OV scheduled for tomorrow with [facility]. Patient aware.

## 2024-05-08 NOTE — Telephone Encounter (Signed)
 FYI Only or Action Required?: Action required by provider: refusing ED, requesting meds and/or appt.  Patient is followed in Pulmonology for asthma, last seen on 04/11/2024 by [name], MD.  Called Nurse Triage reporting Shortness of Breath, Cough, Fatigue, and no relief from rescue meds.  Symptoms began several weeks ago.  Interventions attempted: Rescue inhaler, Maintenance inhaler, and Nebulizer treatments.  Symptoms are: persisting, not improving with rescue meds.  Triage Disposition: Go to ED Now (Notify PCP)  Patient/caregiver understands and will follow disposition?: No, refuses disposition     Copied from CRM #[id number]. Topic: Clinical - Red Word Triage >> May 08, 2024  1:43 PM Mackenzie Bailey wrote: Red Word that prompted transfer to Nurse Triage: Patient states she was prescribed 2 inhalers's and none of them seems to be helping, having trouble breathing, SOB & Bailey cough. Reason for Disposition  [1] MODERATE difficulty breathing (e.g., speaks in phrases, SOB even at rest, pulse 100-120) AND [2] NEW-onset or WORSE than normal  Answer Assessment - Initial Assessment Questions E2C2 Pulmonary Triage - Initial Assessment Questions Chief Complaint (e.g., cough, sob, wheezing, fever, chills, sweat or additional symptoms) *Go to specific symptom protocol after initial questions. More SOB than usual just with moving around, even just sitting down Cough with coughing fits, starting to exhaust me, not coughing anything up Was started on inhaler with rescue inhaler, felt like was working, was not having to use rescue inhaler as much but 2 Sundays ago Around 7/13 seemed to be getting bad, was outside big amount of day, getting worse, using rescue inhaler Bailey lot more Feel almost at steroid point again, was trying not to be on it Not really chest pain, just SOB, struggling to breathe Bailey little bit Speaking in phrases to full sentences but gets short with coughing fits Just tired Rapid  breathing at times over phone Sounds really tight  How long have symptoms been present? Couple weeks  MEDICINES:   Have you used any OTC meds to help with symptoms? Yes If yes, ask What medications? Sometimes dayquil and nyquil  Have you used your inhalers/maintenance medication? Yes If yes, What medications? Symbicort  Albuterol  inhaler nebulizer  If inhaler, ask How many puffs and how often? Note: Review instructions on medication in the chart. More than I should, desperate to stop At least every 4 hours, no relief with this Nebulizer not helped either, done 2x  OXYGEN: Do you wear supplemental oxygen? No  Do you monitor your oxygen levels? Yes If yes, What is your reading (oxygen level) today? Lower, 90% usually, 89% here and there but usually around 90% for the past couple days  What is your usual oxygen saturation reading?  (Note: Pulmonary O2 sats should be 90% or greater) 98-99% when not in this state  6. CARDIAC HISTORY: Do you have any history of heart disease? (e.g., heart attack, angina, bypass surgery, angioplasty)      HTN, BP been okay  7. LUNG HISTORY: Do you have any history of lung disease?  (e.g., pulmonary embolus, asthma, emphysema)     Asthma  9. OTHER SYMPTOMS: Do you have any other symptoms? (e.g., chest pain, cough, dizziness, fever, runny nose)     No fever, runny nose, other symptoms  ED is not an option for me, didn't even give me oxygen with pulse ox that low then got $6,000 bill   Advised pt go to ED for severity of SOB, pt refusing, requesting prednisone  and/or other meds or appt. Sending message to  pulm for call back to pt cell phone [phone] with further recommendations. Advised pt go to ED and/or give call back if any worsening or new symptoms.  Protocols used: Breathing Difficulty-Bailey-AH

## 2024-05-09 ENCOUNTER — Encounter: Payer: Self-pay | Admitting: Pulmonary Disease

## 2024-05-09 ENCOUNTER — Ambulatory Visit: Admitting: Pulmonary Disease

## 2024-05-09 VITALS — BP 126/82 | HR 94 | Temp 97.3°F | Ht 64.0 in | Wt 208.4 lb

## 2024-05-09 DIAGNOSIS — R0602 Shortness of breath: Secondary | ICD-10-CM | POA: Diagnosis not present

## 2024-05-09 MED ORDER — PREDNISONE 20 MG PO TABS: 20.0000 mg | ORAL_TABLET | Freq: Every day | ORAL | 0 refills | Status: AC

## 2024-05-09 MED ORDER — BREZTRI AEROSPHERE 160-9-4.8 MCG/ACT IN AERO: 2.0000 | INHALATION_SPRAY | Freq: Two times a day (BID) | RESPIRATORY_TRACT | 6 refills | Status: AC

## 2024-05-09 NOTE — Progress Notes (Signed)
 Synopsis: Referred in by Myrla Jon HERO, MD   Subjective:   PATIENT ID: Mackenzie Bailey GENDER: female DOB: 1965/12/22, MRN: [id number]  Chief Complaint  Patient presents with   Follow-up    Increased SOB. Wheezing. Cough.    HPI Mackenzie Bailey is a pleasant 58 year old female patient with a past medical history of adult onset asthma, anxiety, hypertension, seasonal allergies presenting today to the pulmonary clinic to establish care.  She reports she was diagnosed with asthma about 6 years ago and saw a pulmonologist at [facility] clinic where she trialed multiple inhalers and none of them worked for her.  She eventually trialed Singulair  that seem to have relieved her symptoms.  She has been on Singulair  for years.  She reports that her asthma usually flares up in the fall and manifest as cough wheezing and chest tightness.  About 3 weeks ago she had an episode of sinusitis and asthma flare and was put on prednisone  which significantly improved her symptoms.  When she tapered off prednisone  her symptoms recurred and is currently finishing her third week of prednisone  taper.  She was never hospitalized for asthma.  She is usually hypersensitive to cold air and high humidity.  She does report allergic rhinitis but no eczema.  Family history -father with asthma  Social history -never smoked -drinks alcohol occasionally -Works at [employer] -lives at home with her husband and son.  Has only 1 dog at home.  OV 05/09/2024 - Mackenzie Bailey is here for an acute visit. I started her on Symbicort  for moderate persistent asthma in June 2025. She did well initially however about 2 weeks ago after spending a long day outdoors at a golf tournament she starting having worsening coughing spells and chest tightness. She presented to the ED and underwent a CTA chest that did not show any PE nor endobronchial lesions. I discussed with her that this might be a flare of her asthma symptoms given recent  intolerable heat and humidity and I will escalate treatment to triple inhaler therapy. I also prescribed her prednisone  for 5 days in case she needs to use it.   ROS All symptoms were reviewed and are negative except for the above.  Objective:   There were no vitals filed for this visit.    on RA BMI Readings from Last 3 Encounters:  04/11/24 35.87 kg/m  04/10/24 35.36 kg/m  04/02/24 35.36 kg/m   Wt Readings from Last 3 Encounters:  04/11/24 209 lb (94.8 kg)  04/02/24 206 lb (93.4 kg)  03/22/24 206 lb (93.4 kg)    Physical Exam GEN: NAD, Healthy Appearing HEENT: Supple Neck, Reactive Pupils, EOMI  CVS: Normal S1, Normal S2, RRR, No murmurs or ES appreciated  Lungs: Clear bilateral air entry.  Abdomen: Soft, non tender, non distended, + BS  Extremities: Warm and well perfused, No edema  Skin: No suspicious lesions appreciated  Psych: Normal Affect  Ancillary Information   CBC    Component Value Date/Time   WBC 18.2 (H) 04/02/2024 1401   RBC 5.80 (H) 04/02/2024 1401   HGB 15.0 04/02/2024 1401   HGB 13.9 03/22/2024 1433   HCT 46.3 (H) 04/02/2024 1401   HCT 43.2 03/22/2024 1433   PLT 363 04/02/2024 1401   PLT 324 03/22/2024 1433   MCV 79.8 (L) 04/02/2024 1401   MCV 80 03/22/2024 1433   MCH 25.9 (L) 04/02/2024 1401   MCHC 32.4 04/02/2024 1401   RDW 15.5 04/02/2024 1401   RDW 14.2  03/22/2024 1433   LYMPHSABS 2.2 06/23/2023 1422   EOSABS 0.1 06/23/2023 1422   BASOSABS 0.0 06/23/2023 1422    CTA chest 03/2024 - Negative for PE. Multiple small pulmonary nodules largest measuring 3mm.      No data to display           Assessment & Plan:  Mackenzie Bailey is a pleasant 58 year old female patient with a past medical history of adult onset asthma, anxiety, hypertension, seasonal allergies presenting today to the pulmonary clinic to establish care.  #Moderate persistent Asthma with asthma flare.  Dx 6 years ago. Sx include chest tightness, cough and wheezing. Flare in  fall.  No PFTs.  FENO 10 negative for intrapulmonary eosinophilic inflammation.  No phenotype  []  CBC w/ Diff + Allergen Panel pending.  []  Obtain PFTs. Pending.  []  Start budesonide -formoterol -glycopyrrolate  [Breztri  160-4.5 2puffs twice a day. Mouth rinse with water  after each use.  []  Levalbuterol  as needed.  []  C/w Singulair  10mg  1 tab daily.    RTC 3 months.   I spent 30 minutes caring for this patient today, including preparing to see the patient, obtaining a medical history , reviewing a separately obtained history, performing a medically appropriate examination and/or evaluation, counseling and educating the patient/family/caregiver, ordering medications, tests, or procedures, documenting clinical information in the electronic health record, and independently interpreting results (not separately reported/billed) and communicating results to the patient/family/caregiver  Darrin Barn, MD [facility] 05/09/2024 2:33 PM

## 2024-06-05 ENCOUNTER — Encounter: Payer: Commercial Managed Care - PPO | Admitting: Dermatology

## 2024-06-25 ENCOUNTER — Ambulatory Visit (INDEPENDENT_AMBULATORY_CARE_PROVIDER_SITE_OTHER): Admitting: Family Medicine

## 2024-06-25 ENCOUNTER — Encounter: Payer: Self-pay | Admitting: Family Medicine

## 2024-06-25 VITALS — BP 122/84 | HR 87 | Ht 64.0 in | Wt 206.8 lb

## 2024-06-25 DIAGNOSIS — E559 Vitamin D deficiency, unspecified: Secondary | ICD-10-CM

## 2024-06-25 DIAGNOSIS — Z0001 Encounter for general adult medical examination with abnormal findings: Secondary | ICD-10-CM

## 2024-06-25 DIAGNOSIS — R0683 Snoring: Secondary | ICD-10-CM

## 2024-06-25 DIAGNOSIS — G4719 Other hypersomnia: Secondary | ICD-10-CM

## 2024-06-25 DIAGNOSIS — I10 Essential (primary) hypertension: Secondary | ICD-10-CM

## 2024-06-25 DIAGNOSIS — R7303 Prediabetes: Secondary | ICD-10-CM

## 2024-06-25 DIAGNOSIS — D72829 Elevated white blood cell count, unspecified: Secondary | ICD-10-CM

## 2024-06-25 DIAGNOSIS — Z6835 Body mass index (BMI) 35.0-35.9, adult: Secondary | ICD-10-CM

## 2024-06-25 DIAGNOSIS — Z1231 Encounter for screening mammogram for malignant neoplasm of breast: Secondary | ICD-10-CM

## 2024-06-25 DIAGNOSIS — Z23 Encounter for immunization: Secondary | ICD-10-CM

## 2024-06-25 DIAGNOSIS — G478 Other sleep disorders: Secondary | ICD-10-CM | POA: Diagnosis not present

## 2024-06-25 DIAGNOSIS — R7989 Other specified abnormal findings of blood chemistry: Secondary | ICD-10-CM

## 2024-06-25 DIAGNOSIS — Z0000 Encounter for general adult medical examination without abnormal findings: Secondary | ICD-10-CM

## 2024-06-25 DIAGNOSIS — J4520 Mild intermittent asthma, uncomplicated: Secondary | ICD-10-CM

## 2024-06-25 DIAGNOSIS — F419 Anxiety disorder, unspecified: Secondary | ICD-10-CM

## 2024-06-25 DIAGNOSIS — E66812 Obesity, class 2: Secondary | ICD-10-CM

## 2024-06-25 DIAGNOSIS — Z789 Other specified health status: Secondary | ICD-10-CM

## 2024-06-25 NOTE — Assessment & Plan Note (Signed)
 Obstructive sleep apnea (suspected) Suspected obstructive sleep apnea based on history of snoring, daytime fatigue, and observed apneic episodes. Discussed the impact of untreated sleep apnea on cardiovascular health and overall well-being. Home sleep studies are considered reliable and more affordable than in-lab studies. Discussed insurance coverage for CPAP and related supplies. - Order home sleep study - Complete Epworth Sleepiness Scale - Follow up on sleep study results if not received within a couple of weeks

## 2024-06-25 NOTE — Assessment & Plan Note (Signed)
 Essential hypertension with recent elevated blood pressure readings. Previous adjustment of antihypertensive medication due to low blood pressure. Current medication includes amlodipine  5 mg. Discussed potential contributing factors including asthma treatment and sleep apnea. - Recheck blood pressure today - Consider increasing amlodipine  if blood pressure remains elevated - Follow up in one month if medication adjustment is needed, otherwise in six months

## 2024-06-25 NOTE — Assessment & Plan Note (Signed)
 anxiety disorder previously treated with sertraline , switched to duloxetine  (Cymbalta ) for mood and anxiety. Discussed the weight-neutral profile of duloxetine  and the potential for mood stabilization. - Restart duloxetine  (Cymbalta ) for mood and anxiety management

## 2024-06-25 NOTE — Patient Instructions (Signed)
 Call [facility] to schedule a mammogram [phone]

## 2024-06-25 NOTE — Assessment & Plan Note (Signed)
F/b pulm 

## 2024-06-25 NOTE — Assessment & Plan Note (Signed)
 Prediabetes with previous A1c of 5.7. Discussed the threshold for diabetes diagnosis and the potential use of GLP-1 receptor agonists if covered by insurance. - Recheck A1c today

## 2024-06-25 NOTE — Assessment & Plan Note (Signed)
 Vitamin D  deficiency with previous labs indicating low vitamin D  levels. - Recheck vitamin D  levels today

## 2024-06-25 NOTE — Progress Notes (Signed)
 Complete physical exam   Patient: Mackenzie Bailey   DOB: Apr 26, 1966   58 y.o. Female  MRN: [id number] Visit Date: 06/25/2024  Today's healthcare provider: Jon Eva, MD   Chief Complaint  Patient presents with   Immunizations    Influenza - will receive at work Covid - would like rx sent ot [facility] Prior Pneumococcal - would like information Hepatitis B - would like titer   Annual Exam    Last completed  Diet -  Well balanced Exercise - tries to go walking at work but difficult with asthma flare ups Feeling - very tired Sleeping - thinks she is but believes she may need a sleep study  Concerns -  weight and sleep study   Subjective    Mackenzie Bailey is a 58 y.o. female who presents today for a complete physical exam.    Discussed the use of AI scribe software for clinical note transcription with the patient, who gave verbal consent to proceed.  History of Present Illness   Mackenzie Bailey is a 58 year old female with hypertension and asthma who presents for a wellness visit and evaluation of sleep apnea.  She experiences snoring and episodes of apnea, impacting her sleep quality. 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 She previously attempted a sleep study but was deterred by the cost. She uses multiple fans at night, which necessitates sleeping in a separate room from her husband.  She is on amlodipine  5 mg for hypertension. Her blood pressure was low at one point, and her asthma management with prednisone  may affect it. She uses Nyquil at night, which can raise blood pressure.  Her asthma is managed with intermittent prednisone  use. She discontinued trazodone  for sleep due to morning grogginess.  She has prediabetes with an A1c of 5.7 and is concerned about her thyroid function, previously noted to be abnormal.           06/25/2024    3:32 PM 04/10/2024    2:26 PM 03/22/2024    1:58 PM 12/16/2023    8:10 AM  06/23/2023    1:31 PM  Depression screen PHQ 2/9  Decreased Interest 1 2 1  0 0  Down, Depressed, Hopeless 0 1 1 0 0  PHQ - 2 Score 1 3 2  0 0  Altered sleeping 2 2 2 2 3   Tired, decreased energy 3 1 3 2 3   Change in appetite 3 1 0 3 2  Feeling bad or failure about yourself  0 0 1 0 0  Trouble concentrating 2 0 0 0 2  Moving slowly or fidgety/restless 0 0 0 0 0  Suicidal thoughts 0 0 0 0 0  PHQ-9 Score 11 7 8 7 10   Difficult doing work/chores Somewhat difficult Not difficult at all Not difficult at all Not difficult at all Somewhat difficult       06/25/2024    3:32 PM 04/10/2024    2:28 PM 03/22/2024    1:59 PM 12/16/2023    8:10 AM  GAD 7 : Generalized Anxiety Score  Nervous, Anxious, on Edge 1 0 1 0  Control/stop worrying 0 0 0 0  Worry too much - different things 0 0 1 1  Trouble relaxing 1 0 0 1  Restless 0 0 0 0  Easily annoyed or irritable 2 1 0 1  Afraid - awful might happen 1 0 1 1  Total GAD 7 Score 5 1 3  4  Anxiety Difficulty Somewhat difficult Not difficult at all Not difficult at all Not difficult at all      Last depression screening scores    06/25/2024    3:32 PM 04/10/2024    2:26 PM 03/22/2024    1:58 PM  PHQ 2/9 Scores  PHQ - 2 Score 1 3 2   PHQ- 9 Score 11 7 8    Last fall risk screening    12/16/2023    8:10 AM  Fall Risk   Falls in the past year? 1  Number falls in past yr: 0  Injury with Fall? 1  Risk for fall due to : History of fall(s)  Follow up Falls evaluation completed        Medications: Outpatient Medications Prior to Visit  Medication Sig   albuterol  (PROVENTIL ) (2.5 MG/3ML) 0.083% nebulizer solution Take 3 mLs (2.5 mg total) by nebulization every 6 (six) hours as needed for wheezing or shortness of breath.   albuterol  (VENTOLIN  HFA) 108 (90 Base) MCG/ACT inhaler Inhale 2 puffs into the lungs every 6 (six) hours as needed for wheezing or shortness of breath.   albuterol  (VENTOLIN  HFA) 108 (90 Base) MCG/ACT inhaler Inhale 2 puffs into  the lungs every 6 (six) hours as needed for wheezing or shortness of breath.   ALPRAZolam  (XANAX ) 1 MG tablet TAKE 1 TABLET (1 MG TOTAL) BY MOUTH DAILY AS NEEDED FOR ANXIETY   amLODipine  (NORVASC ) 5 MG tablet TAKE 1 TABLET (5 MG TOTAL) BY MOUTH DAILY.   budesonide -glycopyrrolate -formoterol  (BREZTRI  AEROSPHERE) 160-9-4.8 MCG/ACT AERO inhaler Inhale 2 puffs into the lungs in the morning and at bedtime.   cholecalciferol (VITAMIN D3) 25 MCG (1000 UNIT) tablet Take 1,000 Units by mouth daily.   colesevelam (WELCHOL) 625 MG tablet Take 625 mg by mouth.   cyclobenzaprine  (FLEXERIL ) 5 MG tablet TAKE 1 TABLET BY MOUTH THREE TIMES A DAY AS NEEDED FOR MUSCLE SPASMS   diphenhydramine-acetaminophen  (TYLENOL  PM) 25-500 MG TABS tablet Take 1-2 tablets by mouth at bedtime as needed (sleep).   DULoxetine  (CYMBALTA ) 60 MG capsule TAKE 1 CAPSULE BY MOUTH EVERY DAY   esomeprazole (NEXIUM) 20 MG capsule Take 20 mg by mouth daily as needed (heart burn).   ipratropium (ATROVENT ) 0.02 % nebulizer solution Take 2.5 mLs (0.5 mg total) by nebulization every 6 (six) hours as needed for wheezing or shortness of breath.   meclizine  (ANTIVERT ) 25 MG tablet Take 1 tablet (25 mg total) by mouth 3 (three) times daily as needed for dizziness.   montelukast  (SINGULAIR ) 10 MG tablet TAKE 1 TABLET BY MOUTH EVERYDAY AT BEDTIME   ondansetron  (ZOFRAN ) 4 MG tablet Take 1 tablet (4 mg total) by mouth every 8 (eight) hours as needed for nausea or vomiting.   oxybutynin  (DITROPAN -XL) 5 MG 24 hr tablet TAKE 1 TABLET BY MOUTH EVERYDAY AT BEDTIME   traZODone  (DESYREL ) 50 MG tablet TAKE 0.5-1 TABLETS (25-50 MG TOTAL) BY MOUTH AT BEDTIME AS NEEDED. FOR SLEEP   tretinoin  (RETIN-A ) 0.025 % cream APPLY TO AFFECTED AREA EVERY DAY AT BEDTIME   No facility-administered medications prior to visit.    Review of Systems    Objective    BP 122/84   Pulse 87   Ht 5' 4 (1.626 m)   Wt 206 lb 12.8 oz (93.8 kg)   LMP 05/09/2019 (Exact Date)   SpO2  96%   BMI 35.50 kg/m    Physical Exam Vitals reviewed.  Constitutional:      General: She is not in acute  distress.    Appearance: Normal appearance. She is well-developed. She is not diaphoretic.  HENT:     Head: Normocephalic and atraumatic.     Right Ear: Tympanic membrane, ear canal and external ear normal.     Left Ear: Tympanic membrane, ear canal and external ear normal.     Nose: Nose normal.     Mouth/Throat:     Mouth: Mucous membranes are moist.     Pharynx: Oropharynx is clear. No oropharyngeal exudate.  Eyes:     General: No scleral icterus.    Conjunctiva/sclera: Conjunctivae normal.     Pupils: Pupils are equal, round, and reactive to light.  Neck:     Thyroid: No thyromegaly.  Cardiovascular:     Rate and Rhythm: Normal rate and regular rhythm.     Heart sounds: Normal heart sounds. No murmur heard. Pulmonary:     Effort: Pulmonary effort is normal. No respiratory distress.     Breath sounds: Normal breath sounds. No wheezing or rales.  Abdominal:     General: There is no distension.     Palpations: Abdomen is soft.     Tenderness: There is no abdominal tenderness.  Musculoskeletal:        General: No deformity.     Cervical back: Neck supple.     Right lower leg: No edema.     Left lower leg: No edema.  Lymphadenopathy:     Cervical: No cervical adenopathy.  Skin:    General: Skin is warm and dry.     Findings: No rash.  Neurological:     Mental Status: She is alert and oriented to person, place, and time. Mental status is at baseline.     Gait: Gait normal.  Psychiatric:        Mood and Affect: Mood normal.        Behavior: Behavior normal.        Thought Content: Thought content normal.      No results found for any visits on 06/25/24.  Assessment & Plan    Routine Health Maintenance and Physical Exam  Exercise Activities and Dietary recommendations  Goals   None     Immunization History  Administered Date(s) Administered    Influenza,inj,Quad PF,6+ Mos 08/13/2013   Influenza-Unspecified 08/16/2018, 07/20/2019, 07/21/2022, 07/19/2023   Moderna Sars-Covid-2 Vaccination 10/29/2019, 11/26/2019   PNEUMOCOCCAL CONJUGATE-20 06/25/2024   Pfizer Covid-19 Vaccine Bivalent Booster 28yrs & up 07/09/2021   Tdap 01/14/2020   Zoster Recombinant(Shingrix) 04/07/2021, 10/20/2021, 01/07/2023, 04/29/2023    Health Maintenance  Topic Date Due   Hepatitis B Vaccines 19-59 Average Risk (1 of 3 - 19+ 3-dose series) Never done   Colonoscopy  02/25/2023   COVID-19 Vaccine (4 - 2025-26 season) 06/18/2024   Influenza Vaccine  01/15/2025 (Originally 05/18/2024)   MAMMOGRAM  01/13/2025   DTaP/Tdap/Td (2 - Td or Tdap) 01/13/2030   Pneumococcal Vaccine: 50+ Years  Completed   Hepatitis C Screening  Completed   HIV Screening  Completed   Zoster Vaccines- Shingrix  Completed   HPV VACCINES  Aged Out   Meningococcal B Vaccine  Aged Out    Discussed health benefits of physical activity, and encouraged her to engage in regular exercise appropriate for her age and condition.  Problem List Items Addressed This Visit       Cardiovascular and Mediastinum   Essential hypertension   Essential hypertension with recent elevated blood pressure readings. Previous adjustment of antihypertensive medication due to low blood pressure. Current  medication includes amlodipine  5 mg. Discussed potential contributing factors including asthma treatment and sleep apnea. - Recheck blood pressure today - Consider increasing amlodipine  if blood pressure remains elevated - Follow up in one month if medication adjustment is needed, otherwise in six months      Relevant Orders   Lipid panel     Respiratory   Asthma   F/b pulm        Other   Anxiety   anxiety disorder previously treated with sertraline , switched to duloxetine  (Cymbalta ) for mood and anxiety. Discussed the weight-neutral profile of duloxetine  and the potential for mood stabilization. -  Restart duloxetine  (Cymbalta ) for mood and anxiety management       Obesity   Class 2 obesity with interest in weight management options. Discussed the potential use of GLP-1 receptor agonists for weight loss, contingent on insurance coverage. Explored the possibility of insurance coverage for obesity, prediabetes, and sleep apnea. Discussed the potential side effects of GLP-1 receptor agonists, including nausea, and the availability of anti-nausea medications if needed. Discussed alternative weight management medications if GLP-1 receptor agonists are not covered. - Check insurance coverage for GLP-1 receptor agonists for obesity, prediabetes, and sleep apnea - Consider alternative weight management medications if GLP-1 receptor agonists are not covered      Relevant Orders   Lipid panel   Excessive daytime sleepiness   Obstructive sleep apnea (suspected) Suspected obstructive sleep apnea based on history of snoring, daytime fatigue, and observed apneic episodes. Discussed the impact of untreated sleep apnea on cardiovascular health and overall well-being. Home sleep studies are considered reliable and more affordable than in-lab studies. Discussed insurance coverage for CPAP and related supplies. - Order home sleep study - Complete Epworth Sleepiness Scale - Follow up on sleep study results if not received within a couple of weeks      Avitaminosis D   Vitamin D  deficiency with previous labs indicating low vitamin D  levels. - Recheck vitamin D  levels today      Relevant Orders   VITAMIN D  25 Hydroxy (Vit-D Deficiency, Fractures)   Snoring   Relevant Orders   Ambulatory referral to Sleep Studies   Prediabetes   Prediabetes with previous A1c of 5.7. Discussed the threshold for diabetes diagnosis and the potential use of GLP-1 receptor agonists if covered by insurance. - Recheck A1c today      Relevant Orders   Hemoglobin A1c   Other Visit Diagnoses       Encounter for annual  physical exam    -  Primary   Relevant Orders   Hepatitis B Surface AntiBODY   MM 3D SCREENING MAMMOGRAM BILATERAL BREAST   TSH + free T4   VITAMIN D  25 Hydroxy (Vit-D Deficiency, Fractures)   CBC w/Diff/Platelet   Lipid panel   Hemoglobin A1c     Non-restorative sleep       Relevant Orders   Ambulatory referral to Sleep Studies     Hepatitis B vaccination status unknown       Relevant Orders   Hepatitis B Surface AntiBODY     Breast cancer screening by mammogram       Relevant Orders   MM 3D SCREENING MAMMOGRAM BILATERAL BREAST     Leukocytosis, unspecified type       Relevant Orders   CBC w/Diff/Platelet     Abnormal TSH       Relevant Orders   TSH + free T4     Immunization due  Relevant Orders   Pneumococcal conjugate vaccine 20-valent (Prevnar 20) (Completed)           Adult Wellness Visit Routine wellness visit to discuss general health maintenance, screenings, and vaccinations. Addressed the need for a mammogram and the benefits of the pneumonia vaccine, especially given her asthma and age. Discussed the current situation regarding the COVID vaccine. - Administer pneumonia vaccine today - Send order for mammogram to [place] - Recheck blood pressure today - Perform labs including thyroid function, vitamin D , blood counts, cholesterol, and A1c - Check hep B titer - Follow up on colonoscopy records from 2023  Hiatal hernia Presence of a small hiatal hernia contributing to symptoms of fullness and occasional vomiting. Discussed the anatomical basis of hiatal hernia and the p otential for symptoms to be exacerbated by overeating. Surgery is not indicated unless symptoms are severe. - Monitor symptoms and avoid overeating to prevent exacerbation      Return in about 6 months (around 12/23/2024) for chronic disease f/u.     Jon Eva, MD  [facility] [phone] (phone) [phone] (fax)  [facility]

## 2024-06-25 NOTE — Assessment & Plan Note (Signed)
 Class 2 obesity with interest in weight management options. Discussed the potential use of GLP-1 receptor agonists for weight loss, contingent on insurance coverage. Explored the possibility of insurance coverage for obesity, prediabetes, and sleep apnea. Discussed the potential side effects of GLP-1 receptor agonists, including nausea, and the availability of anti-nausea medications if needed. Discussed alternative weight management medications if GLP-1 receptor agonists are not covered. - Check insurance coverage for GLP-1 receptor agonists for obesity, prediabetes, and sleep apnea - Consider alternative weight management medications if GLP-1 receptor agonists are not covered

## 2024-06-26 ENCOUNTER — Encounter: Payer: Self-pay | Admitting: Family Medicine

## 2024-06-26 ENCOUNTER — Ambulatory Visit: Payer: Self-pay | Admitting: Family Medicine

## 2024-06-26 DIAGNOSIS — E559 Vitamin D deficiency, unspecified: Secondary | ICD-10-CM

## 2024-06-26 LAB — VITAMIN D 25 HYDROXY (VIT D DEFICIENCY, FRACTURES): Vit D, 25-Hydroxy: 26.1 ng/mL — ABNORMAL LOW (ref 30.0–100.0)

## 2024-06-26 LAB — CBC WITH DIFFERENTIAL/PLATELET
Basophils Absolute: 0 x10E3/uL (ref 0.0–0.2)
Basos: 0 %
EOS (ABSOLUTE): 0 x10E3/uL (ref 0.0–0.4)
Eos: 0 %
Hematocrit: 44.4 % (ref 34.0–46.6)
Hemoglobin: 14 g/dL (ref 11.1–15.9)
Immature Grans (Abs): 0 x10E3/uL (ref 0.0–0.1)
Immature Granulocytes: 0 %
Lymphocytes Absolute: 2.7 x10E3/uL (ref 0.7–3.1)
Lymphs: 28 %
MCH: 25.8 pg — ABNORMAL LOW (ref 26.6–33.0)
MCHC: 31.5 g/dL (ref 31.5–35.7)
MCV: 82 fL (ref 79–97)
Monocytes Absolute: 0.6 x10E3/uL (ref 0.1–0.9)
Monocytes: 6 %
Neutrophils Absolute: 6.3 x10E3/uL (ref 1.4–7.0)
Neutrophils: 66 %
Platelets: 259 x10E3/uL (ref 150–450)
RBC: 5.42 x10E6/uL — ABNORMAL HIGH (ref 3.77–5.28)
RDW: 15 % (ref 11.7–15.4)
WBC: 9.7 x10E3/uL (ref 3.4–10.8)

## 2024-06-26 LAB — TSH+FREE T4
Free T4: 1.36 ng/dL (ref 0.82–1.77)
TSH: 1.22 u[IU]/mL (ref 0.450–4.500)

## 2024-06-26 LAB — LIPID PANEL
Chol/HDL Ratio: 3.1 ratio (ref 0.0–4.4)
Cholesterol, Total: 207 mg/dL — ABNORMAL HIGH (ref 100–199)
HDL: 67 mg/dL (ref >39)
LDL Chol Calc (NIH): 115 mg/dL — ABNORMAL HIGH (ref 0–99)
Triglycerides: 143 mg/dL (ref 0–149)
VLDL Cholesterol Cal: 25 mg/dL (ref 5–40)

## 2024-06-26 LAB — HEPATITIS B SURFACE ANTIBODY,QUALITATIVE: Hep B Surface Ab, Qual: NONREACTIVE

## 2024-06-26 LAB — HEMOGLOBIN A1C
Est. average glucose Bld gHb Est-mCnc: 143 mg/dL
Hgb A1c MFr Bld: 6.6 % — ABNORMAL HIGH (ref 4.8–5.6)

## 2024-06-26 NOTE — Progress Notes (Signed)
 Seen by patient Mackenzie Bailey on 06/26/2024  1:56 PM

## 2024-06-26 NOTE — Telephone Encounter (Signed)
 See result note regarding this.

## 2024-06-27 ENCOUNTER — Ambulatory Visit (INDEPENDENT_AMBULATORY_CARE_PROVIDER_SITE_OTHER)

## 2024-06-27 DIAGNOSIS — L578 Other skin changes due to chronic exposure to nonionizing radiation: Secondary | ICD-10-CM

## 2024-06-27 DIAGNOSIS — D225 Melanocytic nevi of trunk: Secondary | ICD-10-CM | POA: Diagnosis not present

## 2024-06-27 DIAGNOSIS — D485 Neoplasm of uncertain behavior of skin: Secondary | ICD-10-CM

## 2024-06-27 DIAGNOSIS — Z1283 Encounter for screening for malignant neoplasm of skin: Secondary | ICD-10-CM

## 2024-06-27 DIAGNOSIS — D229 Melanocytic nevi, unspecified: Secondary | ICD-10-CM

## 2024-06-27 DIAGNOSIS — W908XXA Exposure to other nonionizing radiation, initial encounter: Secondary | ICD-10-CM

## 2024-06-27 DIAGNOSIS — L814 Other melanin hyperpigmentation: Secondary | ICD-10-CM | POA: Diagnosis not present

## 2024-06-27 DIAGNOSIS — D1801 Hemangioma of skin and subcutaneous tissue: Secondary | ICD-10-CM

## 2024-06-27 DIAGNOSIS — L821 Other seborrheic keratosis: Secondary | ICD-10-CM | POA: Diagnosis not present

## 2024-06-27 DIAGNOSIS — D492 Neoplasm of unspecified behavior of bone, soft tissue, and skin: Secondary | ICD-10-CM

## 2024-06-27 DIAGNOSIS — Z808 Family history of malignant neoplasm of other organs or systems: Secondary | ICD-10-CM

## 2024-06-27 DIAGNOSIS — Z86018 Personal history of other benign neoplasm: Secondary | ICD-10-CM

## 2024-06-27 MED ORDER — VITAMIN D (ERGOCALCIFEROL) 1.25 MG (50000 UNIT) PO CAPS: 50000.0000 [IU] | ORAL_CAPSULE | ORAL | 1 refills | Status: DC

## 2024-06-27 NOTE — Progress Notes (Signed)
 Subjective   Mackenzie Bailey is a 58 y.o. female who presents for the following: Total body skin exam for skin cancer screening and mole check. The patient has spots, moles and lesions to be evaluated, some may be new or changing and the patient may have concern these could be cancer.. Patient is established patient   Today patient reports: Hx multiple dysplastic moles.  No LOC   Review of Systems:    No other skin or systemic complaints except as noted in HPI or Assessment and Plan.  The following portions of the chart were reviewed this encounter and updated as appropriate: medications, allergies, medical history  Relevant Medical History:  Personal history of non melanoma skin cancer - see medical history for full details   Objective  Well appearing patient in no apparent distress; mood and affect are within normal limits. Examination was performed of the: Full Skin Examination: scalp, head, eyes, ears, nose, lips, neck, chest, axillae, abdomen, back, buttocks, bilateral upper extremities, bilateral lower extremities, hands, feet, fingers, toes, fingernails, and toenails.   Examination notable for: Angioma(s): Scattered red vascular papule(s)  , Lentigo/lentigines: Scattered pigmented macules that are tan to brown in color and are somewhat non-uniform in shape and concentrated in the sun-exposed areas, Nevus/nevi: Scattered well-demarcated, regular, pigmented macule(s) and/or papule(s)  , Seborrheic Keratosis(es): Stuck-on appearing keratotic papule(s) on the trunk, many irritated with redness, crusting, edema, and/or partial avulsion, and Actinic Damage/Elastosis: chronic sun damage: dyspigmentation, telangiectasia, and wrinkling        Central mid back 6 x 5 mm irregular brown papule   Assessment & Plan   SKIN CANCER SCREENING PERFORMED TODAY.  BENIGN SKIN FINDINGS  - Lentigines  - Seborrheic keratoses  - Hemangiomas   - Nevus/Multiple Benign Nevi  - Reassurance  provided regarding the benign appearance of lesions noted on exam today; no treatment is indicated in the absence of symptoms/changes. - Reinforced importance of photoprotective strategies including liberal and frequent sunscreen use of a broad-spectrum SPF 30 or greater, use of protective clothing, and sun avoidance for prevention of cutaneous malignancy and photoaging.  Counseled patient on the importance of regular self-skin monitoring as well as routine clinical skin examinations as scheduled.   ACTINIC DAMAGE - Chronic condition, secondary to cumulative UV/sun exposure - Recommend daily broad spectrum sunscreen SPF 30+ to sun-exposed areas, reapply every 2 hours as needed.  - Staying in the shade or wearing long sleeves, sun glasses (UVA+UVB protection) and wide brim hats (4-inch brim around the entire circumference of the hat) are also recommended for sun protection.  - Call for new or changing lesions.  Personal history of dysplastic nevi  - Reviewed medical history for full details  - Reviewed sun protective measures as above - Encouraged full body skin exams     FAMILY HISTORY OF SKIN CANCER What type(s): Melanoma Who affected: Father  Procedures, orders, diagnosis for this visit:  NEOPLASM OF SKIN Central mid back Epidermal / dermal shaving  Informed consent: discussed and consent obtained   Timeout: patient name, date of birth, surgical site, and procedure verified   Procedure prep:  Patient was prepped and draped in usual sterile fashion Prep type:  Isopropyl alcohol Anesthesia: the lesion was anesthetized in a standard fashion   Anesthetic:  1% lidocaine  w/ epinephrine  1-100,000 buffered w/ 8.4% NaHCO3 Instrument used: DermaBlade   Hemostasis achieved with: pressure and aluminum chloride   Outcome: patient tolerated procedure well   Post-procedure details: wound care instructions given  Specimen 1 - Surgical pathology Differential Diagnosis: R/o dysplastic vs  melanoma  Check Margins: No 6 x 5 mm irregular brown papule  Neoplasm of skin -     Epidermal / dermal shaving -     Surgical pathology; Standing    Return to clinic: Return in about 1 year (around 06/27/2025) for TBSE, HxDysplastic Nevi.  Documentation: I have reviewed the above documentation for accuracy and completeness, and I agree with the above.  Lauraine JAYSON Kanaris, MD

## 2024-06-27 NOTE — Patient Instructions (Addendum)
 The bandage should remain in place until tomorrow. Remove the bandages and clean the wound once daily as follows:  - Wash your hands. Clean the wound gently with soap and water , and then pat dry. Do not rub. Apply a small amount of Petroleum Jelly or Vaseline. Cover the area with a Band-Aid.  A small amount of bleeding is normal. If bleeding persists, apply firm pressure over the bandage for 5 to 10 minutes without interruption. If bleeding continues, call our office. Continue to clean the area as directed above until the wound is healed. Shave biopsies may take several weeks to heal. It is normal if the edges are pink/red and the center is slight yellowish or white in color. However if the site becomes hot, swollen, has a thick drainage or redness that expands away from the site please call us . We will contact you with results once available.    Melanoma ABCDEs  Melanoma is the most dangerous type of skin cancer, and is the leading cause of death from skin disease.  You are more likely to develop melanoma if you: Have light-colored skin, light-colored eyes, or red or blond hair Spend a lot of time in the sun Tan regularly, either outdoors or in a tanning bed Have had blistering sunburns, especially during childhood Have a close family member who has had a melanoma Have atypical moles or large birthmarks  Early detection of melanoma is key since treatment is typically straightforward and cure rates are extremely high if we catch it early.   The first sign of melanoma is often a change in a mole or a new dark spot.  The ABCDE system is a way of remembering the signs of melanoma.  A for asymmetry:  The two halves do not match. B for border:  The edges of the growth are irregular. C for color:  A mixture of colors are present instead of an even brown color. D for diameter:  Melanomas are usually (but not always) greater than 6mm - the size of a pencil eraser. E for evolution:  The spot keeps  changing in size, shape, and color.  Please check your skin once per month between visits. You can use a small mirror in front and a large mirror behind you to keep an eye on the back side or your body.   If you see any new or changing lesions before your next follow-up, please call to schedule a visit.  Please continue daily skin protection including broad spectrum sunscreen SPF 30+ to sun-exposed areas, reapplying every 2 hours as needed when you're outdoors.   Staying in the shade or wearing long sleeves, sun glasses (UVA+UVB protection) and wide brim hats (4-inch brim around the entire circumference of the hat) are also recommended for sun protection.    Sunscreen  Who needs sunscreen? Everyone. Sunscreen use can help prevent skin cancer by protecting you from the sun's harmful ultraviolet rays. Anyone can get skin cancer, regardless of age, gender or race. In fact, it is estimated that one in five Americans will develop skin cancer in their lifetime.  Sunscreen alone cannot fully protect you. In addition to wearing sunscreen, dermatologists recommend taking the following steps to protect your skin and find skin cancer early:  Seek shade when appropriate, remembering that the sun's rays are strongest between 10 a.m. and 2 p.m. If your shadow is shorter than you are, seek shade. Dress to protect yourself from the sun by wearing a lightweight long-sleeved shirt, pants, a  wide-brimmed hat and sunglasses, when possible.  Use extra caution near water , snow and sand as they reflect the damaging rays of the sun, which can increase your chance of sunburn.  Get vitamin D  safely through a healthy diet that may include vitamin supplements. Don't seek the sun. Avoid tanning beds. Ultraviolet light from the sun and tanning beds can cause skin cancer and wrinkling. If you want to look tan, you may wish to use a self-tanning product, but continue to use sunscreen with it.  When should I use sunscreen?  Every day you go outside--even if you're just walking to and from your form of transportation. The sun emits harmful UV rays year-round. Even on cloudy days, up to 80 percent of the sun's harmful UV rays can penetrate your skin. Snow, sand and water  increase the need for sunscreen because they reflect the sun's rays.  How much sunscreen should I use, and how often should I apply it? Most people only apply 25-50 percent of the recommended amount of sunscreen. Apply enough sunscreen to cover all exposed skin. Most adults need about 1 ounce -- or enough to fill a shot glass -- to fully cover their body.  Don't forget to apply to the tops of your feet, your neck, your ears and the top of your head. Apply sunscreen to dry skin 15 minutes before going outdoors.  Skin cancer also can form on the lips. To protect your lips, apply a lip balm or lipstick that contains sunscreen with an SPF of 30 or higher.  When outdoors, reapply sunscreen approximately every two hours, or after swimming or sweating, according to the directions on the bottle.   Broad-spectrum sunscreens protect against both UVA and UVB rays. What is the difference between the rays? Sunlight consists of two types of harmful rays that reach the earth -- UVA rays and UVB rays. Overexposure to either can lead to skin cancer. In addition to causing skin cancer, here's what each of these rays do:  UVA rays (or aging rays) can prematurely age your skin, causing wrinkles and age spots, and can pass through window glass. UVB rays (or burning rays) are the primary cause of sunburn and are blocked by window glass  There is no safe way to tan. Every time you tan, you damage your skin. As this damage builds, you speed up the aging of your skin and increase your risk for all types of skin cancer.  What is the difference between chemical and physical sunscreens? Chemical sunscreens work like a sponge, absorbing the sun's rays. They contain one or more of the  following active ingredients: oxybenzone, avobenzone, octisalate, octocrylene, homosalate and octinoxate. These formulations tend to be easier to rub into the skin without leaving a white residue.   Physical sunscreens work like a shield, sitting sit on the surface of your skin and deflecting the sun's rays. They contain the active ingredients zinc oxide and/or titanium dioxide. Use this sunscreen if you have sensitive skin.   What type of sunscreen should I use? The best type of sunscreen is the one you will use again and again. Just make sure it offers broad-spectrum (UVA and UVB) protection, has an SPF of 30+, and is water -resistant. The kind of sunscreen you use is a matter of personal choice, and may vary depending on the area of the body to be protected. Available sunscreen options include lotions, creams, gels, ointments, wax sticks and sprays.  Recommended physical sunscreens for face: - Neutrogena Sheer Zinc -  Aveeno Positively Mineral Sensitive - CeraVe Hydrating Mineral (also has a tinted version) - La Roche-Posay Anthelios Mineral Face (comes as a cream, lotion, light fluid, and there is also a tinted version).  - EltaMD UV Clear (also has a tinted version)  Recommended physical sunscreens for body: - Neutrogena Sheer Zinc Dry-Touch Sunscreen Sensitive Skin Lotion Broad Spectrum SPF 50 - Aveeno Positively Mineral Sensitive Skin Sunscreen Broad Spectrum SPF 50 - La Roche-Posay Anthelios SPF 50 Mineral Sunscreen - Gentle Lotion - CeraVe Hydrating Mineral Sunscreen SPF 50  Recommended chemical sunscreens for face: - Anthelios UV Correct Face Sunscreen SPF 70 with Niacinamide - Neutrogena Clear Face Oil-Free SPF 50 with Helioplex - Neutrogena Sport Face Oil-Free SPF 70+ with Helioplex - Aveeno Protect + Hydrate Sunscreen For Face SPF 70 - La Roche-Posay Anthelios Light Fluid Sunscreen for Face SPF 60  Recommended chemical sunscreens for body: - Neutrogena Ultra Sheer Dry-Touch  Sunscreen SPF 70 - Aveeno Protect + Hydrate Broad Spectrum All-Day Hydration SPF 60 (comes in a big pump) - La Roche-Posay Anthelios Melt-In Milk Sunscreen SPF 60    Due to recent changes in healthcare laws, you may see results of your pathology and/or laboratory studies on MyChart before the doctors have had a chance to review them. We understand that in some cases there may be results that are confusing or concerning to you. Please understand that not all results are received at the same time and often the doctors may need to interpret multiple results in order to provide you with the best plan of care or course of treatment. Therefore, we ask that you please give us  2 business days to thoroughly review all your results before contacting the office for clarification. Should we see a critical lab result, you will be contacted sooner.   If You Need Anything After Your Visit  If you have any questions or concerns for your doctor, please call our main line at [phone] and press option 4 to reach your doctor's medical assistant. If no one answers, please leave a voicemail as directed and we will return your call as soon as possible. Messages left after 4 pm will be answered the following business day.   You may also send us  a message via MyChart. We typically respond to MyChart messages within 1-2 business days.  For prescription refills, please ask your pharmacy to contact our office. Our fax number is [phone].  If you have an urgent issue when the clinic is closed that cannot wait until the next business day, you can page your doctor at the number below.    Please note that while we do our best to be available for urgent issues outside of office hours, we are not available 24/7.   If you have an urgent issue and are unable to reach us , you may choose to seek medical care at your doctor's office, retail clinic, urgent care center, or emergency room.  If you have a medical emergency,  please immediately call 911 or go to the emergency department.  Pager Numbers  - Dr. Hester: [phone]  - Dr. Jackquline: [phone]  - Dr. Claudene: [phone]   In the event of inclement weather, please call our main line at [phone] for an update on the status of any delays or closures.  Dermatology Medication Tips: Please keep the boxes that topical medications come in in order to help keep track of the instructions about where and how to use these. Pharmacies typically print the medication instructions only  on the boxes and not directly on the medication tubes.   If your medication is too expensive, please contact our office at [phone] option 4 or send us  a message through MyChart.   We are unable to tell what your co-pay for medications will be in advance as this is different depending on your insurance coverage. However, we may be able to find a substitute medication at lower cost or fill out paperwork to get insurance to cover a needed medication.   If a prior authorization is required to get your medication covered by your insurance company, please allow us  1-2 business days to complete this process.  Drug prices often vary depending on where the prescription is filled and some pharmacies may offer cheaper prices.  The website www.goodrx.com contains coupons for medications through different pharmacies. The prices here do not account for what the cost may be with help from insurance (it may be cheaper with your insurance), but the website can give you the price if you did not use any insurance.  - You can print the associated coupon and take it with your prescription to the pharmacy.  - You may also stop by our office during regular business hours and pick up a GoodRx coupon card.  - If you need your prescription sent electronically to a different pharmacy, notify our office through [facility] or by phone at [phone] option 4.     Si Usted Necesita  Algo Despus de Su Visita  Tambin puede enviarnos un mensaje a travs de Clinical cytogeneticist. Por lo general respondemos a los mensajes de MyChart en el transcurso de 1 a 2 das hbiles.  Para renovar recetas, por favor pida a su farmacia que se ponga en contacto con nuestra oficina. [place]i [place] de fax es [place] [phone].  Si tiene un asunto urgente cuando la clnica est cerrada y que no puede esperar hasta el siguiente da hbil, puede llamar/localizar a su doctor(a) al nmero que aparece a continuacin.   Por favor, tenga en cuenta que aunque hacemos todo lo posible para estar disponibles para asuntos urgentes fuera del horario de [place], no estamos disponibles las 24 horas del da, los 7 [address]  [address] de la [place].   Si tiene un problema urgente y no puede comunicarse con nosotros, puede optar por buscar atencin mdica  en el consultorio de su doctor(a), en una clnica privada, en un centro de atencin urgente o en una sala de emergencias.  Si tiene Engineer, drilling, por favor llame inmediatamente al 911 o vaya a la sala de emergencias.  Nmeros de bper  - Dr. Hester: [phone]  - Dra. Jackquline: [phone]  - Dr. Claudene: [phone]   En caso de inclemencias del tiempo, por favor llame a landry capes principal al [phone] para una actualizacin sobre el [place] de cualquier retraso o cierre.  Consejos para la medicacin en dermatologa: Por favor, guarde las cajas en las que vienen los medicamentos de uso tpico para ayudarle a seguir las instrucciones sobre dnde y cmo usarlos. Las farmacias generalmente imprimen las instrucciones del medicamento slo en las cajas y no directamente en los tubos del [place].   Si su medicamento es muy caro, por favor, pngase en contacto con landry rieger llamando al [phone] y presione la opcin 4 o envenos un mensaje a travs de Clinical cytogeneticist.   No podemos decirle cul ser su copago por los medicamentos por adelantado ya que esto es  diferente dependiendo de la cobertura de su seguro. Sin embargo, es  posible que podamos encontrar un medicamento sustituto a Audiological scientist un formulario para que el seguro cubra el medicamento que se considera necesario.   Si se requiere una autorizacin previa para que su compaa de seguros malta su medicamento, por favor permtanos de 1 a 2 das hbiles para completar este proceso.  Los precios de los medicamentos varan con frecuencia dependiendo del Environmental consultant de dnde se surte la receta y alguna farmacias pueden ofrecer precios ms baratos.  El sitio web www.goodrx.com tiene cupones para medicamentos de Health and safety inspector. Los precios aqu no tienen en cuenta lo que podra costar con la ayuda del seguro (puede ser ms barato con su seguro), pero el sitio web puede darle el precio si no utiliz Tourist information centre manager.  - Puede imprimir el cupn correspondiente y llevarlo con su receta a la farmacia.  - Tambin puede pasar por nuestra oficina durante el horario de atencin regular y Education officer, museum una tarjeta de cupones de GoodRx.  - Si necesita que su receta se enve electrnicamente a una farmacia diferente, informe a nuestra oficina a travs de MyChart de [place] o por telfono llamando al [phone] y presione la opcin 4.

## 2024-06-28 LAB — SURGICAL PATHOLOGY

## 2024-06-29 ENCOUNTER — Other Ambulatory Visit: Payer: Self-pay | Admitting: Family Medicine

## 2024-06-29 DIAGNOSIS — I10 Essential (primary) hypertension: Secondary | ICD-10-CM

## 2024-07-02 ENCOUNTER — Ambulatory Visit: Payer: Self-pay

## 2024-07-02 NOTE — Telephone Encounter (Signed)
-----   Message from Lauraine JAYSON Kanaris sent at 07/02/2024  8:55 AM EDT -----  1. Skin, central mid back :       DYSPLASTIC COMPOUND NEVUS WITH MODERATE ATYPIA, INFLAMED, CLOSE TO MARGIN, SEE       DESCRIPTION    Please notify patient with below plan: Your biopsy result showed an abnormal mole called a dysplastic nevus.  It is not abnormal enough that we need to do any further treatment, however if the mole were to return (even a little bit) then  we would need to treat it further with another biopsy or surgery.  Please let us  know if you notice this and we will also follow you during your regular skin check visits.  This mole in particular is  not necessarily a precursor to melanoma, but if you have a lot of these abnormal moles it does mean you have a higher risk of melanoma.    Below is some information from the Skin Cancer Foundation: ATYPICAL MOLES are unusual-looking benign (noncancerous) moles, also known as dysplastic nevi (the plural of nevus or mole). Atypical moles may resemble melanoma, and people who have them are at  increased risk of developing melanoma in a mole or elsewhere on the body. The higher the number of these moles someone has, the higher the risk. Those who have 10 or more have 12 times the risk of  developing melanoma compared with the general population.   Heredity appears to play a part in the formation of atypical moles. They tend to run in families, especially in Caucasians; about 2 to 8 percent of Caucasians have these moles. Those who have  atypical moles plus a family history of melanoma (two or more close blood relatives with the disease) have a very high risk of developing melanoma. People who have atypical moles, but no family  history of melanoma, are also at higher risk of developing melanoma compared with the general population. So are those with 50 or more normal moles. All of these high-risk individuals should practice  rigorous daily sun protection, perform a  monthly skin self-examination head to toe and seek regular professional skin exams.  ----- Message ----- From: Interface, Lab In Three Zero One Sent: 06/28/2024   6:39 PM EDT To: Lauraine JAYSON Kanaris, MD

## 2024-07-02 NOTE — Telephone Encounter (Signed)
 Patient informed of pathology results

## 2024-07-22 LAB — ALLERGEN PANEL (27) + IGE
Alternaria Alternata IgE: <0.1 kU/L
Aspergillus Fumigatus IgE: <0.1 kU/L
Bahia Grass IgE: <0.1 kU/L
Bermuda Grass IgE: <0.1 kU/L
Cat Dander IgE: <0.1 kU/L
Cedar, Mountain IgE: <0.1 kU/L
Cladosporium Herbarum IgE: <0.1 kU/L
Cocklebur IgE: <0.1 kU/L
Cockroach, American IgE: <0.1 kU/L
Common Silver Birch IgE: <0.1 kU/L
D Farinae IgE: <0.1 kU/L
D Pteronyssinus IgE: <0.1 kU/L
Dog Dander IgE: <0.1 kU/L
Elm, American IgE: <0.1 kU/L
Hickory, White IgE: <0.1 kU/L
IgE (Immunoglobulin E), Serum: 10 [IU]/mL (ref 6–495)
Johnson Grass IgE: <0.1 kU/L
Kentucky Bluegrass IgE: <0.1 kU/L
Maple/Box Elder IgE: <0.1 kU/L
Mucor Racemosus IgE: <0.1 kU/L
Oak, White IgE: <0.1 kU/L
Penicillium Chrysogen IgE: <0.1 kU/L
Pigweed, Rough IgE: <0.1 kU/L
Plantain, English IgE: <0.1 kU/L
Ragweed, Short IgE: <0.1 kU/L
Setomelanomma Rostrat: <0.1 kU/L
Timothy Grass IgE: <0.1 kU/L
White Mulberry IgE: <0.1 kU/L

## 2024-07-22 LAB — CBC WITH DIFFERENTIAL/PLATELET
Basophils Absolute: 0 x10E3/uL (ref 0.0–0.2)
Basos: 1 %
EOS (ABSOLUTE): 0.1 x10E3/uL (ref 0.0–0.4)
Eos: 1 %
Hematocrit: 43.2 % (ref 34.0–46.6)
Hemoglobin: 13.8 g/dL (ref 11.1–15.9)
Immature Grans (Abs): 0 x10E3/uL (ref 0.0–0.1)
Immature Granulocytes: 0 %
Lymphocytes Absolute: 2.8 x10E3/uL (ref 0.7–3.1)
Lymphs: 39 %
MCH: 26.4 pg — ABNORMAL LOW (ref 26.6–33.0)
MCHC: 31.9 g/dL (ref 31.5–35.7)
MCV: 83 fL (ref 79–97)
Monocytes Absolute: 0.4 x10E3/uL (ref 0.1–0.9)
Monocytes: 5 %
Neutrophils Absolute: 3.8 x10E3/uL (ref 1.4–7.0)
Neutrophils: 54 %
Platelets: 284 x10E3/uL (ref 150–450)
RBC: 5.22 x10E6/uL (ref 3.77–5.28)
RDW: 14 % (ref 11.7–15.4)
WBC: 7.2 x10E3/uL (ref 3.4–10.8)

## 2024-07-24 ENCOUNTER — Encounter: Payer: Self-pay | Admitting: Pulmonary Disease

## 2024-07-24 ENCOUNTER — Ambulatory Visit: Admitting: Pulmonary Disease

## 2024-07-24 VITALS — BP 120/62 | HR 88 | Temp 98.1°F | Ht 64.0 in | Wt 200.6 lb

## 2024-07-24 DIAGNOSIS — R0602 Shortness of breath: Secondary | ICD-10-CM

## 2024-07-24 DIAGNOSIS — J4540 Moderate persistent asthma, uncomplicated: Secondary | ICD-10-CM | POA: Diagnosis not present

## 2024-07-24 LAB — PULMONARY FUNCTION TEST
DL/VA % pred: 126 %
DL/VA: 5.39 ml/min/mmHg/L
DLCO unc % pred: 112 %
DLCO unc: 23.03 ml/min/mmHg
FEF 25-75 Post: 3.63 L/s
FEF 25-75 Pre: 3.68 L/s
FEF2575-%Change-Post: -1 %
FEF2575-%Pred-Post: 144 %
FEF2575-%Pred-Pre: 146 %
FEV1-%Change-Post: 2 %
FEV1-%Pred-Post: 96 %
FEV1-%Pred-Pre: 93 %
FEV1-Post: 2.55 L
FEV1-Pre: 2.48 L
FEV1FVC-%Change-Post: 0 %
FEV1FVC-%Pred-Pre: 111 %
FEV6-%Change-Post: 3 %
FEV6-%Pred-Post: 88 %
FEV6-%Pred-Pre: 85 %
FEV6-Post: 2.92 L
FEV6-Pre: 2.83 L
FEV6FVC-%Pred-Post: 103 %
FEV6FVC-%Pred-Pre: 103 %
FVC-%Change-Post: 3 %
FVC-%Pred-Post: 85 %
FVC-%Pred-Pre: 82 %
FVC-Post: 2.92 L
FVC-Pre: 2.83 L
Post FEV1/FVC ratio: 87 %
Post FEV6/FVC ratio: 100 %
Pre FEV1/FVC ratio: 88 %
Pre FEV6/FVC Ratio: 100 %
RV % pred: 84 %
RV: 1.61 L
TLC % pred: 88 %
TLC: 4.48 L

## 2024-07-24 NOTE — Patient Instructions (Signed)
 Full PFT completed today ? ?

## 2024-07-24 NOTE — Progress Notes (Signed)
 Full PFT completed today ? ?

## 2024-07-24 NOTE — Progress Notes (Signed)
 Synopsis: Referred in by [name], MD   Subjective:   PATIENT ID: Mackenzie Bailey GENDER: female DOB: 30-Mar-1966, MRN: [id number]  Chief Complaint  Patient presents with   Shortness of Breath    No SOB, wheezing or cough. Using Breztri  BID and it is helping. Albuterol  as needed.    HPI [name] is a pleasant 58 year old female patient with a past medical history of adult onset asthma, anxiety, hypertension, seasonal allergies presenting today to the pulmonary clinic to establish care.  She reports she was diagnosed with asthma about 6 years ago and saw a pulmonologist at [facility] clinic where she trialed multiple inhalers and none of them worked for her.  She eventually trialed Singulair  that seem to have relieved her symptoms.  She has been on Singulair  for years.  She reports that her asthma usually flares up in the fall and manifest as cough wheezing and chest tightness.  About 3 weeks ago she had an episode of sinusitis and asthma flare and was put on prednisone  which significantly improved her symptoms.  When she tapered off prednisone  her symptoms recurred and is currently finishing her third week of prednisone  taper.  She was never hospitalized for asthma.  She is usually hypersensitive to cold air and high humidity.  She does report allergic rhinitis but no eczema.  Family history -father with asthma  Social history -never smoked -drinks alcohol occasionally -Works at [employer] -lives at home with her husband and son.  Has only 1 dog at home.  OV 05/09/2024 - Ms. Steptoe is here for an acute visit. I started her on Symbicort  for moderate persistent asthma in June 2025. She did well initially however about 2 weeks ago after spending a long day outdoors at a golf tournament she started having worsening coughing spells and chest tightness. She presented to the ED and underwent a CTA chest that did not show any PE nor endobronchial lesions. I discussed with her that  this might be a flare of her asthma symptoms given recent intolerable heat and humidity and I will escalate treatment to triple inhaler therapy. I also prescribed her prednisone  for 5 days in case she needs to use it.   OV 10.07.2025 - Ms . Dobratz is here to follow up regarding her asthma. She is doing much better. Compliant with Breztri . PFTs today show normal results. No change to her maintenance regimen. I will see her back in 6 months.   ROS All symptoms were reviewed and are negative except for the above.  Objective:   Vitals:   07/24/24 1439  BP: 120/62  Pulse: 88  Temp: 98.1 F (36.7 C)  SpO2: 95%  Weight: 200 lb 9.6 oz (91 kg)  Height: 5' 4 (1.626 m)    95% on RA BMI Readings from Last 3 Encounters:  07/24/24 34.43 kg/m  07/24/24 34.43 kg/m  06/25/24 35.50 kg/m   Wt Readings from Last 3 Encounters:  07/24/24 200 lb 9.6 oz (91 kg)  07/24/24 200 lb 9.6 oz (91 kg)  06/25/24 206 lb 12.8 oz (93.8 kg)    Physical Exam GEN: NAD, Healthy Appearing HEENT: Supple Neck, Reactive Pupils, EOMI  CVS: Normal S1, Normal S2, RRR, No murmurs or ES appreciated  Lungs: Clear bilateral air entry.  Abdomen: Soft, non tender, non distended, + BS  Extremities: Warm and well perfused, No edema  Skin: No suspicious lesions appreciated  Psych: Normal Affect  Ancillary Information   CBC    Component Value  Date/Time   WBC 7.2 07/19/2024 0841   WBC 18.2 (H) 04/02/2024 1401   RBC 5.22 07/19/2024 0841   RBC 5.80 (H) 04/02/2024 1401   HGB 13.8 07/19/2024 0841   HCT 43.2 07/19/2024 0841   PLT 284 07/19/2024 0841   MCV 83 07/19/2024 0841   MCH 26.4 (L) 07/19/2024 0841   MCH 25.9 (L) 04/02/2024 1401   MCHC 31.9 07/19/2024 0841   MCHC 32.4 04/02/2024 1401   RDW 14.0 07/19/2024 0841   LYMPHSABS 2.8 07/19/2024 0841   EOSABS 0.1 07/19/2024 0841   BASOSABS 0.0 07/19/2024 0841    CTA chest 03/2024 - Negative for PE. Multiple small pulmonary nodules largest measuring 3mm.      No data  to display           Assessment & Plan:  [name] is a pleasant 57 year old female patient with a past medical history of adult onset asthma, anxiety, hypertension, seasonal allergies presenting today to the pulmonary clinic to establish care.  #Moderate persistent Asthma with asthma flare.  Dx 6 years ago. Sx include chest tightness, cough and wheezing. Flare in fall.  PFTs 07/2024 - Normal PFTs  FENO 10 negative for intrapulmonary eosinophilic inflammation.  EOS 100 [2025]  Allergen panel Negative.  ACT 24 (07/2024)  []  c/ budesonide -formoterol -glycopyrrolate  [Breztri  160-4.5 2puffs twice a day. Mouth rinse with water  after each use.  []  Levalbuterol  as needed.  []  C/w Singulair  10mg  1 tab daily.   RTC 6 months.   I personally spent a total of 30 minutes in the care of the patient today including preparing to see the patient, getting/reviewing separately obtained history, performing a medically appropriate exam/evaluation, counseling and educating, documenting clinical information in the EHR, and independently interpreting results.   Darrin Barn, MD [facility] 07/24/2024 3:21 PM

## 2024-07-27 ENCOUNTER — Encounter: Payer: Self-pay | Admitting: Family Medicine

## 2024-07-30 ENCOUNTER — Ambulatory Visit: Payer: Self-pay | Admitting: Family Medicine

## 2024-07-30 ENCOUNTER — Encounter: Payer: Self-pay | Admitting: Family Medicine

## 2024-07-30 NOTE — Progress Notes (Signed)
 Pt advised. Verbalized understanding.  Order signed by provider and placed on sorter to be faxed.

## 2024-07-30 NOTE — Progress Notes (Unsigned)
 Faxed form to [facility] @ [phone] on 07/30/24 and returned to nurses station.

## 2024-07-30 NOTE — Progress Notes (Unsigned)
 Sleep study shows AHI of 71.3, which means that every hour on average she stopped breathing or dropped her oxygen levels 71 times.  This corresponds to severe sleep apnea. Recommend autopap with mask of patient's choice and heated humidity and all necessary tubing and filters - Rx signed.   Please let patient know.  Tanmay Halteman, Jon HERO, MD, MPH [facility]

## 2024-08-09 ENCOUNTER — Telehealth: Payer: Self-pay

## 2024-08-09 NOTE — Telephone Encounter (Signed)
 Copied from CRM [id number]. Topic: Clinical - Order For Equipment >> Aug 08, 2024 11:28 AM Delon DASEN wrote: Reason for CRM: Patient  has  not heard from anyone about getting fitted for cpap- please call for follow up- [phone]- needs asap

## 2024-08-09 NOTE — Telephone Encounter (Signed)
 Sha-is this the one from yesterday?    Copied from CRM #[id number]. Topic: General - Call Back - No Documentation >> Aug 08, 2024 12:10 PM Roselie BROCKS wrote: Reason for CRM: [facility] called concerning a referral for the patient sent to them, and is requesting a call back as soon as possible  Phone number [phone]

## 2024-08-10 ENCOUNTER — Telehealth: Payer: Self-pay

## 2024-08-10 NOTE — Telephone Encounter (Signed)
 faxed

## 2024-08-10 NOTE — Telephone Encounter (Signed)
 Copied from CRM [id number]. Topic: Clinical - Prescription Issue >> Aug 10, 2024  2:05 PM Delon DASEN wrote: Reason for CRM: Stacie with [facility], no in network with patient insurance, will have to get the cpap elsewhere- [phone]

## 2024-08-13 NOTE — Telephone Encounter (Signed)
 Copied from CRM [id number]. Topic: Clinical - Prescription Issue >> Aug 10, 2024  2:05 PM Delon DASEN wrote: Reason for CRM: Stacie with [facility], no in network with patient insurance, will have to get the cpap elsewhere- [phone]

## 2024-08-13 NOTE — Telephone Encounter (Signed)
 Encounter signed and closed. Information added to original encounter

## 2024-10-22 ENCOUNTER — Ambulatory Visit: Admitting: Family Medicine

## 2024-10-22 ENCOUNTER — Encounter: Payer: Self-pay | Admitting: Family Medicine

## 2024-10-22 VITALS — BP 136/80 | HR 100 | Resp 16 | Ht 64.0 in | Wt 205.5 lb

## 2024-10-22 DIAGNOSIS — Z6835 Body mass index (BMI) 35.0-35.9, adult: Secondary | ICD-10-CM | POA: Diagnosis not present

## 2024-10-22 DIAGNOSIS — G4733 Obstructive sleep apnea (adult) (pediatric): Secondary | ICD-10-CM | POA: Insufficient documentation

## 2024-10-22 DIAGNOSIS — E559 Vitamin D deficiency, unspecified: Secondary | ICD-10-CM | POA: Diagnosis not present

## 2024-10-22 DIAGNOSIS — N951 Menopausal and female climacteric states: Secondary | ICD-10-CM

## 2024-10-22 DIAGNOSIS — E66812 Obesity, class 2: Secondary | ICD-10-CM | POA: Diagnosis not present

## 2024-10-22 DIAGNOSIS — E1159 Type 2 diabetes mellitus with other circulatory complications: Secondary | ICD-10-CM | POA: Diagnosis not present

## 2024-10-22 DIAGNOSIS — R454 Irritability and anger: Secondary | ICD-10-CM

## 2024-10-22 DIAGNOSIS — L988 Other specified disorders of the skin and subcutaneous tissue: Secondary | ICD-10-CM | POA: Diagnosis not present

## 2024-10-22 DIAGNOSIS — I152 Hypertension secondary to endocrine disorders: Secondary | ICD-10-CM | POA: Diagnosis not present

## 2024-10-22 DIAGNOSIS — E1169 Type 2 diabetes mellitus with other specified complication: Secondary | ICD-10-CM

## 2024-10-22 DIAGNOSIS — E119 Type 2 diabetes mellitus without complications: Secondary | ICD-10-CM | POA: Insufficient documentation

## 2024-10-22 MED ORDER — VITAMIN D (ERGOCALCIFEROL) 1.25 MG (50000 UNIT) PO CAPS: 50000.0000 [IU] | ORAL_CAPSULE | ORAL | 3 refills | Status: AC

## 2024-10-22 MED ORDER — ZEPBOUND 5 MG/0.5ML ~~LOC~~ SOAJ: 5.0000 mg | SUBCUTANEOUS | 2 refills | Status: DC

## 2024-10-22 MED ORDER — OSELTAMIVIR PHOSPHATE 75 MG PO CAPS: 75.0000 mg | ORAL_CAPSULE | Freq: Two times a day (BID) | ORAL | 0 refills | Status: AC

## 2024-10-22 MED ORDER — TIRZEPATIDE 2.5 MG/0.5ML ~~LOC~~ SOAJ: 2.5000 mg | SUBCUTANEOUS | 0 refills | Status: AC

## 2024-10-22 MED ORDER — ESTRADIOL 0.025 MG/24HR TD PTWK: 0.0250 mg | MEDICATED_PATCH | TRANSDERMAL | 12 refills | Status: AC

## 2024-10-22 MED ORDER — TIRZEPATIDE 5 MG/0.5ML ~~LOC~~ SOAJ: 5.0000 mg | SUBCUTANEOUS | 2 refills | Status: AC

## 2024-10-22 MED ORDER — TRETINOIN 0.025 % EX CREA: TOPICAL_CREAM | Freq: Every day | CUTANEOUS | 2 refills | Status: AC

## 2024-10-22 MED ORDER — ZEPBOUND 2.5 MG/0.5ML ~~LOC~~ SOAJ: 2.5000 mg | SUBCUTANEOUS | 0 refills | Status: DC

## 2024-10-22 NOTE — Assessment & Plan Note (Signed)
 Managed with CPAP, which she reports is effective but uncomfortable. Discussed potential improvement with weight loss. Mounjaro  may aid in weight loss, potentially reducing sleep apnea severity. - Continue CPAP therapy. - Submitted prior authorization for Mounjaro  (tirzepatide ) for weight loss, which may improve sleep apnea.

## 2024-10-22 NOTE — Assessment & Plan Note (Signed)
 Recent A1c of 6.6%, indicating progression from prediabetes to diabetes. Discussed the potential for Mounjaro  (tirzepatide ) to aid in weight loss and improve glycemic control. Insurance coverage for Mounjaro  is likely due to diabetes diagnosis. Discussed potential side effects, including nausea, and strategies to mitigate them. Consideration of metformin as a prior requirement by insurance, with strategies to manage gastrointestinal side effects. - If Mounjaro  is approved, initiate with 2.5 mg once weekly for one month, then increase to 5 mg weekly. May need PA - If metformin is required by insurance, will consider trial with food to minimize gastrointestinal side effects. Though with chronic diarrhea,s/p cholecystectomy, she is not a good metformin candidate. - Monitor A1c levels to assess glycemic control.

## 2024-10-22 NOTE — Assessment & Plan Note (Signed)
 Managed with prescription vitamin D . She reports perceived difference with prescription versus over-the-counter vitamin D , though this may be placebo effect. - Refilled prescription vitamin D .

## 2024-10-22 NOTE — Assessment & Plan Note (Signed)
 BMI >35 and assoc with T2DM, Contributing to obstructive sleep apnea and knee pain. Previous use of phentermine  without adverse effects. Discussed potential use of Mounjaro  for weight loss, which may also aid in sleep apnea management. Insurance coverage for Mounjaro  is likely due to diabetes diagnosis. Discussed lifestyle modifications to mitigate side effects of Mounjaro , such as nausea. - If Mounjaro  is approved, initiate with 2.5 mg once weekly for one month, then increase to 5 mg weekly. - If Mounjaro  is not approved, will consider phentermine  for short-term weight loss. - Encouraged lifestyle modifications to support weight loss, including dietary changes and portion control.

## 2024-10-22 NOTE — Progress Notes (Signed)
 "     Established patient visit   Patient: Mackenzie Bailey   DOB: Feb 15, 1966   59 y.o. Female  MRN: [id number] Visit Date: 10/22/2024  Today's healthcare provider: Jon Eva, MD   Chief Complaint  Patient presents with   Acute Visit    Weight- pt reports struggling with weight for 15 years trying and unable too loose. Has tried phentermine  and would like to go back on it. Per pt insurance does not cover any other weight loss medication, its been years since she last had phentermine .  Menopause- a lot of irritability    Subjective    HPI HPI     Acute Visit    Additional comments: Weight- pt reports struggling with weight for 15 years trying and unable too loose. Has tried phentermine  and would like to go back on it. Per pt insurance does not cover any other weight loss medication, its been years since she last had phentermine .  Menopause- a lot of irritability       Last edited by Wilfred Hargis RAMAN, CMA on 10/22/2024  3:26 PM.       Discussed the use of AI scribe software for clinical note transcription with the patient, who gave verbal consent to proceed.  History of Present Illness   Mackenzie Bailey is a 59 year old female with sleep apnea and obesity who presents with concerns about weight management and menopause symptoms.  She uses CPAP for sleep apnea and is not falling asleep during the day, but still feels tired and low energy. She dislikes the CPAP mask and takes Nyquil nightly to help her sleep, and she is concerned this may worsen her blood pressure.  She is concerned that her weight is worsening her knee pain and fatigue. Since prednisone  for asthma last summer she has not been able to get below 200 lbs, with prior weights 180-193 lbs. She previously tolerated phentermine  without adverse effects and wants to consider it again, but her insurance does not cover some weight loss medications.  She has bothersome menopause symptoms, mainly significant  irritability, which she thinks may be contributing to elevated blood pressure. Hot flashes have improved. She has not used menopause medications before and is considering hormone replacement therapy. She had a hysterectomy with ovaries retained.  She had a cholecystectomy and is worried about vitamin absorption, especially vitamin D . She feels prescription vitamin D  works better than over-the-counter. She takes Cymbalta , which helps her fibromyalgia and irritability. She previously tried Wellbutrin  for weight loss without benefit.  She has asthma and works in teacher, music. She is worried about getting the flu and asks for Tamiflu  to have available if she tests positive, due to concern about triggering asthma.           06/25/2024    3:32 PM 04/10/2024    2:26 PM 03/22/2024    1:58 PM 12/16/2023    8:10 AM 06/23/2023    1:31 PM  Depression screen PHQ 2/9  Decreased Interest 1 2 1  0 0  Down, Depressed, Hopeless 0 1 1 0 0  PHQ - 2 Score 1 3 2  0 0  Altered sleeping 2 2 2 2 3   Tired, decreased energy 3 1 3 2 3   Change in appetite 3 1 0 3 2  Feeling bad or failure about yourself  0 0 1 0 0  Trouble concentrating 2 0 0 0 2  Moving slowly or fidgety/restless 0 0 0 0 0  Suicidal thoughts 0 0  0 0 0  PHQ-9 Score 11  7  8  7  10    Difficult doing work/chores Somewhat difficult Not difficult at all Not difficult at all Not difficult at all Somewhat difficult     Data saved with a previous flowsheet row definition       06/25/2024    3:32 PM 04/10/2024    2:28 PM 03/22/2024    1:59 PM 12/16/2023    8:10 AM  GAD 7 : Generalized Anxiety Score  Nervous, Anxious, on Edge 1 0 1 0  Control/stop worrying 0 0 0 0  Worry too much - different things 0 0 1 1  Trouble relaxing 1 0 0 1  Restless 0 0 0 0  Easily annoyed or irritable 2 1 0 1  Afraid - awful might happen 1 0 1 1  Total GAD 7 Score 5 1 3 4   Anxiety Difficulty Somewhat difficult Not difficult at all Not difficult at all Not difficult at all      Medications: Show/hide medication list[1]  Review of Systems     Objective    BP 136/80   Pulse 100   Resp 16   Ht 5' 4 (1.626 m)   Wt 205 lb 8 oz (93.2 kg)   LMP 05/09/2019   SpO2 96%   BMI 35.27 kg/m    Physical Exam Vitals reviewed.  Constitutional:      General: She is not in acute distress.    Appearance: Normal appearance. She is well-developed. She is not diaphoretic.  HENT:     Head: Normocephalic and atraumatic.  Eyes:     General: No scleral icterus.    Conjunctiva/sclera: Conjunctivae normal.  Neck:     Thyroid: No thyromegaly.  Cardiovascular:     Rate and Rhythm: Normal rate and regular rhythm.     Heart sounds: Normal heart sounds. No murmur heard. Pulmonary:     Effort: Pulmonary effort is normal. No respiratory distress.     Breath sounds: Normal breath sounds. No wheezing, rhonchi or rales.  Musculoskeletal:     Cervical back: Neck supple.     Right lower leg: No edema.     Left lower leg: No edema.  Lymphadenopathy:     Cervical: No cervical adenopathy.  Skin:    General: Skin is warm and dry.     Findings: No rash.  Neurological:     Mental Status: She is alert and oriented to person, place, and time. Mental status is at baseline.  Psychiatric:        Mood and Affect: Mood normal.        Behavior: Behavior normal.      No results found for any visits on 10/22/24.  Assessment & Plan     Problem List Items Addressed This Visit       Cardiovascular and Mediastinum   Essential hypertension   Improved by end of visit No changes today      Menopausal hot flushes   Menopausal symptoms Including irritability and hot flashes. Discussed hormone replacement therapy (HRT) with estrogen patch to alleviate symptoms. Estrogen therapy may also provide cardiovascular and bone health benefits. Discussed non-hormonal options, including augmentation of Cymbalta  with Wellbutrin . - Prescribed estrogen patch (0.025 mg) to be applied  weekly. - Monitor response to estrogen therapy over several months. - Continue Cymbalta  for fibromyalgia and mood stabilization.        Respiratory   OSA on CPAP   Managed with CPAP, which she  reports is effective but uncomfortable. Discussed potential improvement with weight loss. Mounjaro  may aid in weight loss, potentially reducing sleep apnea severity. - Continue CPAP therapy. - Submitted prior authorization for Mounjaro  (tirzepatide ) for weight loss, which may improve sleep apnea.        Endocrine   Diabetes mellitus (HCC) - Primary   Recent A1c of 6.6%, indicating progression from prediabetes to diabetes. Discussed the potential for Mounjaro  (tirzepatide ) to aid in weight loss and improve glycemic control. Insurance coverage for Mounjaro  is likely due to diabetes diagnosis. Discussed potential side effects, including nausea, and strategies to mitigate them. Consideration of metformin as a prior requirement by insurance, with strategies to manage gastrointestinal side effects. - If Mounjaro  is approved, initiate with 2.5 mg once weekly for one month, then increase to 5 mg weekly. May need PA - If metformin is required by insurance, will consider trial with food to minimize gastrointestinal side effects. Though with chronic diarrhea,s/p cholecystectomy, she is not a good metformin candidate. - Monitor A1c levels to assess glycemic control.      Relevant Medications   tirzepatide  (MOUNJARO ) 2.5 MG/0.5ML Pen   tirzepatide  (MOUNJARO ) 5 MG/0.5ML Pen     Other   Morbid obesity (HCC)   BMI >35 and assoc with T2DM, Contributing to obstructive sleep apnea and knee pain. Previous use of phentermine  without adverse effects. Discussed potential use of Mounjaro  for weight loss, which may also aid in sleep apnea management. Insurance coverage for Mounjaro  is likely due to diabetes diagnosis. Discussed lifestyle modifications to mitigate side effects of Mounjaro , such as nausea. - If Mounjaro  is  approved, initiate with 2.5 mg once weekly for one month, then increase to 5 mg weekly. - If Mounjaro  is not approved, will consider phentermine  for short-term weight loss. - Encouraged lifestyle modifications to support weight loss, including dietary changes and portion control.      Relevant Medications   tirzepatide  (MOUNJARO ) 2.5 MG/0.5ML Pen   tirzepatide  (MOUNJARO ) 5 MG/0.5ML Pen   Avitaminosis D   Managed with prescription vitamin D . She reports perceived difference with prescription versus over-the-counter vitamin D , though this may be placebo effect. - Refilled prescription vitamin D .       Relevant Medications   Vitamin D , Ergocalciferol , (DRISDOL ) 1.25 MG (50000 UNIT) CAPS capsule   Other Visit Diagnoses       Elastosis of skin       Relevant Medications   tretinoin  (RETIN-A ) 0.025 % cream     Irritability            Return in about 2 months (around 12/20/2024) for chronic disease f/u.     I personally spent a total of 46 minutes in the care of the patient today including preparing to see the patient, getting/reviewing separately obtained history, performing a medically appropriate exam/evaluation, counseling and educating, placing orders, documenting clinical information in the EHR, independently interpreting results, and coordinating care.    Jon Eva, MD  [facility] [phone] (phone) [phone] (fax)  [facility]     [1]  Outpatient Medications Prior to Visit  Medication Sig   albuterol  (PROVENTIL ) (2.5 MG/3ML) 0.083% nebulizer solution Take 3 mLs (2.5 mg total) by nebulization every 6 (six) hours as needed for wheezing or shortness of breath.   albuterol  (VENTOLIN  HFA) 108 (90 Base) MCG/ACT inhaler Inhale 2 puffs into the lungs every 6 (six) hours as needed for wheezing or shortness of breath.   albuterol  (VENTOLIN  HFA) 108 (90 Base)  MCG/ACT inhaler Inhale 2 puffs into the lungs every 6 (six) hours as  needed for wheezing or shortness of breath.   ALPRAZolam  (XANAX ) 1 MG tablet TAKE 1 TABLET BY MOUTH DAILY AS NEEDED FOR ANXIETY   amLODipine  (NORVASC ) 5 MG tablet TAKE 1 TABLET (5 MG TOTAL) BY MOUTH DAILY.   budesonide -glycopyrrolate -formoterol  (BREZTRI  AEROSPHERE) 160-9-4.8 MCG/ACT AERO inhaler Inhale 2 puffs into the lungs in the morning and at bedtime.   cholecalciferol (VITAMIN D3) 25 MCG (1000 UNIT) tablet Take 1,000 Units by mouth daily.   colesevelam (WELCHOL) 625 MG tablet Take 625 mg by mouth.   cyclobenzaprine  (FLEXERIL ) 5 MG tablet TAKE 1 TABLET BY MOUTH THREE TIMES A DAY AS NEEDED FOR MUSCLE SPASMS   diphenhydramine-acetaminophen  (TYLENOL  PM) 25-500 MG TABS tablet Take 1-2 tablets by mouth at bedtime as needed (sleep).   DULoxetine  (CYMBALTA ) 60 MG capsule TAKE 1 CAPSULE BY MOUTH EVERY DAY   esomeprazole (NEXIUM) 20 MG capsule Take 20 mg by mouth daily as needed (heart burn).   ipratropium (ATROVENT ) 0.02 % nebulizer solution Take 2.5 mLs (0.5 mg total) by nebulization every 6 (six) hours as needed for wheezing or shortness of breath.   meclizine  (ANTIVERT ) 25 MG tablet Take 1 tablet (25 mg total) by mouth 3 (three) times daily as needed for dizziness.   montelukast  (SINGULAIR ) 10 MG tablet TAKE 1 TABLET BY MOUTH EVERYDAY AT BEDTIME   ondansetron  (ZOFRAN ) 4 MG tablet Take 1 tablet (4 mg total) by mouth every 8 (eight) hours as needed for nausea or vomiting.   oxybutynin  (DITROPAN -XL) 5 MG 24 hr tablet TAKE 1 TABLET BY MOUTH EVERYDAY AT BEDTIME   traZODone  (DESYREL ) 50 MG tablet TAKE 0.5-1 TABLETS (25-50 MG TOTAL) BY MOUTH AT BEDTIME AS NEEDED. FOR SLEEP   [DISCONTINUED] tretinoin  (RETIN-A ) 0.025 % cream APPLY TO AFFECTED AREA EVERY DAY AT BEDTIME   [DISCONTINUED] Vitamin D , Ergocalciferol , (DRISDOL ) 1.25 MG (50000 UNIT) CAPS capsule Take 1 capsule (50,000 Units total) by mouth every 7 (seven) days.   No facility-administered medications prior to visit.   "

## 2024-10-22 NOTE — Assessment & Plan Note (Signed)
 Improved by end of visit No changes today

## 2024-10-22 NOTE — Assessment & Plan Note (Signed)
 Menopausal symptoms Including irritability and hot flashes. Discussed hormone replacement therapy (HRT) with estrogen patch to alleviate symptoms. Estrogen therapy may also provide cardiovascular and bone health benefits. Discussed non-hormonal options, including augmentation of Cymbalta  with Wellbutrin . - Prescribed estrogen patch (0.025 mg) to be applied weekly. - Monitor response to estrogen therapy over several months. - Continue Cymbalta  for fibromyalgia and mood stabilization.

## 2024-10-29 ENCOUNTER — Telehealth: Payer: Self-pay

## 2024-10-29 ENCOUNTER — Other Ambulatory Visit (HOSPITAL_COMMUNITY): Payer: Self-pay

## 2024-10-29 NOTE — Telephone Encounter (Signed)
 Pharmacy Patient Advocate Encounter   Received notification from Physician's Office that prior authorization for MOUNJARO  is required/requested.   Insurance verification completed.    Per test claim: PA required; PA submitted to above mentioned insurance via Prompt PA Key/confirmation #/[id number] Status is pending

## 2024-10-29 NOTE — Telephone Encounter (Signed)
 ERROR

## 2024-10-29 NOTE — Telephone Encounter (Signed)
 Copied from CRM [id number]. Topic: Clinical - Medication Prior Auth >> Oct 29, 2024  2:34 PM Everette C wrote: Reason for CRM: The patient has been directed by their Pharmacy to contact their PCP and request completion of prior authorization for their prescription of tirzepatide  (MOUNJARO ) 5 MG/0.5ML Pen [[id number]]. Please contact further when possible

## 2024-11-01 NOTE — Telephone Encounter (Signed)
 Copied from CRM [id number]. Topic: Clinical - Medication Prior Auth >> Nov 01, 2024  9:22 AM Avram MATSU wrote: Patient called to follow regarding her PA status, please advise [phone]

## 2024-11-02 ENCOUNTER — Other Ambulatory Visit (HOSPITAL_COMMUNITY): Payer: Self-pay

## 2024-11-02 NOTE — Telephone Encounter (Signed)
 Pharmacy Patient Advocate Encounter  Received notification from [employer] that Prior Authorization for MOUNJARO  has been APPROVED from 10/30/24 to 10/30/25. Ran test claim, Copay is $170.64 WITH [employer]. This test claim was processed through [facility]- copay amounts may vary at other pharmacies due to pharmacy/plan contracts, or as the patient moves through the different stages of their insurance plan.

## 2024-11-02 NOTE — Telephone Encounter (Signed)
Please see details below:

## 2024-11-15 ENCOUNTER — Ambulatory Visit
Admission: RE | Admit: 2024-11-15 | Discharge: 2024-11-15 | Disposition: A | Discharge status: Home / Self Care | Attending: Ophthalmology | Admitting: Ophthalmology

## 2024-11-15 ENCOUNTER — Other Ambulatory Visit
Admission: RE | Admit: 2024-11-15 | Discharge: 2024-11-15 | Disposition: A | Discharge status: Home / Self Care | Source: Home / Self Care | Attending: Ophthalmology | Admitting: Ophthalmology

## 2024-11-15 ENCOUNTER — Ambulatory Visit
Admission: RE | Admit: 2024-11-15 | Discharge: 2024-11-15 | Disposition: A | Discharge status: Home / Self Care | Source: Ambulatory Visit | Attending: Ophthalmology | Admitting: Ophthalmology

## 2024-11-15 ENCOUNTER — Other Ambulatory Visit: Payer: Self-pay | Admitting: Ophthalmology

## 2024-11-15 DIAGNOSIS — H209 Unspecified iridocyclitis: Secondary | ICD-10-CM

## 2024-11-15 LAB — CBC WITH DIFFERENTIAL/PLATELET
Abs Immature Granulocytes: 0.02 10*3/uL (ref 0.00–0.07)
Basophils Absolute: 0.1 10*3/uL (ref 0.0–0.1)
Basophils Relative: 1 %
Eosinophils Absolute: 0.1 10*3/uL (ref 0.0–0.5)
Eosinophils Relative: 2 %
HCT: 40.2 % (ref 36.0–46.0)
Hemoglobin: 13 g/dL (ref 12.0–15.0)
Immature Granulocytes: 0 %
Lymphocytes Relative: 30 %
Lymphs Abs: 2 10*3/uL (ref 0.7–4.0)
MCH: 25.9 pg — ABNORMAL LOW (ref 26.0–34.0)
MCHC: 32.3 g/dL (ref 30.0–36.0)
MCV: 80.1 fL (ref 80.0–100.0)
Monocytes Absolute: 0.4 10*3/uL (ref 0.1–1.0)
Monocytes Relative: 6 %
Neutro Abs: 4 10*3/uL (ref 1.7–7.7)
Neutrophils Relative %: 61 %
Platelets: 284 10*3/uL (ref 150–400)
RBC: 5.02 MIL/uL (ref 3.87–5.11)
RDW: 14.9 % (ref 11.5–15.5)
WBC: 6.6 10*3/uL (ref 4.0–10.5)
nRBC: 0 % (ref 0.0–0.2)

## 2024-11-15 LAB — SEDIMENTATION RATE: Sed Rate: 8 mm/h (ref 0–30)

## 2024-11-16 LAB — ANCA TITERS
Atypical P-ANCA titer: <1:20 {titer}
C-ANCA: <1:20 {titer}
P-ANCA: <1:20 {titer}

## 2024-11-16 LAB — ANA: Anti Nuclear Antibody (ANA): NEGATIVE

## 2024-11-16 LAB — SYPHILIS: RPR W/REFLEX TO RPR TITER AND TREPONEMAL ANTIBODIES, TRADITIONAL SCREENING AND DIAGNOSIS ALGORITHM: RPR Ser Ql: NONREACTIVE

## 2024-11-16 LAB — ANGIOTENSIN CONVERTING ENZYME: Angiotensin-Converting Enzyme: 28 U/L (ref 14–82)

## 2024-11-16 LAB — RHEUMATOID FACTOR: Rheumatoid fact SerPl-aCnc: <10 [IU]/mL

## 2024-11-19 LAB — QUANTIFERON-TB GOLD PLUS (RQFGPL)
QuantiFERON Mitogen Value: >10 [IU]/mL
QuantiFERON Nil Value: 0.3 [IU]/mL
QuantiFERON TB1 Ag Value: 0.24 [IU]/mL
QuantiFERON TB2 Ag Value: 0.27 [IU]/mL

## 2024-11-19 LAB — QUANTIFERON-TB GOLD PLUS: QuantiFERON-TB Gold Plus: NEGATIVE

## 2024-11-20 LAB — HLA-B27 ANTIGEN: HLA-B27: NEGATIVE

## 2024-12-18 ENCOUNTER — Ambulatory Visit: Admitting: Family Medicine

## 2025-07-03 ENCOUNTER — Ambulatory Visit
